# Patient Record
Sex: Female | Born: 1970 | Race: White | Hispanic: No | Marital: Single | Smoking: Former smoker
Health system: Southern US, Community
[De-identification: ages and names within clinical notes are randomized; demographics above are authoritative.]

## PROBLEM LIST (undated history)

## (undated) DIAGNOSIS — I10 Essential (primary) hypertension: Secondary | ICD-10-CM

## (undated) DIAGNOSIS — E119 Type 2 diabetes mellitus without complications: Secondary | ICD-10-CM

## (undated) HISTORY — PX: TONSILLECTOMY: SUR1361

---

## 2010-03-27 ENCOUNTER — Emergency Department: Payer: Self-pay | Admitting: Emergency Medicine

## 2010-04-10 ENCOUNTER — Ambulatory Visit: Payer: Self-pay | Admitting: Internal Medicine

## 2010-04-17 ENCOUNTER — Ambulatory Visit: Payer: Self-pay | Admitting: Gastroenterology

## 2010-04-18 ENCOUNTER — Ambulatory Visit: Payer: Self-pay | Admitting: Gastroenterology

## 2010-04-19 ENCOUNTER — Ambulatory Visit: Payer: Self-pay | Admitting: Gastroenterology

## 2010-04-26 ENCOUNTER — Ambulatory Visit: Payer: Self-pay | Admitting: Gastroenterology

## 2011-06-25 ENCOUNTER — Ambulatory Visit: Payer: Self-pay | Admitting: Unknown Physician Specialty

## 2013-02-16 IMAGING — CR PARANASAL SINUSES - COMPLETE 3 + VIEW
1 series · 6 of 6 positions shown · non-contrast
Comparison: none

REASON FOR EXAM: acute max
COMMENTS:

[Series 1: view not recorded · 0.17mm/px · 6 of 6 slices shown]
[im 1/6]
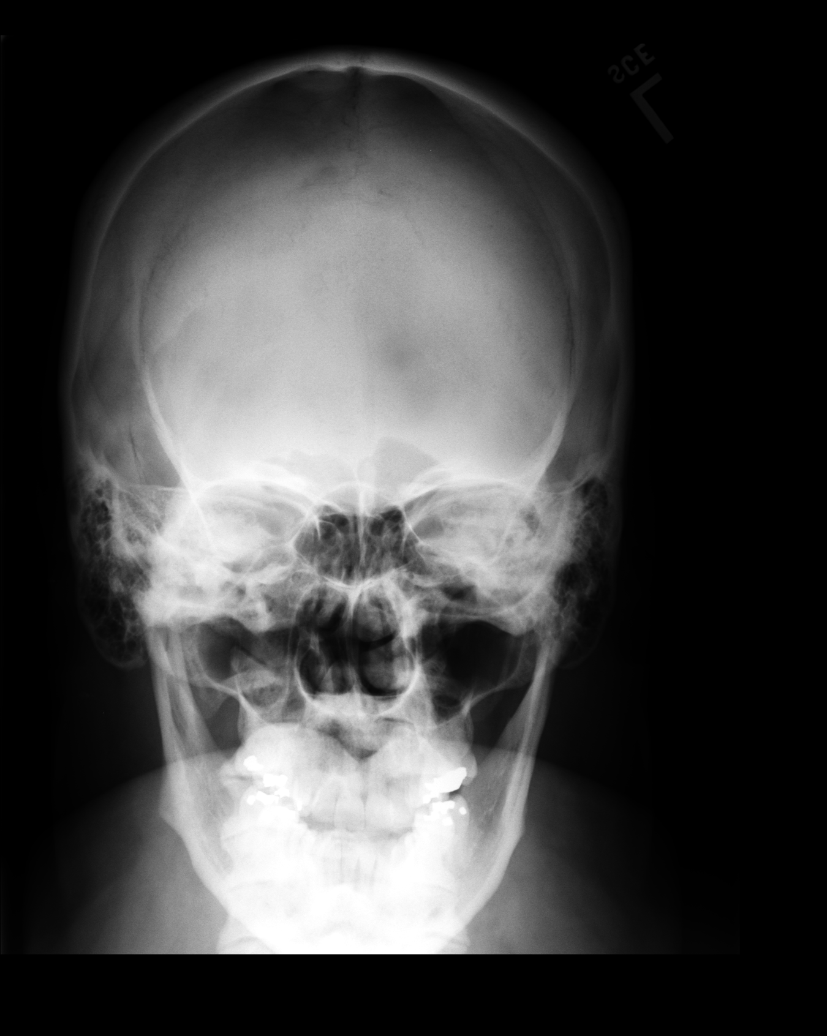
[im 2/6]
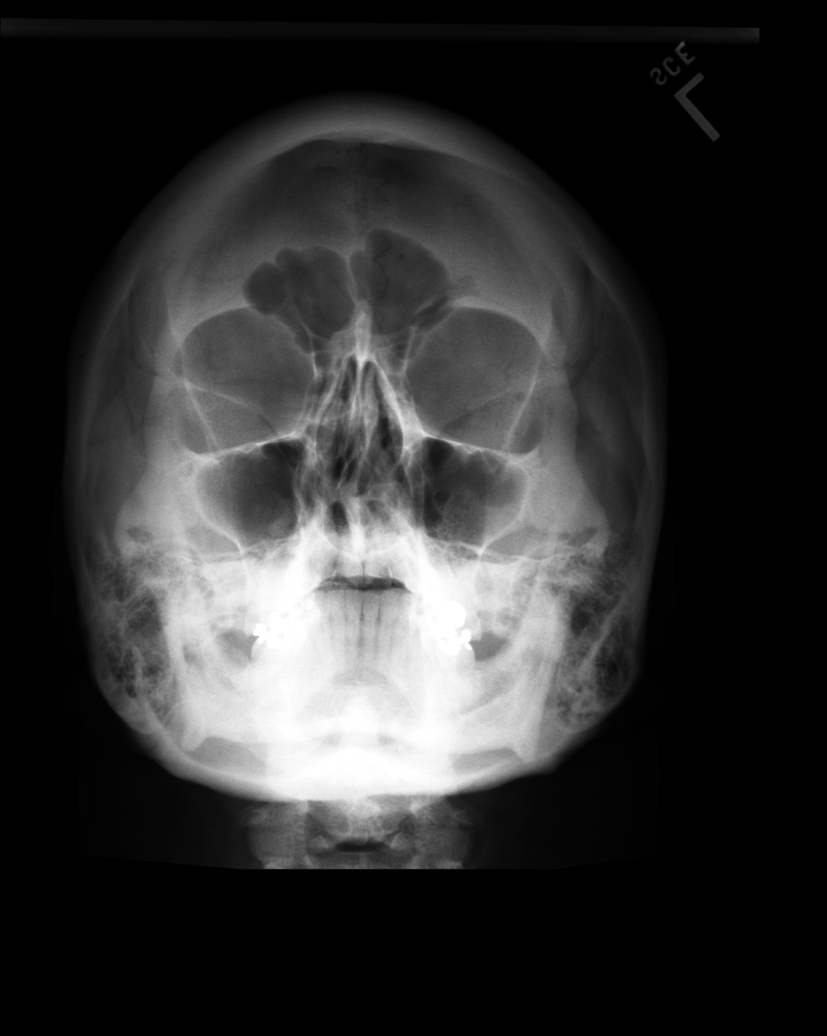
[im 3/6]
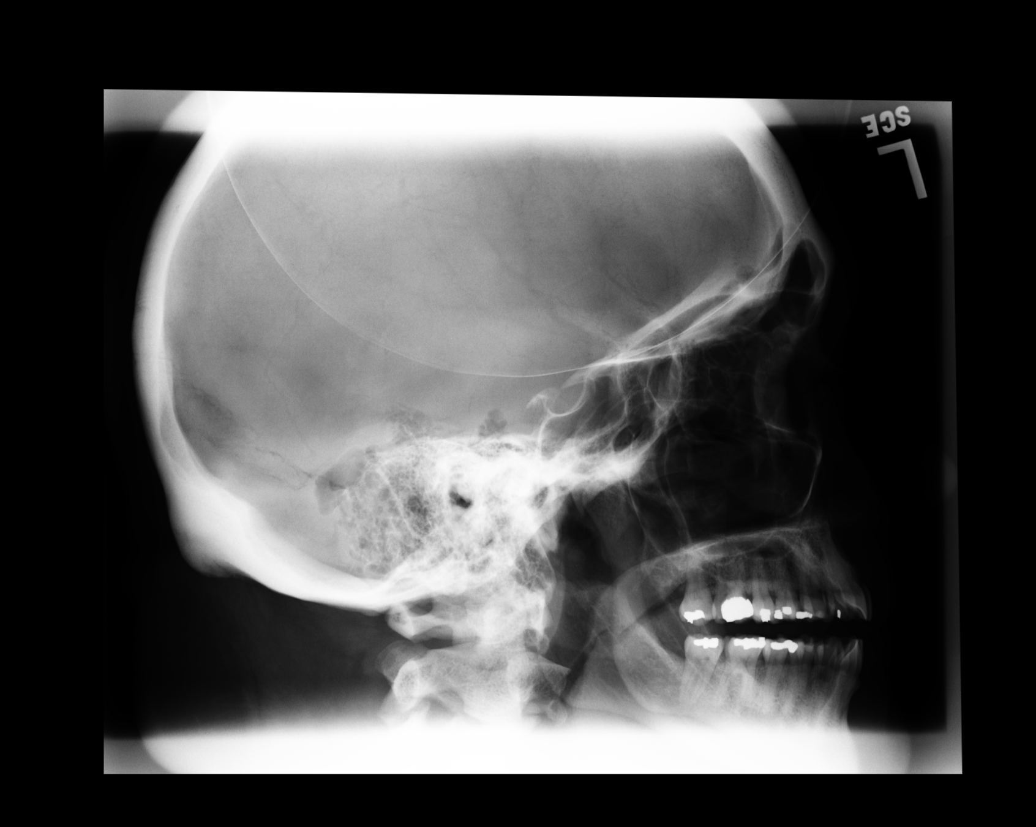
[im 4/6]
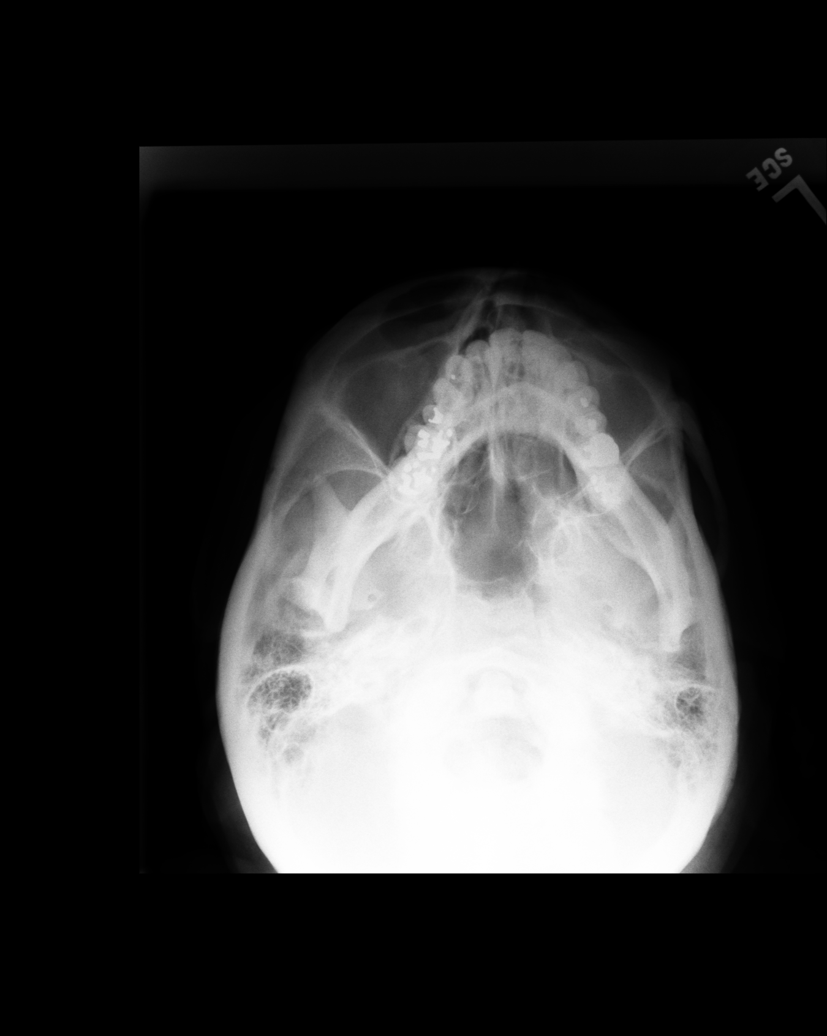
[im 5/6]
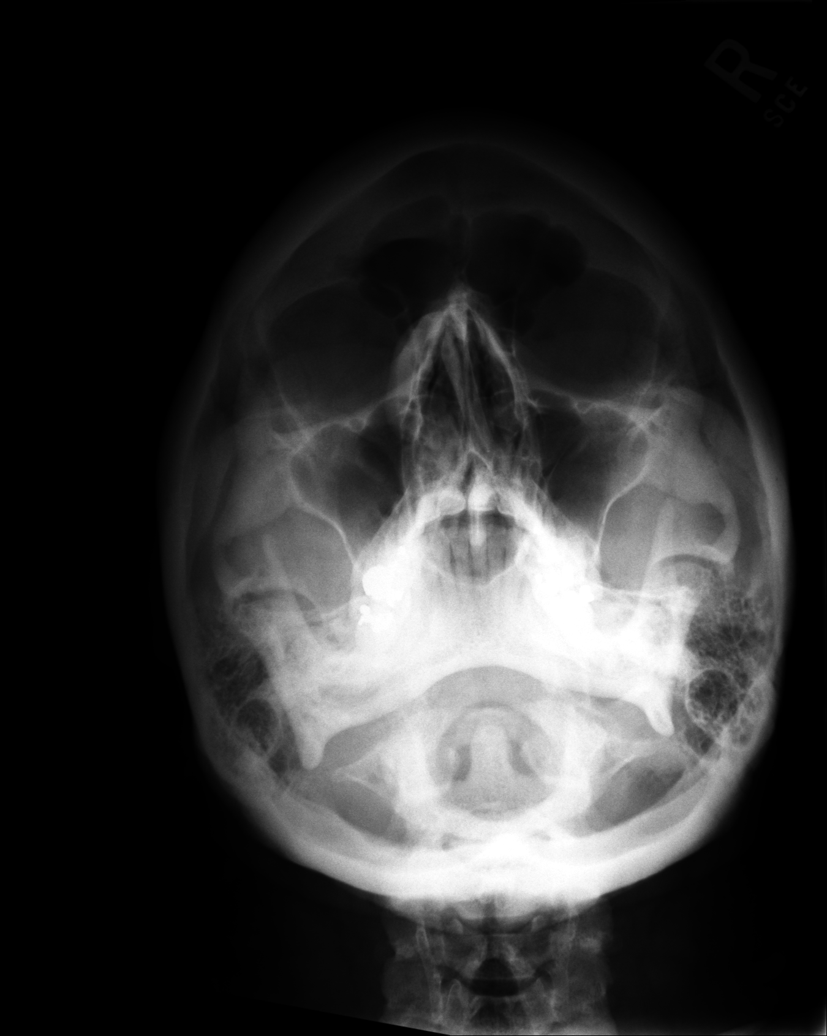
[im 6/6]
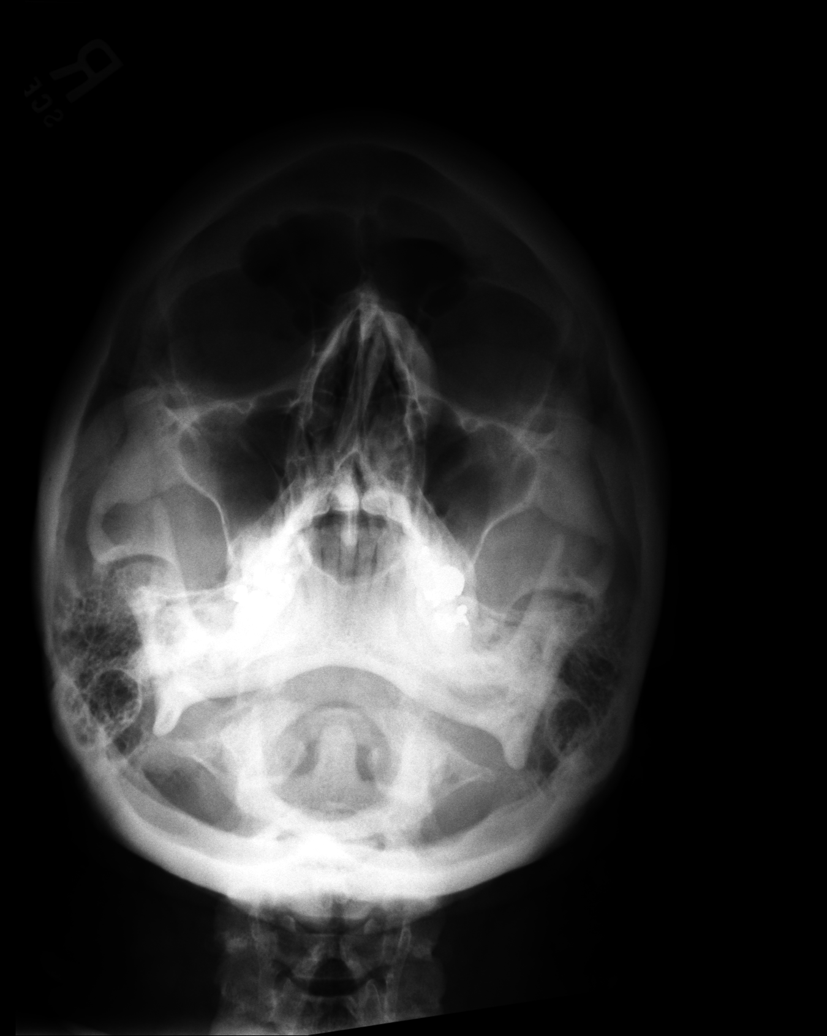

[6 of 6 positions shown; findings below may reference images not displayed]

PROCEDURE:     DXR - DXR SINUSES PARANASAL COMPLETE  - June 25, 2011  [DATE]

RESULT:     There is mild nasal septal deviation toward the right. No
air-fluid level is seen within the paranasal sinuses. There is no
significant mucosal thickening evident. No bony destruction is demonstrated.
The mastoid air cells show grossly normal aeration.
IMPRESSION: 1. No radiographic evidence of acute sinusitis.

## 2022-09-15 ENCOUNTER — Encounter
Admission: EM | Disposition: A | Discharge status: Other Acute Inpatient Hospital | Payer: Self-pay | Source: Home / Self Care | Attending: Pulmonary Disease

## 2022-09-15 ENCOUNTER — Other Ambulatory Visit: Payer: Self-pay

## 2022-09-15 ENCOUNTER — Inpatient Hospital Stay
Admission: EM | Admit: 2022-09-15 | Discharge: 2022-09-16 | DRG: 853 | Disposition: A | Discharge status: Other Acute Inpatient Hospital | Payer: Self-pay | Attending: Pulmonary Disease | Admitting: Pulmonary Disease

## 2022-09-15 ENCOUNTER — Emergency Department: Payer: Self-pay

## 2022-09-15 DIAGNOSIS — L02214 Cutaneous abscess of groin: Secondary | ICD-10-CM | POA: Diagnosis present

## 2022-09-15 DIAGNOSIS — R6520 Severe sepsis without septic shock: Secondary | ICD-10-CM | POA: Diagnosis present

## 2022-09-15 DIAGNOSIS — M726 Necrotizing fasciitis: Secondary | ICD-10-CM

## 2022-09-15 DIAGNOSIS — E1165 Type 2 diabetes mellitus with hyperglycemia: Secondary | ICD-10-CM

## 2022-09-15 DIAGNOSIS — K439 Ventral hernia without obstruction or gangrene: Secondary | ICD-10-CM | POA: Diagnosis present

## 2022-09-15 DIAGNOSIS — A419 Sepsis, unspecified organism: Principal | ICD-10-CM

## 2022-09-15 DIAGNOSIS — K760 Fatty (change of) liver, not elsewhere classified: Secondary | ICD-10-CM | POA: Diagnosis present

## 2022-09-15 DIAGNOSIS — Z881 Allergy status to other antibiotic agents status: Secondary | ICD-10-CM

## 2022-09-15 DIAGNOSIS — N200 Calculus of kidney: Secondary | ICD-10-CM | POA: Diagnosis present

## 2022-09-15 DIAGNOSIS — J95821 Acute postprocedural respiratory failure: Secondary | ICD-10-CM | POA: Diagnosis present

## 2022-09-15 DIAGNOSIS — E1110 Type 2 diabetes mellitus with ketoacidosis without coma: Secondary | ICD-10-CM | POA: Diagnosis present

## 2022-09-15 DIAGNOSIS — Z6841 Body Mass Index (BMI) 40.0 and over, adult: Secondary | ICD-10-CM

## 2022-09-15 DIAGNOSIS — R531 Weakness: Secondary | ICD-10-CM | POA: Diagnosis present

## 2022-09-15 HISTORY — PX: INCISION AND DRAINAGE OF WOUND: SHX1803

## 2022-09-15 LAB — CBC WITH DIFFERENTIAL/PLATELET
Abs Immature Granulocytes: 0.13 10*3/uL — ABNORMAL HIGH (ref 0.00–0.07)
Basophils Absolute: 0.1 10*3/uL (ref 0.0–0.1)
Basophils Relative: 1 %
Eosinophils Absolute: 0.1 10*3/uL (ref 0.0–0.5)
Eosinophils Relative: 1 %
HCT: 47.8 % — ABNORMAL HIGH (ref 36.0–46.0)
Hemoglobin: 15.3 g/dL — ABNORMAL HIGH (ref 12.0–15.0)
Immature Granulocytes: 1 %
Lymphocytes Relative: 8 %
Lymphs Abs: 1 10*3/uL (ref 0.7–4.0)
MCH: 28.7 pg (ref 26.0–34.0)
MCHC: 32 g/dL (ref 30.0–36.0)
MCV: 89.7 fL (ref 80.0–100.0)
Monocytes Absolute: 0.8 10*3/uL (ref 0.1–1.0)
Monocytes Relative: 6 %
Neutro Abs: 10.7 10*3/uL — ABNORMAL HIGH (ref 1.7–7.7)
Neutrophils Relative %: 83 %
Platelets: 420 10*3/uL — ABNORMAL HIGH (ref 150–400)
RBC: 5.33 MIL/uL — ABNORMAL HIGH (ref 3.87–5.11)
RDW: 12.4 % (ref 11.5–15.5)
Smear Review: NORMAL
WBC: 12.9 10*3/uL — ABNORMAL HIGH (ref 4.0–10.5)
nRBC: 0 % (ref 0.0–0.2)

## 2022-09-15 LAB — COMPREHENSIVE METABOLIC PANEL
ALT: 26 U/L (ref 0–44)
AST: 30 U/L (ref 15–41)
Albumin: 2.7 g/dL — ABNORMAL LOW (ref 3.5–5.0)
Alkaline Phosphatase: 144 U/L — ABNORMAL HIGH (ref 38–126)
Anion gap: 20 — ABNORMAL HIGH (ref 5–15)
BUN: 28 mg/dL — ABNORMAL HIGH (ref 6–20)
CO2: 22 mmol/L (ref 22–32)
Calcium: 9.9 mg/dL (ref 8.9–10.3)
Chloride: 99 mmol/L (ref 98–111)
Creatinine, Ser: 0.88 mg/dL (ref 0.44–1.00)
GFR, Estimated: >60 mL/min (ref >60)
Glucose, Bld: 815 mg/dL (ref 70–99)
Potassium: 4.7 mmol/L (ref 3.5–5.1)
Sodium: 141 mmol/L (ref 135–145)
Total Bilirubin: 1.7 mg/dL — ABNORMAL HIGH (ref 0.3–1.2)
Total Protein: 9.2 g/dL — ABNORMAL HIGH (ref 6.5–8.1)

## 2022-09-15 LAB — URINALYSIS, COMPLETE (UACMP) WITH MICROSCOPIC
Bacteria, UA: NONE SEEN
Bilirubin Urine: NEGATIVE
Glucose, UA: >=500 mg/dL — AB
Hgb urine dipstick: NEGATIVE
Ketones, ur: 20 mg/dL — AB
Leukocytes,Ua: NEGATIVE
Nitrite: NEGATIVE
Protein, ur: NEGATIVE mg/dL
Specific Gravity, Urine: 1.031 — ABNORMAL HIGH (ref 1.005–1.030)
Squamous Epithelial / HPF: NONE SEEN (ref 0–5)
pH: 6 (ref 5.0–8.0)

## 2022-09-15 LAB — BLOOD GAS, VENOUS
Acid-Base Excess: 0.5 mmol/L (ref 0.0–2.0)
Bicarbonate: 26 mmol/L (ref 20.0–28.0)
O2 Saturation: 68.2 %
Patient temperature: 37
pCO2, Ven: 44 mmHg (ref 44–60)
pH, Ven: 7.38 (ref 7.25–7.43)
pO2, Ven: 46 mmHg — ABNORMAL HIGH (ref 32–45)

## 2022-09-15 LAB — CBG MONITORING, ED: Glucose-Capillary: >600 mg/dL (ref 70–99)

## 2022-09-15 LAB — LACTIC ACID, PLASMA: Lactic Acid, Venous: 3.3 mmol/L (ref 0.5–1.9)

## 2022-09-15 LAB — TROPONIN I (HIGH SENSITIVITY): Troponin I (High Sensitivity): 15 ng/L (ref <18)

## 2022-09-15 SURGERY — IRRIGATION AND DEBRIDEMENT WOUND
Anesthesia: General | Laterality: Bilateral

## 2022-09-15 MED ORDER — SODIUM CHLORIDE 0.9 % IV SOLN: 2.0000 g | Freq: Once | INTRAVENOUS | Status: DC

## 2022-09-15 MED ORDER — POLYETHYLENE GLYCOL 3350 17 G PO PACK: 17.0000 g | PACK | Freq: Every day | ORAL | Status: DC | PRN

## 2022-09-15 MED ORDER — DEXTROSE 50 % IV SOLN: 0.0000 mL | INTRAVENOUS | Status: DC | PRN

## 2022-09-15 MED ORDER — INSULIN REGULAR(HUMAN) IN NACL 100-0.9 UT/100ML-% IV SOLN
INTRAVENOUS | Status: DC
  Administered 2022-09-15: 18 [IU]/h via INTRAVENOUS
  Administered 2022-09-16: 22 [IU]/h via INTRAVENOUS
  Filled 2022-09-15 (×3): qty 100

## 2022-09-15 MED ORDER — DOCUSATE SODIUM 100 MG PO CAPS: 100.0000 mg | ORAL_CAPSULE | Freq: Two times a day (BID) | ORAL | Status: DC | PRN

## 2022-09-15 MED ORDER — IOHEXOL 300 MG/ML  SOLN
100.0000 mL | Freq: Once | INTRAMUSCULAR | Status: AC | PRN
  Administered 2022-09-15: 100 mL via INTRAVENOUS

## 2022-09-15 MED ORDER — SODIUM CHLORIDE 0.9 % IV SOLN
2.0000 g | Freq: Once | INTRAVENOUS | Status: AC
  Administered 2022-09-16: 2 g via INTRAVENOUS
  Filled 2022-09-15: qty 12.5

## 2022-09-15 MED ORDER — LINEZOLID 600 MG/300ML IV SOLN
600.0000 mg | Freq: Two times a day (BID) | INTRAVENOUS | Status: DC
  Administered 2022-09-16 (×2): 600 mg via INTRAVENOUS
  Filled 2022-09-15 (×3): qty 300

## 2022-09-15 MED ORDER — SODIUM CHLORIDE 0.9 % IV BOLUS
1000.0000 mL | Freq: Once | INTRAVENOUS | Status: AC
  Administered 2022-09-15: 1000 mL via INTRAVENOUS

## 2022-09-15 MED ORDER — VANCOMYCIN HCL IN DEXTROSE 1-5 GM/200ML-% IV SOLN
1000.0000 mg | Freq: Once | INTRAVENOUS | Status: AC
  Administered 2022-09-16: 1000 mg via INTRAVENOUS
  Filled 2022-09-15: qty 200

## 2022-09-15 MED ORDER — DEXTROSE IN LACTATED RINGERS 5 % IV SOLN: INTRAVENOUS | Status: DC

## 2022-09-15 MED ORDER — METRONIDAZOLE 500 MG/100ML IV SOLN
500.0000 mg | Freq: Once | INTRAVENOUS | Status: AC
  Administered 2022-09-16: 500 mg via INTRAVENOUS
  Filled 2022-09-15 (×2): qty 100

## 2022-09-15 SURGICAL SUPPLY — 34 items
BLADE CLIPPER SURG (BLADE) ×1 IMPLANT
BLADE SURG 15 STRL LF DISP TIS (BLADE) ×1 IMPLANT
BLADE SURG 15 STRL SS (BLADE) ×1
BNDG GAUZE DERMACEA FLUFF 4 (GAUZE/BANDAGES/DRESSINGS) IMPLANT
CHLORAPREP W/TINT 26 (MISCELLANEOUS) ×1 IMPLANT
DRAIN PENROSE 12X.25 LTX STRL (MISCELLANEOUS) IMPLANT
DRAIN PENROSE 5/8X18 LTX STRL (DRAIN) IMPLANT
DRAPE LAPAROTOMY 77X122 PED (DRAPES) ×1 IMPLANT
ELECT REM PT RETURN 9FT ADLT (ELECTROSURGICAL) ×1
ELECTRODE REM PT RTRN 9FT ADLT (ELECTROSURGICAL) ×1 IMPLANT
GAUZE 4X4 16PLY ~~LOC~~+RFID DBL (SPONGE) ×1 IMPLANT
GAUZE SPONGE 4X4 12PLY STRL (GAUZE/BANDAGES/DRESSINGS) IMPLANT
GLOVE BIO SURGEON STRL SZ 6.5 (GLOVE) ×1 IMPLANT
GLOVE BIOGEL PI IND STRL 6.5 (GLOVE) ×1 IMPLANT
GOWN STRL REUS W/ TWL LRG LVL3 (GOWN DISPOSABLE) ×2 IMPLANT
GOWN STRL REUS W/TWL LRG LVL3 (GOWN DISPOSABLE) ×2
KIT TURNOVER KIT A (KITS) ×1 IMPLANT
MANIFOLD NEPTUNE II (INSTRUMENTS) ×1 IMPLANT
NEEDLE HYPO 22GX1.5 SAFETY (NEEDLE) ×1 IMPLANT
NS IRRIG 1000ML POUR BTL (IV SOLUTION) ×1 IMPLANT
PACK BASIN MINOR ARMC (MISCELLANEOUS) ×1 IMPLANT
PAD ABD DERMACEA PRESS 5X9 (GAUZE/BANDAGES/DRESSINGS) IMPLANT
PULSAVAC PLUS IRRIG FAN TIP (DISPOSABLE) ×1
SOL PREP PVP 2OZ (MISCELLANEOUS) ×2
SOLUTION PREP PVP 2OZ (MISCELLANEOUS) ×2 IMPLANT
SPONGE T-LAP 18X18 ~~LOC~~+RFID (SPONGE) ×1 IMPLANT
SUT ETHILON 3-0 FS-10 30 BLK (SUTURE)
SUTURE EHLN 3-0 FS-10 30 BLK (SUTURE) IMPLANT
SWAB CULTURE AMIES ANAERIB BLU (MISCELLANEOUS) IMPLANT
SYR 10ML LL (SYRINGE) ×1 IMPLANT
SYR BULB IRRIG 60ML STRL (SYRINGE) ×1 IMPLANT
TIP FAN IRRIG PULSAVAC PLUS (DISPOSABLE) IMPLANT
TRAP FLUID SMOKE EVACUATOR (MISCELLANEOUS) ×1 IMPLANT
WATER STERILE IRR 500ML POUR (IV SOLUTION) ×1 IMPLANT

## 2022-09-15 NOTE — H&P (Incomplete)
NAME:  Tracy Woods, MRN:  [id number], DOB:  17-Oct-1971, LOS: 1 ADMISSION DATE:  09/15/2022, CONSULTATION DATE:  09/15/22 REFERRING MD: Arta Silence  CHIEF COMPLAINT:  Abscess in the groin area and generalized weakness    HPI  51  y.o morbidly obese female who has not been to the doctor for over 5 years with no significant PMH on record , presenting to the ED with chief complaints of abscess in the groin area and generalized weakness x 1 week.   ED Course: Initial vital signs showed HR of 102 beats/minute, BP 133/114 mm Hg, the RR 18 breaths/minute, and the oxygen saturation 95 % on RA and a temperature of 98.77F (36.7C). .   Pertinent Labs/Diagnostics Findings: Chemistry:Na+/ K+:  141/4.7 Glucose: 815  BUN/Cr.:28/0.88 CBC: WBC: 12.9 Other Lab findings:   PCT: Lactic acid: 3.3 Troponin:15 Venous Blood Gas result:  pO2 46; pCO2 44; pH 7.38;  HCO3 26.0, %O2 Sat 68.2.  Imaging: CTA Chest> CT Abd/pelvis>Findings consistent with necrotizing fasciitis involving the perineum and lower abdominal pannus. Extensive soft tissue gas with subcutaneous edema and small right lower abdominal pannus fluid collection..  Patient given 30 cc/kg of fluids and started on broad-spectrum antibiotics Vanco cefepime and Flagyl for sepsis secondary to intrabdominal infection as above. She was also started on Insulin gtt per hyperglycemic protocol. Given findings as above consistent with necrotizing fasciitis involving the perineum and lower abdomen, general surgery was consulted STAT. After further discussion it was determined that patient would be better served at a tertiary center.  General surgery reached out to [facility] accepting physician who recommended patient be debrided fast prior to consideration for transfer.  Patient was taken to the OR urgently for debridement and transferred to the ICU postop.  (Sepsis reassessment completed). PCCM consulted for further management.  Past Medical History  No known  Medical History likely undiagnosed uncontrolled DM  Significant Hospital Events   11/5; Admit with sepsis secondary to Necrotizing fasciitis of the abdominal wall and perineum s/p excisional debridement of abdominal wall and perineum 11/6: Transfered to ICU post op. PCCM consulted    Consults:  General Surgery  Procedures:  11/6:excisional debridement of Necrotizing fasciitis  of abdominal wall and perineum   Significant Diagnostic Tests:  11/5: Chest Xray> IMPRESSION: 1. Low lung volumes with bronchovascular crowding versus vascular congestion. 2. Prominent heart size likely accentuated by technique.  11/5: CTA abdomen and pelvis> IMPRESSION: 1. Findings consistent with necrotizing fasciitis involving the perineum and lower abdominal pannus. Extensive soft tissue gas with subcutaneous edema and small right lower abdominal pannus fluid collection. Urgent surgical consultation is needed. 2. Moderate-sized umbilical/lower ventral abdominal wall hernia containing small bowel, no associated obstruction. 3. Hepatosplenomegaly and hepatic steatosis. 4. Absent renal excretion on delayed phase imaging suggesting underlying renal dysfunction. 5. Nonobstructing left renal stone.   Micro Data:  11/6: Blood culture x2> 11/6: MRSA PCR>>   Antimicrobials:  Zosyn 11/6> Linezolid 11/5>  OBJECTIVE  Blood pressure 100/69, pulse 99, temperature 97.7 F (36.5 C), resp. rate (!) 21, weight (!) 173.2 kg, SpO2 100 %.    FiO2 (%):  [40 %-60 %] 40 %   Intake/Output Summary (Last 24 hours) at 09/16/2022 0531 Last data filed at 09/16/2022 0500 Gross per 24 hour  Intake 3316.35 ml  Output 1440 ml  Net 1876.35 ml   Filed Weights   09/16/22 0344  Weight: (!) 173.2 kg    Physical Examination  GENERAL: 51 year-old critically ill patient lying in the bed  intubated and sedated EYES: Pupils equal, round, reactive to light and accommodation. No scleral icterus. Extraocular muscles intact.   HEENT: Head atraumatic, normocephalic. Oropharynx and nasopharynx clear.  NECK:  Supple, no jugular venous distention. No thyroid enlargement, no tenderness.  LUNGS: Normal breath sounds bilaterally, no wheezing, rales,rhonchi or crepitation. No use of accessory muscles of respiration.  CARDIOVASCULAR: S1, S2 normal. No murmurs, rubs, or gallops.  ABDOMEN: Soft, nontender, nondistended. Bowel sounds present. No organomegaly or mass.  EXTREMITIES: Upper and lower extremities are atraumatic in appearance without tenderness or deformity.  Bilateral lower extremity dependent edema. Capillary refill is less than 3 seconds in all extremities. Pulses palpable.  NEUROLOGIC: The patient is intubated and sedated.  Unable to assess motor function or sensation at this time. Cranial nerves are intact. Gait deferred PSYCHIATRIC: The patient is intubated and sedated SKIN:           Labs/imaging that I havepersonally reviewed  (right click and "Reselect all SmartList Selections" daily)     Labs   CBC: Recent Labs  Lab 09/15/22 1948 09/16/22 0435  WBC 12.9* 17.1*  NEUTROABS 10.7*  --   HGB 15.3* 13.2  HCT 47.8* 40.4  MCV 89.7 89.0  PLT 420* 420*    Basic Metabolic Panel: Recent Labs  Lab 09/15/22 1948 09/16/22 0435  NA 141 148*  K 4.7 4.2  CL 99 110  CO2 22 27  GLUCOSE 815* 492*  BUN 28* 29*  CREATININE 0.88 0.87  CALCIUM 9.9 9.1  MG  --  2.3  PHOS  --  5.2*   GFR: CrCl cannot be calculated (Unknown ideal weight.). Recent Labs  Lab 09/15/22 1948 09/15/22 2014 09/16/22 0003 09/16/22 0010 09/16/22 0435  PROCALCITON  --   --   --  0.50 0.90  WBC 12.9*  --   --   --  17.1*  LATICACIDVEN  --  3.3* 2.5*  --   --     Liver Function Tests: Recent Labs  Lab 09/15/22 1948  AST 30  ALT 26  ALKPHOS 144*  BILITOT 1.7*  PROT 9.2*  ALBUMIN 2.7*   No results for input(s): "LIPASE", "AMYLASE" in the last 168 hours. No results for input(s): "AMMONIA" in the last 168  hours.  ABG    Component Value Date/Time   PHART 7.38 09/16/2022 0518   PCO2ART 50 (H) 09/16/2022 0518   PO2ART 189 (H) 09/16/2022 0518   HCO3 29.6 (H) 09/16/2022 0518   ACIDBASEDEF 0.8 09/16/2022 0152   O2SAT 100 09/16/2022 0518     Coagulation Profile: Recent Labs  Lab 09/16/22 0010  INR 1.3*    Cardiac Enzymes: No results for input(s): "CKTOTAL", "CKMB", "CKMBINDEX", "TROPONINI" in the last 168 hours.  HbA1C: No results found for: "HGBA1C"  CBG: Recent Labs  Lab 09/16/22 0327 09/16/22 0354 09/16/22 0428 09/16/22 0457 09/16/22 0528  GLUCAP 446* 437* 432* 451* 425*    Review of Systems:   UNABLE TO OBTAIN PATIENT IS INTUBATED AND SEDATED  Past Medical History  She,  has no past medical history on file.   Surgical History   None   Social History      Family History   Her family history is not on file.   Allergies Allergies  Allergen Reactions   Amoxicillin Rash     Home Medications  Prior to Admission medications   Not on File  Scheduled Meds:  Chlorhexidine Gluconate Cloth  6 each Topical Daily   docusate  100 mg Per  Tube BID   mouth rinse  15 mL Mouth Rinse Q2H   polyethylene glycol  17 g Per Tube Daily   Continuous Infusions:  sodium chloride 10 mL/hr at 09/16/22 0500   dextrose 5% lactated ringers Stopped (09/16/22 0333)   fentaNYL infusion INTRAVENOUS 150 mcg/hr (09/16/22 0500)   insulin 22 Units/hr (09/16/22 0500)   lactated ringers 125 mL/hr at 09/16/22 0500   linezolid (ZYVOX) IV     piperacillin-tazobactam (ZOSYN)  IV 3.375 g (09/16/22 0511)   propofol (DIPRIVAN) infusion 20 mcg/kg/min (09/16/22 0500)   PRN Meds:.sodium chloride, dextrose, docusate sodium, mouth rinse, polyethylene glycol   Active Hospital Problem list     Assessment & Plan:  Severe Sepsis Secondary to Necrotizing fasciitis of the abdominal wall and perineum  Initial interventions/workup included: 3 L of NS/LR & Cefepime/ Vancomycin/ Metronidazole meets  SIRS criteria:  -Supplemental oxygen as needed, to maintain SpO2 > 90% -F/u cultures, trend lactic/ PCT -Monitor WBC/ fever curve -IV antibiotics:will add Zosyn to Linezolid  -IVF hydration as needed -Pressors for MAP goal >65 -Strict I/O's  Mechanical Ventilatory Support requirement Post Operative -Wean PEEP and FiO2 for sats greater than 90% -Plateau pressures less than 30 cm H20 -Continue lung protective strategies  -SBT once parameters mer -VAP bundle in place -Intermittent chest x-ray & ABG -wean sedation/analgesia for RASS goal 0  Necrotizing fasciitis of the abdominal wall and perineum  S/p excisional debridement of abdominal wall and perineum POD # 0 -Wound care per General surgery -General Surgery following, Per Dr. Peyton Najjar, case was discussed with accepting physician at [facility] pending transfer post debridement and stabilization for continued wound care management and or revision.  Diabetic Ketoacidosis  in the setting of severe sepsis as above Likely undiagnosed uncontrolled DM -Will check Lipase for pancreatitis -Received Insulin (regular) 0.1u/kg (~10 units) IV x1. Continue Insulin drip, DKA protocol -Keep NPO -Glucose: q1h to titrate insulin -Lab monitoring: 4h BMP+Phosphorus+pH -Aggressive  volume repletion  -Goal to normalize anion gap  -Diabetes coordinator consult    Best practice:  Diet:  Tube Feed  Pain/Anxiety/Delirium protocol (if indicated): Yes (RASS goal 0) VAP protocol (if indicated): Yes DVT prophylaxis: LMWH GI prophylaxis: PPI Glucose control:  Insulin gtt Central venous access:  N/A Arterial line:  Yes, and it is still needed Foley:  Yes, and it is still needed Mobility:  bed rest  PT consulted: N/A Last date of multidisciplinary goals of care discussion [11/5] Code Status:  full code Disposition: ICU   = Goals of Care = Code Status Order: FULL  Wishes to pursue full aggressive treatment and intervention options, including CPR and  intubation, but goals of care will be addressed on going with family if that should become necessary.  Critical care time: 45 minutes       Rufina Falco, DNP, CCRN, FNP-C, AGACNP-BC Acute Care Nurse Practitioner [facility]  PCCM on call pager [phone] until 7 am

## 2022-09-15 NOTE — ED Provider Notes (Signed)
[facility] Provider Note    Event Date/Time   First MD Initiated Contact with Patient 09/15/22 1916     (approximate)   History   Abscess   HPI  Tracy Woods is a 51 y.o. female with a history of morbid obesity who presents due to swelling to her right groin area for the last few weeks, along with generalized weakness for the last several weeks and inability to get out of bed.  The patient reports nausea and vomiting as well and she has shortness of breath with minimal exertion.  I attempted to review the past medical records but she has no prior records here.  The patient states that she has not seen any doctor in approximately 6 years.  She states that she lives with her son.  She spends her time mostly in bed and up until recently has been able to transfer to the bathroom without assistance, but is no longer able to do so.  She is not on any medications.  She states she presumes she may be a diabetic but does not know.    Physical Exam   Triage Vital Signs: ED Triage Vitals  Enc Vitals Group     BP 09/15/22 1857 (!) 133/114     Pulse Rate 09/15/22 1857 (!) 102     Resp 09/15/22 1857 18     Temp 09/15/22 1857 98.1 F (36.7 C)     Temp Source 09/15/22 1857 Oral     SpO2 09/15/22 1857 95 %     Weight --      Height --      Head Circumference --      Peak Flow --      Pain Score 09/15/22 1852 (S) 10     Pain Loc --      Pain Edu? --      Excl. in GC? --     Most recent vital signs: Vitals:   09/15/22 2140 09/15/22 2300  BP: 137/81 130/70  Pulse: (!) 104 (!) 103  Resp: (!) 39 (!) 29  Temp:    SpO2: 96% 96%     General: Alert and oriented, chronically ill-appearing, no distress. CV:  Good peripheral perfusion.  Resp:  Normal effort.  Lungs CTAB. Abd:  Obese abdomen.  No focal tenderness.  No distention.  Other:  Very dry mucous membranes.  EOMI.  Motor intact in all extremities.  Suprapubic area with large pannus including an area  that appears indurated and slightly erythematous but with no abnormal warmth.  No open wounds or drainage.   ED Results / Procedures / Treatments   Labs (all labs ordered are listed, but only abnormal results are displayed) Labs Reviewed  COMPREHENSIVE METABOLIC PANEL - Abnormal; Notable for the following components:      Result Value   Glucose, Bld 815 (*)    BUN 28 (*)    Total Protein 9.2 (*)    Albumin 2.7 (*)    Alkaline Phosphatase 144 (*)    Total Bilirubin 1.7 (*)    Anion gap 20 (*)    All other components within normal limits  CBC WITH DIFFERENTIAL/PLATELET - Abnormal; Notable for the following components:   WBC 12.9 (*)    RBC 5.33 (*)    Hemoglobin 15.3 (*)    HCT 47.8 (*)    Platelets 420 (*)    Neutro Abs 10.7 (*)    Abs Immature Granulocytes 0.13 (*)    All  other components within normal limits  LACTIC ACID, PLASMA - Abnormal; Notable for the following components:   Lactic Acid, Venous 3.3 (*)    All other components within normal limits  BLOOD GAS, VENOUS - Abnormal; Notable for the following components:   pO2, Ven 46 (*)    All other components within normal limits  URINALYSIS, COMPLETE (UACMP) WITH MICROSCOPIC - Abnormal; Notable for the following components:   Color, Urine STRAW (*)    APPearance CLEAR (*)    Specific Gravity, Urine 1.031 (*)    Glucose, UA >=500 (*)    Ketones, ur 20 (*)    All other components within normal limits  CBG MONITORING, ED - Abnormal; Notable for the following components:   Glucose-Capillary >600 (*)    All other components within normal limits  LACTIC ACID, PLASMA  URINALYSIS, ROUTINE W REFLEX MICROSCOPIC  OSMOLALITY  HIV ANTIBODY (ROUTINE TESTING W REFLEX)  PROTIME-INR  APTT  CBC  BASIC METABOLIC PANEL  MAGNESIUM  PHOSPHORUS  PROCALCITONIN  PROCALCITONIN  BETA-HYDROXYBUTYRIC ACID  TYPE AND SCREEN  TROPONIN I (HIGH SENSITIVITY)  TROPONIN I (HIGH SENSITIVITY)     EKG  ED ECG REPORT I, Arta Silence,  the attending physician, personally viewed and interpreted this ECG.  Date: 09/15/2022 EKG Time: 2055 Rate: 108 Rhythm: Sinus tachycardia QRS Axis: normal Intervals: normal ST/T Wave abnormalities: normal Narrative Interpretation: no evidence of acute ischemia   RADIOLOGY  Chest x-ray: I independently viewed and interpreted the images; there is no focal consolidation or obvious edema  CT abdomen/pelvis: I independently viewed and interpreted the images; there is free air in the soft tissues of the lower abdominal pannus.  Radiology report indicates necrotizing fasciitis.   PROCEDURES:  Critical Care performed: Yes, see critical care procedure note(s)  .Critical Care  Performed by: Arta Silence, MD Authorized by: Arta Silence, MD   Critical care provider statement:    Critical care time (minutes):  60   Critical care time was exclusive of:  Separately billable procedures and treating other patients   Critical care was necessary to treat or prevent imminent or life-threatening deterioration of the following conditions:  Sepsis and metabolic crisis   Critical care was time spent personally by me on the following activities:  Development of treatment plan with patient or surrogate, discussions with consultants, evaluation of patient's response to treatment, examination of patient, ordering and review of laboratory studies, ordering and review of radiographic studies, ordering and performing treatments and interventions, pulse oximetry, re-evaluation of patient's condition, review of old charts and obtaining history from patient or surrogate   Care discussed with: admitting provider      MEDICATIONS ORDERED IN ED: Medications  insulin regular, human (MYXREDLIN) 100 units/ 100 mL infusion (18 Units/hr Intravenous New Bag/Given 09/15/22 2329)  dextrose 5 % in lactated ringers infusion (has no administration in time range)  dextrose 50 % solution 0-50 mL (has no  administration in time range)  vancomycin (VANCOCIN) IVPB 1000 mg/200 mL premix (1,000 mg Intravenous New Bag/Given 09/16/22 0014)  docusate sodium (COLACE) capsule 100 mg (has no administration in time range)  polyethylene glycol (MIRALAX / GLYCOLAX) packet 17 g (has no administration in time range)  ceFEPIme (MAXIPIME) 2 g in sodium chloride 0.9 % 100 mL IVPB (has no administration in time range)  metroNIDAZOLE (FLAGYL) IVPB 500 mg (has no administration in time range)  linezolid (ZYVOX) IVPB 600 mg (has no administration in time range)  sodium chloride 0.9 % bolus 1,000 mL (1,000  mLs Intravenous New Bag/Given 09/15/22 2141)  iohexol (OMNIPAQUE) 300 MG/ML solution 100 mL (100 mLs Intravenous Contrast Given 09/15/22 2149)     IMPRESSION / MDM / ASSESSMENT AND PLAN / ED COURSE  I reviewed the triage vital signs and the nursing notes.  51 year old female currently bedbound with morbid obesity who has not seen a doctor in many years presents due to generalized weakness, shortness of breath, and a lower abdominal mass that she is concerned is an abscess.  EMS found her glucose to be 578.  On exam the patient is alert and oriented.  She is borderline tachycardic with otherwise normal vital signs although she is chronically ill and weak appearing.  Physical exam is otherwise as described above.  In terms of the lower abdominal/groin area mass, differential diagnosis includes, but is not limited to, abscess, cellulitis, lymphedema.  The patient is significantly hyperglycemic so differential also includes acute diabetic crisis including HHS, DKA, or other metabolic disturbance.  We will work-up the patient broadly with labs, chest x-ray, CT abdomen/pelvis to evaluate for the mass, and reassess.  I anticipate the patient will need admission.  Patient's presentation is most consistent with acute presentation with potential threat to life or bodily function.  The patient is on the cardiac monitor to evaluate  for evidence of arrhythmia and/or significant heart rate changes.  ----------------------------------------- 11:20 PM on 09/15/2022 -----------------------------------------  Lab work-up revealed a glucose of 815 with a slightly elevated anion gap but normal pH.  This is most consistent with HHS.  I started the patient on IV fluids and an insulin infusion.  Lactate and WBC count are also elevated concerning for sepsis so I ordered broad-spectrum empiric antibiotics.  The patient has remained hemodynamically stable.  CT was obtained and I got a verbal report from radiology that it was concerning for necrotizing fasciitis in the lower abdominal pannus and perineum.  I consulted Dr. Maia Plan from general surgery who recommended admission, and while awaiting his evaluation I consulted NP Ouma from the ICU and discussed the case with her as well.    Dr. Maia Plan evaluated the patient and subsequently recommended transfer to a tertiary care center given the patient's morbid obesity, the extent of the area involved in the necrotizing fasciitis, and the need for likely plastic and reconstructive surgery, complex wound care, and multidisciplinary coordination of her multiple acute issues.  He contacted the transfer center at [facility] and discussed with surgery there.  Dr. Maia Plan advised that [facility] recommendation was that the initial debridement surgery be done here and that the patient could subsequently be transferred.  Therefore we proceeded with admission to the ICU.  I counseled the patient on the results of the work-up and the plan of care.    FINAL CLINICAL IMPRESSION(S) / ED DIAGNOSES   Final diagnoses:  Hyperglycemic crisis due to diabetes mellitus (HCC)  Sepsis, due to unspecified organism, unspecified whether acute organ dysfunction present (HCC)  Necrotizing fasciitis (HCC)     Rx / DC Orders   ED Discharge Orders     None        Note:  This document was prepared using Dragon voice  recognition software and may include unintentional dictation errors.    Dionne Bucy, MD 09/16/22 [phone]

## 2022-09-15 NOTE — ED Triage Notes (Signed)
[facility] reports pt coming from home c/o abscess in her groin area. CBG 548, weakness for the past few days.

## 2022-09-15 NOTE — Consult Note (Addendum)
SURGICAL CONSULTATION NOTE   HISTORY OF PRESENT ILLNESS (HPI):  51 y.o. female presented to [facility] ED for evaluation of abscess in her groin. Patient reports she has been feeling an abscess in the right groin since a week ago.  She understood that the area has been getting more swollen.  He has been causing her weakness, pain and unable to get out of bed.  Pain localized mainly on the right groin area.  No specific pain radiation.  Pain aggravated by applying pressure or any movement of the abdominal wall.  There has been no alleviating factors.  At the ED she was found with leukocytosis of 13,000.  Hemoglobin of 15.3.  CMP shows glucose of 115.  Blood gases shows pH of 7.38 with bicarbonate of 26.  There is lactic acid of 3.3.  Her vital signs shows mild tachycardia with adequate blood pressures.  Physical exam shows extensive erythema from the lower abdominal wall and perineal area.  CT scan of the abdomen and pelvis shows extensive soft tissue gas of the lower abdominal wall, perineum and proximal bilateral thigh.  I present evaluated the images.  Patient has not gone to a doctor in more than 5 years.  She denies any past medical history.  She denies being diagnosed with diabetes in the past.  She is morbidly obese.  She does not know her weight.  Surgery is consulted by Dr. Marisa Severin in this context for evaluation and management of necrotizing fasciitis.  PAST MEDICAL HISTORY (PMH):  Past medical history reviewed.  Patient without any diagnosed past medical history. Clinically here she is definitely diabetic  PAST SURGICAL HISTORY (PSH):  Patient denies any surgical history.  MEDICATIONS:  Prior to Admission medications   Not on File  Patient does not take any medication at home  ALLERGIES:  Allergies  Allergen Reactions   Amoxicillin Rash     SOCIAL HISTORY:  Social History   Socioeconomic History   Marital status: Single    Spouse name: Not on file   Number of children: Not on  file   Years of education: Not on file   Highest education level: Not on file  Occupational History   Not on file  Tobacco Use   Smoking status: Not on file   Smokeless tobacco: Not on file  Substance and Sexual Activity   Alcohol use: Not on file   Drug use: Not on file   Sexual activity: Not on file  Other Topics Concern   Not on file  Social History Narrative   Not on file   Social Determinants of Health   Financial Resource Strain: Not on file  Food Insecurity: Not on file  Transportation Needs: Not on file  Physical Activity: Not on file  Stress: Not on file  Social Connections: Not on file  Intimate Partner Violence: Not on file      FAMILY HISTORY:  No family history on file.   REVIEW OF SYSTEMS:  Constitutional: Positive fever, chills, sweats and weakness Eyes: denies any other vision changes, history of eye injury  ENT: denies sore throat, hearing problems  Respiratory: Positive shortness of breath, wheezing  Cardiovascular: denies chest pain, palpitations  Gastrointestinal: Positive abdominal pain, nausea and vomiting Genitourinary: denies burning with urination or urinary frequency Musculoskeletal: denies any other joint pains or cramps  Skin: denies any other rashes or skin discolorations  Neurological: denies any other headache, dizziness Psychiatric: denies any other depression, anxiety   All other review of systems were  negative   VITAL SIGNS:  Temp:  [98.1 F (36.7 C)] 98.1 F (36.7 C) (11/05 1857) Pulse Rate:  [102] 102 (11/05 1857) Resp:  [18] 18 (11/05 1857) BP: (133)/(114) 133/114 (11/05 1857) SpO2:  [95 %] 95 % (11/05 1857)             INTAKE/OUTPUT:  This shift: No intake/output data recorded.  Last 2 shifts: @IOLAST2SHIFTS @   PHYSICAL EXAM:  Constitutional:  -- Morbidly obese -- Awake, alert, and oriented x3  Eyes:  -- Pupils equally round and reactive to light  -- No scleral icterus  Ear, nose, and throat:  -- No jugular  venous distension  Pulmonary:  -- No crackles  -- Equal breath sounds bilaterally -- Breathing non-labored at rest Cardiovascular:  -- S1, S2 present  -- No pericardial rubs Gastrointestinal:  -- Abdominal cavity soft and depressible.  There is extensive erythema in the lower pannus area.  There is crepitance on palpation.  Very tender to palpation.  This extends out to the perineum. Musculoskeletal and Integumentary:  -- Extensive erythema of the skin of the pannus and perineal area. -- Extremities: Edema of the lower extremities   Neurologic:  -- Motor function: intact and symmetric -- Sensation: intact and symmetric   Labs:     Latest Ref Rng & Units 09/15/2022    7:48 PM  CBC  WBC 4.0 - 10.5 K/uL 12.9   Hemoglobin 12.0 - 15.0 g/dL 15.3   Hematocrit 36.0 - 46.0 % 47.8   Platelets 150 - 400 K/uL 420       Latest Ref Rng & Units 09/15/2022    7:48 PM  CMP  Glucose 70 - 99 mg/dL 815   BUN 6 - 20 mg/dL 28   Creatinine 0.44 - 1.00 mg/dL 0.88   Sodium 135 - 145 mmol/L 141   Potassium 3.5 - 5.1 mmol/L 4.7   Chloride 98 - 111 mmol/L 99   CO2 22 - 32 mmol/L 22   Calcium 8.9 - 10.3 mg/dL 9.9   Total Protein 6.5 - 8.1 g/dL 9.2   Total Bilirubin 0.3 - 1.2 mg/dL 1.7   Alkaline Phos 38 - 126 U/L 144   AST 15 - 41 U/L 30   ALT 0 - 44 U/L 26     Imaging studies:  EXAM: CT ABDOMEN AND PELVIS WITH CONTRAST   TECHNIQUE: Multidetector CT imaging of the abdomen and pelvis was performed using the standard protocol following bolus administration of intravenous contrast.   RADIATION DOSE REDUCTION: This exam was performed according to the departmental dose-optimization program which includes automated exposure control, adjustment of the mA and/or kV according to patient size and/or use of iterative reconstruction technique.   CONTRAST:  126mL OMNIPAQUE IOHEXOL 300 MG/ML  SOLN   COMPARISON:  None Available.   FINDINGS: Lower chest: No basilar airspace disease or pleural  effusion. Heart size upper normal.   Hepatobiliary: The liver is enlarged spanning 19.2 cm with diffuse steatosis. No focal liver lesions. Gallbladder is decompressed. No calcified gallstone. No biliary dilatation.   Pancreas: No ductal dilatation or inflammation.   Spleen: Enlarged spanning 15.1 x 4.7 x 11.4 cm (volume = 420 cm^3). No focal abnormalities.   Adrenals/Urinary Tract: Adrenal thickening without nodule. No hydronephrosis or perinephric edema. Homogeneous renal enhancement. Absent renal excretion on delayed phase imaging. Punctate nonobstructing stone in the mid left kidney. Foley catheter partially decompresses the urinary bladder. No bladder wall thickening.   Stomach/Bowel: Complex umbilical/lower abdominal ventral abdominal  wall hernia contains moderate length segment of small bowel. There is no associated obstruction or inflammation. Normal appendix is visualized. Moderate volume of colonic stool. The stomach is decompressed.   Vascular/Lymphatic: Normal caliber abdominal aorta with atherosclerosis. Patent portal and splenic veins. No mesenteric or portal venous gas.   Reproductive: Uterus and bilateral adnexa are unremarkable.   Other: There is extensive soft tissue gas and subcutaneous emphysema involving the perineum, more so on the right. Soft tissue gas tracks into the gluteal tissues posteriorly, abdominal pannus anteriorly and the left abdominal wall. Associated skin thickening and subcutaneous edema. Small associated fluid collection in the right lower aspect of the pannus measures 5.5 cm. Abdominal/lower ventral abdominal wall hernia contains small bowel.   Musculoskeletal: Mild L5-S1 degenerative disc disease. There are no acute or suspicious osseous abnormalities.   IMPRESSION: 1. Findings consistent with necrotizing fasciitis involving the perineum and lower abdominal pannus. Extensive soft tissue gas with subcutaneous edema and small right  lower abdominal pannus fluid collection. Urgent surgical consultation is needed. 2. Moderate-sized umbilical/lower ventral abdominal wall hernia containing small bowel, no associated obstruction. 3. Hepatosplenomegaly and hepatic steatosis. 4. Absent renal excretion on delayed phase imaging suggesting underlying renal dysfunction. 5. Nonobstructing left renal stone.   Aortic Atherosclerosis (ICD10-I70.0).   Critical Value/emergent results were called by telephone at the time of interpretation on 09/15/2022 at approximately 10:10 pm to provider [facility] , who verbally acknowledged these results.     Electronically Signed   By: Narda Rutherford M.D.   On: 09/15/2022 22:26  Assessment/Plan:  51 y.o. female with creatinine fasciitis of the lower abdominal wall and perennial area, complicated by pertinent comorbidities including morbid obesity, diabetic.  Patient with extensive necrotizing fasciitis of the lower abdominal wall, perineum and proximal lower extremities.  I discussed with patient findings of CT scan.  I discussed with patient the severity of the infection and extension.  I discussed with patient recommendation of debridement with extensive excision of pannus and skin of the perineum and lower extremity.  I discussed with patient that she will ended up with a significant amount of soft tissue excision and open wound.  I also discussed with patient that she will need multiple surgical intervention and very complex wound care.  I discussed with patient that she will need to be transfer to a tertiary center for future treatments due to the complexity of her wound care.  She will benefit of multidisciplinary approach including general surgery, plastic surgery, complex wound care service, hyperbaric therapy, nutrition service, among others.  I discussed case with [facility] general surgery Dr. Ilsa Iha who recommended to proceed with emergent surgical intervention at this institution and  he will assess the patient once the initial surgical intervention is completed.  Case also discussed with the ICU team who accepted the patient for admission to the ICU.  Patient currently started on aggressive IV antibiotic therapy, insulin drip, IV hydration.  Due to the severity of the necrotizing fasciitis I think that patient will need to proceed with emergent debridement.  Gae Gallop, MD

## 2022-09-16 ENCOUNTER — Other Ambulatory Visit: Payer: Self-pay

## 2022-09-16 ENCOUNTER — Encounter: Payer: Self-pay | Admitting: Pulmonary Disease

## 2022-09-16 ENCOUNTER — Inpatient Hospital Stay: Payer: Self-pay

## 2022-09-16 ENCOUNTER — Inpatient Hospital Stay: Payer: Self-pay | Admitting: Certified Registered"

## 2022-09-16 DIAGNOSIS — A419 Sepsis, unspecified organism: Principal | ICD-10-CM

## 2022-09-16 DIAGNOSIS — E872 Acidosis, unspecified: Secondary | ICD-10-CM

## 2022-09-16 DIAGNOSIS — R6520 Severe sepsis without septic shock: Secondary | ICD-10-CM

## 2022-09-16 LAB — CBC
HCT: 40.4 % (ref 36.0–46.0)
Hemoglobin: 13.2 g/dL (ref 12.0–15.0)
MCH: 29.1 pg (ref 26.0–34.0)
MCHC: 32.7 g/dL (ref 30.0–36.0)
MCV: 89 fL (ref 80.0–100.0)
Platelets: 420 K/uL — ABNORMAL HIGH (ref 150–400)
RBC: 4.54 MIL/uL (ref 3.87–5.11)
RDW: 12.5 % (ref 11.5–15.5)
WBC: 17.1 K/uL — ABNORMAL HIGH (ref 4.0–10.5)
nRBC: 0 % (ref 0.0–0.2)

## 2022-09-16 LAB — BASIC METABOLIC PANEL WITH GFR
Anion gap: 11 (ref 5–15)
BUN: 29 mg/dL — ABNORMAL HIGH (ref 6–20)
CO2: 27 mmol/L (ref 22–32)
Calcium: 9.1 mg/dL (ref 8.9–10.3)
Chloride: 110 mmol/L (ref 98–111)
Creatinine, Ser: 0.87 mg/dL (ref 0.44–1.00)
GFR, Estimated: >60 mL/min
Glucose, Bld: 492 mg/dL — ABNORMAL HIGH (ref 70–99)
Potassium: 4.2 mmol/L (ref 3.5–5.1)
Sodium: 148 mmol/L — ABNORMAL HIGH (ref 135–145)

## 2022-09-16 LAB — PROTIME-INR
INR: 1.3 — ABNORMAL HIGH (ref 0.8–1.2)
Prothrombin Time: 16.4 s — ABNORMAL HIGH (ref 11.4–15.2)

## 2022-09-16 LAB — BLOOD GAS, ARTERIAL
Acid-Base Excess: 3.4 mmol/L — ABNORMAL HIGH (ref 0.0–2.0)
Acid-base deficit: 0.8 mmol/L (ref 0.0–2.0)
Bicarbonate: 25.8 mmol/L (ref 20.0–28.0)
Bicarbonate: 29.6 mmol/L — ABNORMAL HIGH (ref 20.0–28.0)
FIO2: 100 %
FIO2: 60 %
MECHVT: 550 mL
MECHVT: 550 mL
Mechanical Rate: 16
Mechanical Rate: 17
O2 Saturation: 100 %
O2 Saturation: 100 %
PEEP: 6 cmH2O
PEEP: 5 cmH2O
Patient temperature: 37
Patient temperature: 37
pCO2 arterial: 49 mmHg — ABNORMAL HIGH (ref 32–48)
pCO2 arterial: 50 mmHg — ABNORMAL HIGH (ref 32–48)
pH, Arterial: 7.33 — ABNORMAL LOW (ref 7.35–7.45)
pH, Arterial: 7.38 (ref 7.35–7.45)
pO2, Arterial: 264 mmHg — ABNORMAL HIGH (ref 83–108)
pO2, Arterial: 189 mmHg — ABNORMAL HIGH (ref 83–108)

## 2022-09-16 LAB — TYPE AND SCREEN
ABO/RH(D): A POS
Antibody Screen: NEGATIVE

## 2022-09-16 LAB — CBG MONITORING, ED
Glucose-Capillary: 225 mg/dL — ABNORMAL HIGH (ref 70–99)
Glucose-Capillary: 392 mg/dL — ABNORMAL HIGH (ref 70–99)
Glucose-Capillary: 549 mg/dL (ref 70–99)

## 2022-09-16 LAB — GLUCOSE, CAPILLARY
Glucose-Capillary: 446 mg/dL — ABNORMAL HIGH (ref 70–99)
Glucose-Capillary: 432 mg/dL — ABNORMAL HIGH (ref 70–99)
Glucose-Capillary: 437 mg/dL — ABNORMAL HIGH (ref 70–99)
Glucose-Capillary: 425 mg/dL — ABNORMAL HIGH (ref 70–99)
Glucose-Capillary: 451 mg/dL — ABNORMAL HIGH (ref 70–99)
Glucose-Capillary: 396 mg/dL — ABNORMAL HIGH (ref 70–99)
Glucose-Capillary: 337 mg/dL — ABNORMAL HIGH (ref 70–99)
Glucose-Capillary: 274 mg/dL — ABNORMAL HIGH (ref 70–99)
Glucose-Capillary: 219 mg/dL — ABNORMAL HIGH (ref 70–99)
Glucose-Capillary: 185 mg/dL — ABNORMAL HIGH (ref 70–99)
Glucose-Capillary: 179 mg/dL — ABNORMAL HIGH (ref 70–99)

## 2022-09-16 LAB — LACTIC ACID, PLASMA: Lactic Acid, Venous: 2.5 mmol/L (ref 0.5–1.9)

## 2022-09-16 LAB — APTT: aPTT: 24 seconds (ref 24–36)

## 2022-09-16 LAB — PROCALCITONIN
Procalcitonin: 0.5 ng/mL
Procalcitonin: 0.9 ng/mL

## 2022-09-16 LAB — OSMOLALITY: Osmolality: 354 mOsm/kg (ref 275–295)

## 2022-09-16 LAB — MRSA NEXT GEN BY PCR, NASAL: MRSA by PCR Next Gen: NOT DETECTED

## 2022-09-16 LAB — BETA-HYDROXYBUTYRIC ACID: Beta-Hydroxybutyric Acid: 6.17 mmol/L — ABNORMAL HIGH (ref 0.05–0.27)

## 2022-09-16 LAB — TROPONIN I (HIGH SENSITIVITY): Troponin I (High Sensitivity): 10 ng/L (ref <18)

## 2022-09-16 LAB — MAGNESIUM: Magnesium: 2.3 mg/dL (ref 1.7–2.4)

## 2022-09-16 LAB — HIV ANTIBODY (ROUTINE TESTING W REFLEX): HIV Screen 4th Generation wRfx: NONREACTIVE

## 2022-09-16 LAB — PHOSPHORUS: Phosphorus: 5.2 mg/dL — ABNORMAL HIGH (ref 2.5–4.6)

## 2022-09-16 MED ORDER — LACTATED RINGERS IV BOLUS
1000.0000 mL | Freq: Once | INTRAVENOUS | Status: AC
  Administered 2022-09-16: 1000 mL via INTRAVENOUS

## 2022-09-16 MED ORDER — NOREPINEPHRINE 4 MG/250ML-% IV SOLN
2.0000 ug/min | INTRAVENOUS | Status: DC
  Administered 2022-09-16: 2 ug/min via INTRAVENOUS
  Filled 2022-09-16: qty 250

## 2022-09-16 MED ORDER — DAKINS (1/4 STRENGTH) 0.125 % EX SOLN
CUTANEOUS | Status: DC | PRN
  Administered 2022-09-16: 1

## 2022-09-16 MED ORDER — LACTATED RINGERS IV SOLN: 125.0000 mL | INTRAVENOUS | 0 refills | Status: DC

## 2022-09-16 MED ORDER — DOCUSATE SODIUM 50 MG/5ML PO LIQD: 100.0000 mg | Freq: Two times a day (BID) | ORAL | 0 refills | Status: DC

## 2022-09-16 MED ORDER — SODIUM CHLORIDE 0.9 % IV SOLN: 250.0000 mL | INTRAVENOUS | Status: DC

## 2022-09-16 MED ORDER — POLYETHYLENE GLYCOL 3350 17 G PO PACK: 17.0000 g | PACK | Freq: Every day | ORAL | Status: DC

## 2022-09-16 MED ORDER — SUCCINYLCHOLINE CHLORIDE 200 MG/10ML IV SOSY
PREFILLED_SYRINGE | INTRAVENOUS | Status: DC | PRN
  Administered 2022-09-16: 140 mg via INTRAVENOUS

## 2022-09-16 MED ORDER — FENTANYL 2500MCG IN NS 250ML (10MCG/ML) PREMIX INFUSION: 50.0000 ug/h | INTRAVENOUS | 0 refills | Status: DC

## 2022-09-16 MED ORDER — PHENYLEPHRINE 80 MCG/ML (10ML) SYRINGE FOR IV PUSH (FOR BLOOD PRESSURE SUPPORT)
PREFILLED_SYRINGE | INTRAVENOUS | Status: DC | PRN
  Administered 2022-09-16 (×5): 160 ug via INTRAVENOUS

## 2022-09-16 MED ORDER — POLYETHYLENE GLYCOL 3350 17 G PO PACK: 17.0000 g | PACK | Freq: Every day | ORAL | 0 refills | Status: DC

## 2022-09-16 MED ORDER — ORAL CARE MOUTH RINSE: 15.0000 mL | OROMUCOSAL | Status: DC | PRN

## 2022-09-16 MED ORDER — LACTATED RINGERS IV BOLUS: 1000.0000 mL | Freq: Once | INTRAVENOUS | 0 refills | Status: AC

## 2022-09-16 MED ORDER — ORAL CARE MOUTH RINSE: 15.0000 mL | OROMUCOSAL | 0 refills | Status: DC | PRN

## 2022-09-16 MED ORDER — ORAL CARE MOUTH RINSE
15.0000 mL | OROMUCOSAL | Status: DC
  Administered 2022-09-16 (×3): 15 mL via OROMUCOSAL

## 2022-09-16 MED ORDER — ROCURONIUM BROMIDE 100 MG/10ML IV SOLN
INTRAVENOUS | Status: DC | PRN
  Administered 2022-09-16 (×3): 50 mg via INTRAVENOUS
  Administered 2022-09-16: 30 mg via INTRAVENOUS

## 2022-09-16 MED ORDER — PROPOFOL 1000 MG/100ML IV EMUL
5.0000 ug/kg/min | INTRAVENOUS | Status: DC
  Administered 2022-09-16: 5 ug/kg/min via INTRAVENOUS
  Administered 2022-09-16: 18 ug/kg/min via INTRAVENOUS
  Filled 2022-09-16 (×3): qty 100

## 2022-09-16 MED ORDER — PROPOFOL 1000 MG/100ML IV EMUL: 5.0000 ug/kg/min | INTRAVENOUS | Status: DC

## 2022-09-16 MED ORDER — KETAMINE HCL 10 MG/ML IJ SOLN
INTRAMUSCULAR | Status: DC | PRN
  Administered 2022-09-16: 30 mg via INTRAVENOUS
  Administered 2022-09-16: 20 mg via INTRAVENOUS

## 2022-09-16 MED ORDER — FENTANYL 2500MCG IN NS 250ML (10MCG/ML) PREMIX INFUSION
50.0000 ug/h | INTRAVENOUS | Status: DC
  Administered 2022-09-16: 50 ug/h via INTRAVENOUS
  Filled 2022-09-16: qty 250

## 2022-09-16 MED ORDER — MIDAZOLAM HCL 2 MG/2ML IJ SOLN
INTRAMUSCULAR | Status: AC
  Administered 2022-09-16: 2 mg via INTRAVENOUS
  Filled 2022-09-16: qty 2

## 2022-09-16 MED ORDER — PIPERACILLIN-TAZOBACTAM 3.375 G IVPB
3.3750 g | Freq: Three times a day (TID) | INTRAVENOUS | Status: DC
  Administered 2022-09-16: 3.375 g via INTRAVENOUS
  Filled 2022-09-16: qty 50

## 2022-09-16 MED ORDER — SODIUM CHLORIDE 0.9 % IV SOLN: INTRAVENOUS | Status: DC | PRN

## 2022-09-16 MED ORDER — NOREPINEPHRINE 4 MG/250ML-% IV SOLN
INTRAVENOUS | Status: AC
  Filled 2022-09-16: qty 250

## 2022-09-16 MED ORDER — DOCUSATE SODIUM 50 MG/5ML PO LIQD: 100.0000 mg | Freq: Two times a day (BID) | ORAL | Status: DC

## 2022-09-16 MED ORDER — SODIUM CHLORIDE 0.9 % IV SOLN: 250.0000 mL | INTRAVENOUS | 0 refills | Status: DC

## 2022-09-16 MED ORDER — CHLORHEXIDINE GLUCONATE CLOTH 2 % EX PADS: 6.0000 | MEDICATED_PAD | Freq: Every day | CUTANEOUS | Status: DC

## 2022-09-16 MED ORDER — PROPOFOL 10 MG/ML IV BOLUS
INTRAVENOUS | Status: AC
  Filled 2022-09-16: qty 20

## 2022-09-16 MED ORDER — SODIUM CHLORIDE 0.9 % IV SOLN: 10.0000 mL | INTRAVENOUS | 0 refills | Status: DC | PRN

## 2022-09-16 MED ORDER — MIDAZOLAM HCL 2 MG/2ML IJ SOLN
INTRAMUSCULAR | Status: AC
  Filled 2022-09-16: qty 2

## 2022-09-16 MED ORDER — DOCUSATE SODIUM 100 MG PO CAPS: 100.0000 mg | ORAL_CAPSULE | Freq: Two times a day (BID) | ORAL | 0 refills | Status: DC | PRN

## 2022-09-16 MED ORDER — KETAMINE HCL 50 MG/5ML IJ SOSY
PREFILLED_SYRINGE | INTRAMUSCULAR | Status: AC
  Filled 2022-09-16: qty 5

## 2022-09-16 MED ORDER — NOREPINEPHRINE 4 MG/250ML-% IV SOLN: 0.0000 ug/min | INTRAVENOUS | Status: DC

## 2022-09-16 MED ORDER — POLYETHYLENE GLYCOL 3350 17 G PO PACK: 17.0000 g | PACK | Freq: Every day | ORAL | 0 refills | Status: DC | PRN

## 2022-09-16 MED ORDER — DEXTROSE 50 % IV SOLN: 0.0000 mL | INTRAVENOUS | Status: DC | PRN

## 2022-09-16 MED ORDER — FENTANYL CITRATE (PF) 100 MCG/2ML IJ SOLN
INTRAMUSCULAR | Status: AC
  Filled 2022-09-16: qty 2

## 2022-09-16 MED ORDER — LACTATED RINGERS IV SOLN: INTRAVENOUS | Status: DC

## 2022-09-16 MED ORDER — MIDAZOLAM HCL 2 MG/2ML IJ SOLN
INTRAMUSCULAR | Status: DC | PRN
  Administered 2022-09-16 (×2): 2 mg via INTRAVENOUS

## 2022-09-16 MED ORDER — SUCCINYLCHOLINE CHLORIDE 200 MG/10ML IV SOSY
PREFILLED_SYRINGE | INTRAVENOUS | Status: AC
  Filled 2022-09-16: qty 10

## 2022-09-16 MED ORDER — LIDOCAINE HCL (PF) 2 % IJ SOLN
INTRAMUSCULAR | Status: AC
  Filled 2022-09-16: qty 5

## 2022-09-16 MED ORDER — ORAL CARE MOUTH RINSE: 15.0000 mL | OROMUCOSAL | 0 refills | Status: DC

## 2022-09-16 MED ORDER — INSULIN REGULAR(HUMAN) IN NACL 100-0.9 UT/100ML-% IV SOLN: 1.0000 [IU]/h | INTRAVENOUS | Status: DC

## 2022-09-16 MED ORDER — LINEZOLID 600 MG/300ML IV SOLN: 600.0000 mg | Freq: Two times a day (BID) | INTRAVENOUS | Status: DC

## 2022-09-16 MED ORDER — PROPOFOL 10 MG/ML IV BOLUS
INTRAVENOUS | Status: DC | PRN
  Administered 2022-09-16: 150 mg via INTRAVENOUS

## 2022-09-16 MED ORDER — PIPERACILLIN-TAZOBACTAM 3.375 G IVPB: 3.3750 g | Freq: Three times a day (TID) | INTRAVENOUS | Status: DC

## 2022-09-16 MED ORDER — FENTANYL CITRATE (PF) 100 MCG/2ML IJ SOLN
INTRAMUSCULAR | Status: DC | PRN
  Administered 2022-09-16 (×4): 50 ug via INTRAVENOUS

## 2022-09-16 MED ORDER — MIDAZOLAM HCL 2 MG/2ML IJ SOLN: 2.0000 mg | Freq: Once | INTRAMUSCULAR | Status: AC

## 2022-09-16 MED ORDER — DEXTROSE IN LACTATED RINGERS 5 % IV SOLN: 125.0000 mL/h | INTRAVENOUS | Status: DC

## 2022-09-16 NOTE — Progress Notes (Signed)
An USGPIV (ultrasound guided PIV) has been placed 22g L forearm for short-term vasopressor infusion. A correctly placed ivWatch must be used when administering Vasopressors. Should this treatment be needed beyond 72 hours, central line access should be obtained.  It will be the responsibility of the bedside nurse to follow best practice to prevent extravasations.

## 2022-09-16 NOTE — Progress Notes (Signed)
Call received from [facility] with bed assignment: [facility] SICU, room 2712; Dr. Darrin Luis, receiving MD. Report given to [facility], RN in SICU. Awaiting call back from [facility] for handoff to transport team.

## 2022-09-16 NOTE — Progress Notes (Signed)
[facility] Progress Note Patient Name: Tracy Woods DOB: [birth year] MRN: [id number]   Date of Service  09/16/2022  HPI/Events of Note  51/F who presents with R groin abscess. She was noted to have leukocytosis of 13, lactate of 3.3. Imaging revealed necrotizing fascitiis involving the perineum and lower abdominal pannus. She was taken to the OR emergently for debridement. She remains intubated post op and was admitted to the ICU.  eICU Interventions  Necrotizing fascitis, perineum - Brought to the OR for emergent debridement. Will follow further surgery plans.  - Started on empiric antibiotic coverage. Will follow cultures and adjust antibiotics as warranted.  - Continue IVF.  - Pt not requiring vasopressor support at this time.  - Trend WBC, lactate, temperature curve.   2. Hyperglycemia - Pt hyperglycemic, with elevated BHB. Bicarb 22, but anion gap is 20 (corrects to 23) - Started on an insulin drip. Will transition to Soham insulin when gap has closed, glucoses controlled - Continue IVF. Will transition to D5 containing fluids once glucose drops below 250mg /dL - Serial BMP. Correct K as needed.   3. Mechanical ventilation - Pt remains intubated post op.  - Will continue on sedation, plan on daily wake up - Maintain on TV 4-31ml/kg PBW - Titrate FIO2, PEEP to maintain SPO2 >90% - Serial ABG, will make further vent adjsutments as needed        Maxey Ransom M DELA CRUZ 09/16/2022, 3:37 AM

## 2022-09-16 NOTE — Anesthesia Procedure Notes (Signed)
Arterial Line Insertion Start/End11/04/2022 1:10 AM, 09/16/2022 1:40 AM Performed by: Molli Barrows, MD, Esaw Grandchild, CRNA, anesthesiologist  Patient location: OR. Preanesthetic checklist: patient identified, IV checked, site marked, risks and benefits discussed, surgical consent, monitors and equipment checked, pre-op evaluation, timeout performed and anesthesia consent Patient sedated Right, radial was placed Catheter size: 20 G Hand hygiene performed  and Seldinger technique used Allen's test indicative of satisfactory collateral circulation Attempts: 5 or more Procedure performed using ultrasound guided technique. Ultrasound Notes:anatomy identified and no ultrasound evidence of intravascular and/or intraneural injection Post procedure assessment: normal  Post procedure complications: unsuccessful attempts and second provider assisted. Patient tolerated the procedure well with no immediate complications. Additional procedure comments: 2 attempts by CRNA Emmogene Simson on left radial w/ultrasound unable to thread catheter after getting flash, 2 attempts by Dr. Andree Elk on left radial unable to thread catheter, 1st attempt on right radial by Dr. Andree Elk successful K/08U, no complications.

## 2022-09-16 NOTE — Anesthesia Procedure Notes (Signed)
Procedure Name: Intubation Date/Time: 09/16/2022 12:58 AM  Performed by: Esaw Grandchild, CRNAPre-anesthesia Checklist: Patient identified, Emergency Drugs available, Suction available and Patient being monitored Patient Re-evaluated:Patient Re-evaluated prior to induction Oxygen Delivery Method: Circle system utilized Preoxygenation: Pre-oxygenation with 100% oxygen Induction Type: IV induction, Rapid sequence and Cricoid Pressure applied Ventilation: Mask ventilation without difficulty Laryngoscope Size: McGraph and 3 Grade View: Grade I Tube type: Oral Tube size: 7.0 mm Number of attempts: 1 Airway Equipment and Method: Stylet, Oral airway, Bite block and Patient positioned with wedge pillow Placement Confirmation: ETT inserted through vocal cords under direct vision, positive ETCO2 and breath sounds checked- equal and bilateral Secured at: 21 cm Tube secured with: Tape Dental Injury: Teeth and Oropharynx as per pre-operative assessment  Difficulty Due To: Difficulty was anticipated, Difficult Airway- due to reduced neck mobility, Difficult Airway- due to dentition and Difficult Airway- due to limited oral opening

## 2022-09-16 NOTE — ED Notes (Signed)
Unable to obtain a second IV line.  Hospital bed ordered for comfort and to allow placement of foley catheter.

## 2022-09-16 NOTE — Progress Notes (Signed)
Attempted multiple times to contact number provided on the chart to reach a family member for updates regarding transfer to [facility] but there is no answer or contact listed.    Rufina Falco, DNP, CCRN, FNP-C, AGACNP-BC Acute Care & Family Nurse Practitioner  [facility]  See Amion for personal pager PCCM on call pager [phone] until 7 am

## 2022-09-16 NOTE — ED Notes (Signed)
Delay in started antibiotics due to poor venous access.  ERMD aware of same.  IV team to bedside for placement.

## 2022-09-16 NOTE — Progress Notes (Signed)
Pharmacy Antibiotic Note  Tracy Woods is a 51 y.o. female admitted on 09/15/2022 with intra-abdominal infected.  Pharmacy has been consulted for Zosyn dosing.  Plan: Zosyn 3.375g IV q8h (4 hour infusion).  Pharmacy will continue to follow and will adjust abx dosing whenever warranted.  Temp (24hrs), Avg:97.8 F (36.6 C), Min:97.3 F (36.3 C), Max:98.4 F (36.9 C)   Recent Labs  Lab 09/15/22 1948 09/15/22 2014 09/16/22 0003 09/16/22 0435  WBC 12.9*  --   --  17.1*  CREATININE 0.88  --   --  0.87  LATICACIDVEN  --  3.3* 2.5*  --     CrCl cannot be calculated (Unknown ideal weight.).    Allergies  Allergen Reactions   Amoxicillin Rash    Antimicrobials this admission: 11/05 Cefepime >> x 1 dose 11/05 Flagyl  >> x 1 dose 11/05 Vancomycin >> x 1 dose 11/06 Linezolid >> 11/06 Zosyn >>  Microbiology results: 11/06 WoundCx: Pending  Thank you for allowing pharmacy to be a part of this patient's care.  Renda Rolls, PharmD, [facility] 09/16/2022 5:07 AM

## 2022-09-16 NOTE — Progress Notes (Addendum)
Patient ID: Tracy Woods, female   DOB: 07/13/1971, 51 y.o.   MRN: [id number]  SURGERY FOLLOW UP NOTE  Patient was reevaluated this morning.  She was critically ill, sedated on mechanical ventilation.  Under critical condition she seems to be hemodynamically stable.  Vitals:   09/16/22 0500 09/16/22 0521  BP: 100/69   Pulse: 99   Resp: (!) 21   Temp: 97.7 F (36.5 C)   SpO2: 99% 100%   She is on insulin drip, fentanyl drip, propofol drip.  She has not needed any pressors.  No issues with complex wound overnight.  Her postop labs shows hemoglobin of 13.2.  White blood cell count of 17.1.  Last glucose level is 492 (admission glucose was 115).  Patient has adequate urine output with normal BUN and creatinine level.  Due to the complexity on the wound and the need of multidisciplinary approach not available in this institution I think that patient would benefit of transfer to tertiary center.  I discussed case with Dr. Evelina Bucy, [facility] surgery and he gladly accepted the case to be transferred to the surgical intensive care unit.  I think the patient is stable to tolerate transfer at this moment.  There is no family contact number for updates.  Herbert Pun, MD, FACS  This follow-up encounter was for 40 minutes most the time evaluating the patient physically, evaluation of labs and coordinating transfer and plan of care.

## 2022-09-16 NOTE — ED Notes (Signed)
Indwelling foley catheter 14 f placed with assistance of 3 RN's.  Patient tolerated well.  She had clear urine return immediately.  Patient transferred to hospital stretcher.  She was able to stand and pivot with assistance.  Pericare completed prior to placement.

## 2022-09-16 NOTE — ED Notes (Signed)
Patient is alert but has some slurred speech.  She is oriented x 4.  Patient reports she lives at home and has not left he house nor had a shower for 3 years.  She does not have a pcp.  She lives with her son.  Patient called for assistance today due to falling at home and due to having an abscess in her perineal area.  Patient has noted scalling of the skin to bil lower legs.  She has large firm panus and her pubis area has a large firm area that is painful to touch.  Patient is embarrassed about the fact that she has not been able to bathe.  Patient denies known hx of diabetes.  Her CBG was reported to be high with EMS.  Mucous membranes are noted to be dry.

## 2022-09-16 NOTE — Op Note (Signed)
ATTENDING Surgeon(s): Herbert Pun, MD   ANESTHESIA: General   PRE-OPERATIVE DIAGNOSIS: Necrotizing fasciitis of the abdominal wall and perineum   POST-OPERATIVE DIAGNOSIS: Necrotizing fasciitis of the abdominal wall and perineum   PROCEDURE(S):  1.) Sharp excisional debridement of abdominal wall and perineum    INTRAOPERATIVE FINDINGS:  -Preoperative measurements.  There was extensive erythema through the pannus of the lower abdominal wall and perineum extending 20 cm x 20 cm -Postdebridement wound measures 40 cm x 20 cm -Wound bed amount of necrotic tissue down to distal rectus sheath cephalad, down to abductors magnus totally -The amount of tissue resected weight 2,173 grams  Predebridement    Post debridement    ESTIMATED BLOOD LOSS: 300 mL    SPECIMENS: Abdominal wall and perineum necrotizing fasciitis   COMPLICATIONS: None apparent   CONDITION AT END OF PROCEDURE: Critical ill, sedated on mechanical ventilation   INDICATIONS FOR PROCEDURE:  Patient is a 51 year old female with undiagnosed uncontrolled diabetes and BMI of 61 that comes with necrotizing fasciitis of the anterior abdominal wall and perineum. Patient with leukocytosis and extensive soft tissue gas identified on CT scan consistent with necrotizing fasciitis.      DETAILS OF PROCEDURE: After informed consent,patient was taken to the OR. Time out performed. General anesthesia induced. Patient placed on lithotomy position. The anterior abdominal wall, genitalia, perineum, proximal lower extremities where cleaned and draped in sterile fashion. With electrocautery a widely excision of skin, subcutaneous fat, fascia down into muscle, necrotic tissue from the inferior abdominal wall, right groin and perineum area was resected.  No significant tract to the right gluteal area.  The only area that I was unable to completely debride was on the anterior vaginal wall to avoid injury.  The whole wound was irrigated  with Pulsavac (3 Liters).  Hemostasis achieved.  A wet-to-dry dressing with Dakin solution was left packed in the wound.

## 2022-09-16 NOTE — ED Notes (Signed)
Patient continues to be alert.  She has been given ice chips for comfort.

## 2022-09-16 NOTE — Transfer of Care (Signed)
Immediate Anesthesia Transfer of Care Note  Patient: Tracy Woods  Procedure(s) Performed: IRRIGATION AND DEBRIDEMENT WOUND (Bilateral)  Patient Location: ICU  Anesthesia Type:General  Level of Consciousness: sedated and Patient remains intubated per anesthesia plan  Airway & Oxygen Therapy: Patient remains intubated per anesthesia plan and Patient placed on Ventilator (see vital sign flow sheet for setting)  Post-op Assessment: Report given to RN and Post -op Vital signs reviewed and stable  Post vital signs: Reviewed and stable  Last Vitals:  Vitals Value Taken Time  BP 132/66 09/16/22 0331  Temp 36.8 C 09/16/22 0331  Pulse 107 09/16/22 0332  Resp 18 09/16/22 0332  SpO2 100 % 09/16/22 0332  Vitals shown include unvalidated device data.  Last Pain:  Vitals:   09/16/22 0331  TempSrc: Temporal  PainSc: 0-No pain      Patients Stated Pain Goal: 0 (00/34/91 7915)  Complications:  Encounter Notable Events  Notable Event Outcome Phase Comment  Difficult to intubate - expected  Intraprocedure Filed from anesthesia note documentation.

## 2022-09-16 NOTE — Discharge Summary (Signed)
Physician Discharge Summary  Patient ID: Tracy Woods MRN: [id number] DOB/AGE: [birth year] 50 y.o.  Admit date: 09/15/2022 Discharge date: 09/16/2022   Brief Pt Description / Synopsis:  51 y.o. female admitted with severe sepsis in setting of Necrotizing fasciitis of the abdominal wall and perineum s/p excisional debridement.   Due to the complexity on the wound and the need of multidisciplinary approach not available in this institution, requires transfer to tertiary center.   Discharge Diagnoses:   Necrotizing fasciitis of the abdominal wall and perineum   Severe Sepsis Post operative Respiratory Failure Diabetic Ketoacidosis                                                            Discharge Summary:  51  y.o morbidly obese female who has not been to the doctor for over 5 years with no significant PMH on record , presenting to the ED with chief complaints of abscess in the groin area and generalized weakness x 1 week.   ED Course: Initial vital signs showed HR of 102 beats/minute, BP 133/114 mm Hg, the RR 18 breaths/minute, and the oxygen saturation 95 % on RA and a temperature of 98.25F (36.7C). .    Pertinent Labs/Diagnostics Findings: Chemistry:Na+/ K+:  141/4.7 Glucose: 815  BUN/Cr.:28/0.88 CBC: WBC: 12.9 Other Lab findings:   PCT: Lactic acid: 3.3 Troponin:15 Venous Blood Gas result:  pO2 46; pCO2 44; pH 7.38;  HCO3 26.0, %O2 Sat 68.2.  Imaging: CTA Chest> CT Abd/pelvis>Findings consistent with necrotizing fasciitis involving the perineum and lower abdominal pannus. Extensive soft tissue gas with subcutaneous edema and small right lower abdominal pannus fluid collection..   Patient given 30 cc/kg of fluids and started on broad-spectrum antibiotics Vanco cefepime and Flagyl for sepsis secondary to intrabdominal infection as above. She was also started on Insulin gtt per hyperglycemic protocol. Given findings as above consistent with necrotizing fasciitis involving the  perineum and lower abdomen, general surgery was consulted STAT. After further discussion it was determined that patient would be better served at a tertiary center.  General surgery reached out to [facility] accepting physician who recommended patient be debrided fast prior to consideration for transfer.  Patient was taken to the OR urgently for debridement and transferred to the ICU postop.  (Sepsis reassessment completed). PCCM consulted for further management.   Discharge Plan by Diagnosis:   Severe Sepsis Secondary to Necrotizing fasciitis of the abdominal wall and perineum  Initial interventions/workup included: 3 L of NS/LR & Cefepime/ Vancomycin/ Metronidazole meets SIRS criteria:  -Supplemental oxygen as needed, to maintain SpO2 > 90% -F/u cultures, trend lactic/ PCT -Monitor WBC/ fever curve -IV antibiotics:will add Zosyn to Linezolid  -IVF hydration as needed -Pressors for MAP goal >65 -Strict I/O's   Mechanical Ventilatory Support requirement Post Operative -Wean PEEP and FiO2 for sats greater than 90% -Plateau pressures less than 30 cm H20 -Continue lung protective strategies  -SBT once parameters mer -VAP bundle in place -Intermittent chest x-ray & ABG -wean sedation/analgesia for RASS goal 0   Necrotizing fasciitis of the abdominal wall and perineum  S/p excisional debridement of abdominal wall and perineum POD # 0 -Wound care per General surgery -General Surgery following, Per Dr. Peyton Najjar, case was discussed with [facility] at [facility] pending transfer post debridement and stabilization for continued  wound care management and or revision.   Diabetic Ketoacidosis  in the setting of severe sepsis as above Likely undiagnosed uncontrolled DM -Will check Lipase for pancreatitis -Received Insulin (regular) 0.1u/kg (~10 units) IV x1. Continue Insulin drip, DKA protocol -Keep NPO -Glucose: q1h to titrate insulin -Lab monitoring: 4h BMP+Phosphorus+pH -Aggressive  volume  repletion  -Goal to normalize anion gap  -Diabetes coordinator consult    Significant Events:  11/5; Admit with sepsis secondary to Necrotizing fasciitis of the abdominal wall and perineum s/p excisional debridement of abdominal wall and perineum 11/6: Transfered to ICU post op. PCCM consulted                  Significant Diagnostic Studies:  11/5: Chest Xray> IMPRESSION: 1. Low lung volumes with bronchovascular crowding versus vascular congestion. 2. Prominent heart size likely accentuated by technique.   11/5: CTA abdomen and pelvis> IMPRESSION: 1. Findings consistent with necrotizing fasciitis involving the perineum and lower abdominal pannus. Extensive soft tissue gas with subcutaneous edema and small right lower abdominal pannus fluid collection. Urgent surgical consultation is needed. 2. Moderate-sized umbilical/lower ventral abdominal wall hernia containing small bowel, no associated obstruction. 3. Hepatosplenomegaly and hepatic steatosis. 4. Absent renal excretion on delayed phase imaging suggesting underlying renal dysfunction. 5. Nonobstructing left renal stone.            Micro Data:  11/6: Blood culture x2> 11/6: MRSA PCR>>    Antimicrobials:  Zosyn 11/6> Linezolid 11/5>   Consults:  PCCM  General Surgery   Discharge Exam:  GENERAL: 51 year-old critically ill patient lying in the bed intubated and sedated EYES: Pupils equal, round, reactive to light and accommodation. No scleral icterus. Extraocular muscles intact.  HEENT: Head atraumatic, normocephalic. Oropharynx and nasopharynx clear.  NECK:  Supple, no jugular venous distention. No thyroid enlargement, no tenderness.  LUNGS: Normal breath sounds bilaterally, no wheezing, rales,rhonchi or crepitation. No use of accessory muscles of respiration.  CARDIOVASCULAR: S1, S2 normal. No murmurs, rubs, or gallops.  ABDOMEN: Soft, nontender, nondistended. Bowel sounds present. No organomegaly or mass.   EXTREMITIES: Upper and lower extremities are atraumatic in appearance without tenderness or deformity.  Bilateral lower extremity dependent edema. Capillary refill is less than 3 seconds in all extremities. Pulses palpable.  NEUROLOGIC: The patient is intubated and sedated.  Unable to assess motor function or sensation at this time. Cranial nerves are intact. Gait deferred PSYCHIATRIC: The patient is intubated and sedated  Vitals:   09/16/22 0615 09/16/22 0630 09/16/22 0645 09/16/22 0700  BP: 102/74 94/72 100/72 101/70  Pulse: 87 86 87 87  Resp: _0 Temp: 97.9 F (36.6 C) 97.9 F (36.6 C) 98.1 F (36.7 C) 98.1 F (36.7 C)  TempSrc:      SpO2: 99% 99% 99% 99%  Weight:         Discharge Labs:   BMET Recent Labs  Lab 09/15/22 1948 09/16/22 0435  NA 141 148*  K 4.7 4.2  CL 99 110  CO2 22 27  GLUCOSE 815* 492*  BUN 28* 29*  CREATININE 0.88 0.87  CALCIUM 9.9 9.1  MG  --  2.3  PHOS  --  5.2*    CBC Recent Labs  Lab 09/15/22 1948 09/16/22 0435  HGB 15.3* 13.2  HCT 47.8* 40.4  WBC 12.9* 17.1*  PLT 420* 420*    Anti-Coagulation Recent Labs  Lab 09/16/22 0010  INR 1.3*          Allergies as of  09/16/2022       Reactions   Amoxicillin Rash        Medication List     TAKE these medications    Chlorhexidine Gluconate Cloth 2 % Pads Apply 6 each topically daily.   dextrose 5% lactated ringers 5 % infusion Inject 125 mL/hr into the vein continuous.   dextrose 50 % solution Inject 0-50 mLs into the vein as needed for low blood sugar.   docusate sodium 100 MG capsule Commonly known as: COLACE Take 1 capsule (100 mg total) by mouth 2 (two) times daily as needed for mild constipation.   docusate 50 MG/5ML liquid Commonly known as: COLACE Place 10 mLs (100 mg total) into feeding tube 2 (two) times daily.   fentaNYL 10 mcg/ml Soln infusion Inject 50-200 mcg/hr into the vein continuous.   Insulin Regular(Human) in NaCl 100-0.9  UT/100ML-% Soln infusion Commonly known as: MYXREDLIN Inject 1 Units/hr into the vein continuous.   lactated ringers infusion Inject 125 mLs into the vein continuous.   linezolid 600 MG/300ML IVPB Commonly known as: ZYVOX Inject 300 mLs (600 mg total) into the vein every 12 (twelve) hours.   mouth rinse Liqd solution 15 mLs by Mouth Rinse route every 2 (two) hours.   mouth rinse Liqd solution 15 mLs by Mouth Rinse route as needed (oral care).   piperacillin-tazobactam 3.375 GM/50ML IVPB Commonly known as: ZOSYN Inject 50 mLs (3.375 g total) into the vein every 8 (eight) hours.   polyethylene glycol 17 g packet Commonly known as: MIRALAX / GLYCOLAX Take 17 g by mouth daily as needed for moderate constipation.   polyethylene glycol 17 g packet Commonly known as: MIRALAX / GLYCOLAX Place 17 g into feeding tube daily.   propofol 1000 MG/100ML Emul injection Commonly known as: DIPRIVAN Inject [phone] mcg/min into the vein continuous.   sodium chloride 0.9 % infusion Inject 10 mLs into the vein as needed (for administration of IV medications (carrier fluid)).            Disposition: Surgical ICU  Discharged Condition: Tracy Woods has met maximum benefit of inpatient care at [facility] and requires transfer to higher level of care.      Time spent on disposition: 40 Minutes.     Signed: Darel Hong, AGACNP-BC [facility] Prefer epic messenger for cross cover needs If after hours, please call E-link

## 2022-09-16 NOTE — Plan of Care (Signed)
Patient admitted to ICU from OR following abdominal surgery to debride wound. Patient presents to ICU intubated and sedated. Infusions of insulin, fentanyl, and MIVF at this time. Foley, PIVs and Art line in place.   Problem: Education: Goal: Knowledge of General Education information will improve Description: Including pain rating scale, medication(s)/side effects and non-pharmacologic comfort measures Outcome: Progressing   Problem: Health Behavior/Discharge Planning: Goal: Ability to manage health-related needs will improve Outcome: Progressing   Problem: Clinical Measurements: Goal: Ability to maintain clinical measurements within normal limits will improve Outcome: Progressing Goal: Will remain free from infection Outcome: Progressing Goal: Diagnostic test results will improve Outcome: Progressing Goal: Respiratory complications will improve Outcome: Progressing Goal: Cardiovascular complication will be avoided Outcome: Progressing   Problem: Activity: Goal: Risk for activity intolerance will decrease Outcome: Progressing   Problem: Nutrition: Goal: Adequate nutrition will be maintained Outcome: Progressing   Problem: Coping: Goal: Level of anxiety will decrease Outcome: Progressing   Problem: Elimination: Goal: Will not experience complications related to bowel motility Outcome: Progressing Goal: Will not experience complications related to urinary retention Outcome: Progressing   Problem: Pain Managment: Goal: General experience of comfort will improve Outcome: Progressing   Problem: Safety: Goal: Ability to remain free from injury will improve Outcome: Progressing   Problem: Skin Integrity: Goal: Risk for impaired skin integrity will decrease Outcome: Progressing

## 2022-09-16 NOTE — Anesthesia Preprocedure Evaluation (Signed)
   Anesthesia Plan  ASA: 3 and emergent  Anesthesia Plan: General ETT   Post-op Pain Management:    Induction:   PONV Risk Score and Plan: 4 or greater  Airway Management Planned:   Additional Equipment:   Intra-op Plan:   Post-operative Plan:   Informed Consent: I have reviewed the patients History and Physical, chart, labs and discussed the procedure including the risks, benefits and alternatives for the proposed anesthesia with the patient or authorized representative who has indicated his/her understanding and acceptance.     Dental Advisory Given  Plan Discussed with: CRNA  Anesthesia Plan Comments:         Anesthesia Quick Evaluation

## 2022-09-16 NOTE — Procedures (Signed)
Central Venous Catheter Insertion Procedure Note  Tracy Woods  [id number]  Mar 27, 1971  Date:09/16/22  Time:9:05 AM   Provider Performing:Callia Swim D Dewaine Conger   Procedure: Insertion of Non-tunneled Central Venous Catheter(36556) with US guidance (09735)   Indication(s) Medication administration and Difficult access  Consent Unable to obtain consent due to emergent nature of procedure.  Anesthesia Topical only with 1% lidocaine   Timeout Verified patient identification, verified procedure, site/side was marked, verified correct patient position, special equipment/implants available, medications/allergies/relevant history reviewed, required imaging and test results available.  Sterile Technique Maximal sterile technique including full sterile barrier drape, hand hygiene, sterile gown, sterile gloves, mask, hair covering, sterile ultrasound probe cover (if used).  Procedure Description Area of catheter insertion was cleaned with chlorhexidine and draped in sterile fashion.  With real-time ultrasound guidance a central venous catheter was placed into the right internal jugular vein. Nonpulsatile blood flow and easy flushing noted in all ports.  The catheter was sutured in place and sterile dressing applied.  Complications/Tolerance None; patient tolerated the procedure well. Chest X-ray is ordered to verify placement for internal jugular or subclavian cannulation.   Chest x-ray is not ordered for femoral cannulation.  EBL Minimal  Specimen(s) None    Line was secured at the 16 cm mark. BIOPATCH applied to the insertion site.   Darel Hong, AGACNP-BC [facility] Prefer epic messenger for cross cover needs If after hours, please call E-link

## 2022-09-16 NOTE — Plan of Care (Signed)
Patient transferring to [facility] SICU for elevated level of care.   [facility] at the bedside 1100, report given to team.

## 2022-09-16 NOTE — ED Notes (Signed)
Periop team to the bedside to transport patient to the or. She had her gown, shoes, and purse.  She was on her phone leaving messages for her family during transit.  Patient was started on the Endo tool for elevated glucose.  The receiving team made aware of the same and her last reading prior to transport to the OR

## 2022-09-17 LAB — SURGICAL PATHOLOGY

## 2022-09-17 NOTE — Anesthesia Postprocedure Evaluation (Signed)
Anesthesia Post Note  Patient: Tracy Woods  Procedure(s) Performed: IRRIGATION AND DEBRIDEMENT WOUND (Bilateral)  Anesthesia Type: General Anesthetic complications: yes   Encounter Notable Events  Notable Event Outcome Phase Comment  Difficult to intubate - expected  Intraprocedure Filed from anesthesia note documentation.     Last Vitals:  Vitals:   09/16/22 1053 09/16/22 1115  BP: (!) 122/48 (!) 122/48  Pulse: 89 89  Resp: 19 19  Temp: 37 C 37 C  SpO2: 99% 99%    Last Pain:  Vitals:   09/16/22 1115  TempSrc: Esophageal  PainSc:                  Molli Barrows

## 2022-09-19 LAB — AEROBIC/ANAEROBIC CULTURE W GRAM STAIN (SURGICAL/DEEP WOUND): Gram Stain: NONE SEEN

## 2022-09-23 ENCOUNTER — Encounter: Payer: Self-pay | Admitting: General Surgery

## 2022-10-24 DIAGNOSIS — M726 Necrotizing fasciitis: Secondary | ICD-10-CM

## 2022-10-24 HISTORY — DX: Necrotizing fasciitis: M72.6

## 2022-11-18 DIAGNOSIS — Z1211 Encounter for screening for malignant neoplasm of colon: Secondary | ICD-10-CM | POA: Insufficient documentation

## 2022-11-18 DIAGNOSIS — Z4689 Encounter for fitting and adjustment of other specified devices: Secondary | ICD-10-CM | POA: Insufficient documentation

## 2022-11-18 DIAGNOSIS — F419 Anxiety disorder, unspecified: Secondary | ICD-10-CM | POA: Insufficient documentation

## 2022-11-18 DIAGNOSIS — B372 Candidiasis of skin and nail: Secondary | ICD-10-CM | POA: Insufficient documentation

## 2022-11-18 DIAGNOSIS — Z48817 Encounter for surgical aftercare following surgery on the skin and subcutaneous tissue: Secondary | ICD-10-CM | POA: Insufficient documentation

## 2022-11-18 DIAGNOSIS — Z87440 Personal history of urinary (tract) infections: Secondary | ICD-10-CM | POA: Insufficient documentation

## 2022-11-18 DIAGNOSIS — B965 Pseudomonas (aeruginosa) (mallei) (pseudomallei) as the cause of diseases classified elsewhere: Secondary | ICD-10-CM | POA: Insufficient documentation

## 2022-11-18 HISTORY — DX: Personal history of urinary (tract) infections: Z87.440

## 2022-11-25 DIAGNOSIS — U071 COVID-19: Secondary | ICD-10-CM | POA: Insufficient documentation

## 2023-01-14 ENCOUNTER — Other Ambulatory Visit: Payer: Self-pay

## 2023-01-14 ENCOUNTER — Emergency Department: Payer: Medicaid Other

## 2023-01-14 ENCOUNTER — Emergency Department
Admission: EM | Admit: 2023-01-14 | Discharge: 2023-01-15 | Disposition: A | Discharge status: Home / Self Care | Payer: Medicaid Other | Attending: Emergency Medicine | Admitting: Emergency Medicine

## 2023-01-14 DIAGNOSIS — R109 Unspecified abdominal pain: Secondary | ICD-10-CM | POA: Diagnosis present

## 2023-01-14 DIAGNOSIS — R1084 Generalized abdominal pain: Secondary | ICD-10-CM

## 2023-01-14 DIAGNOSIS — B372 Candidiasis of skin and nail: Secondary | ICD-10-CM | POA: Diagnosis not present

## 2023-01-14 DIAGNOSIS — K429 Umbilical hernia without obstruction or gangrene: Secondary | ICD-10-CM | POA: Insufficient documentation

## 2023-01-14 DIAGNOSIS — L03311 Cellulitis of abdominal wall: Secondary | ICD-10-CM

## 2023-01-14 DIAGNOSIS — R8289 Other abnormal findings on cytological and histological examination of urine: Secondary | ICD-10-CM | POA: Diagnosis not present

## 2023-01-14 DIAGNOSIS — E119 Type 2 diabetes mellitus without complications: Secondary | ICD-10-CM | POA: Diagnosis not present

## 2023-01-14 LAB — URINALYSIS, W/ REFLEX TO CULTURE (INFECTION SUSPECTED)
Bilirubin Urine: NEGATIVE
Glucose, UA: NEGATIVE mg/dL
Hgb urine dipstick: NEGATIVE
Ketones, ur: NEGATIVE mg/dL
Nitrite: NEGATIVE
Protein, ur: 100 mg/dL — AB
Specific Gravity, Urine: 1.018 (ref 1.005–1.030)
pH: 6 (ref 5.0–8.0)

## 2023-01-14 LAB — CBC WITH DIFFERENTIAL/PLATELET
Abs Immature Granulocytes: 0.03 10*3/uL (ref 0.00–0.07)
Basophils Absolute: 0 10*3/uL (ref 0.0–0.1)
Basophils Relative: 1 %
Eosinophils Absolute: 0.3 10*3/uL (ref 0.0–0.5)
Eosinophils Relative: 5 %
HCT: 40.3 % (ref 36.0–46.0)
Hemoglobin: 12.6 g/dL (ref 12.0–15.0)
Immature Granulocytes: 1 %
Lymphocytes Relative: 33 %
Lymphs Abs: 2.1 10*3/uL (ref 0.7–4.0)
MCH: 26 pg (ref 26.0–34.0)
MCHC: 31.3 g/dL (ref 30.0–36.0)
MCV: 83.3 fL (ref 80.0–100.0)
Monocytes Absolute: 0.3 10*3/uL (ref 0.1–1.0)
Monocytes Relative: 5 %
Neutro Abs: 3.6 10*3/uL (ref 1.7–7.7)
Neutrophils Relative %: 55 %
Platelets: 319 10*3/uL (ref 150–400)
RBC: 4.84 MIL/uL (ref 3.87–5.11)
RDW: 13.2 % (ref 11.5–15.5)
WBC: 6.4 10*3/uL (ref 4.0–10.5)
nRBC: 0 % (ref 0.0–0.2)

## 2023-01-14 LAB — COMPREHENSIVE METABOLIC PANEL
ALT: 23 U/L (ref 0–44)
AST: 33 U/L (ref 15–41)
Albumin: 3.3 g/dL — ABNORMAL LOW (ref 3.5–5.0)
Alkaline Phosphatase: 86 U/L (ref 38–126)
Anion gap: 11 (ref 5–15)
BUN: 17 mg/dL (ref 6–20)
CO2: 22 mmol/L (ref 22–32)
Calcium: 9.2 mg/dL (ref 8.9–10.3)
Chloride: 103 mmol/L (ref 98–111)
Creatinine, Ser: 0.62 mg/dL (ref 0.44–1.00)
GFR, Estimated: >60 mL/min (ref >60)
Glucose, Bld: 216 mg/dL — ABNORMAL HIGH (ref 70–99)
Potassium: 4 mmol/L (ref 3.5–5.1)
Sodium: 136 mmol/L (ref 135–145)
Total Bilirubin: 0.6 mg/dL (ref 0.3–1.2)
Total Protein: 8.3 g/dL — ABNORMAL HIGH (ref 6.5–8.1)

## 2023-01-14 LAB — LACTIC ACID, PLASMA: Lactic Acid, Venous: 2.2 mmol/L (ref 0.5–1.9)

## 2023-01-14 MED ORDER — KETOROLAC TROMETHAMINE 15 MG/ML IJ SOLN
15.0000 mg | Freq: Once | INTRAMUSCULAR | Status: AC
  Administered 2023-01-14: 15 mg via INTRAVENOUS
  Filled 2023-01-14: qty 1

## 2023-01-14 MED ORDER — FLUCONAZOLE 50 MG PO TABS
150.0000 mg | ORAL_TABLET | Freq: Once | ORAL | Status: AC
  Administered 2023-01-14: 150 mg via ORAL
  Filled 2023-01-14: qty 1

## 2023-01-14 MED ORDER — SULFAMETHOXAZOLE-TRIMETHOPRIM 800-160 MG PO TABS: 1.0000 | ORAL_TABLET | Freq: Two times a day (BID) | ORAL | 0 refills | Status: DC

## 2023-01-14 MED ORDER — HYDROXYZINE HCL 25 MG PO TABS: 25.0000 mg | ORAL_TABLET | Freq: Two times a day (BID) | ORAL | 0 refills | Status: DC | PRN

## 2023-01-14 MED ORDER — HYDROXYZINE HCL 25 MG PO TABS: 25.0000 mg | ORAL_TABLET | Freq: Two times a day (BID) | ORAL | 0 refills | Status: AC | PRN

## 2023-01-14 MED ORDER — SODIUM CHLORIDE 0.9 % IV BOLUS
1000.0000 mL | Freq: Once | INTRAVENOUS | Status: AC
  Administered 2023-01-14: 1000 mL via INTRAVENOUS

## 2023-01-14 MED ORDER — SULFAMETHOXAZOLE-TRIMETHOPRIM 800-160 MG PO TABS
1.0000 | ORAL_TABLET | Freq: Once | ORAL | Status: AC
  Administered 2023-01-14: 1 via ORAL
  Filled 2023-01-14: qty 1

## 2023-01-14 MED ORDER — MORPHINE SULFATE (PF) 4 MG/ML IV SOLN
4.0000 mg | Freq: Once | INTRAVENOUS | Status: AC
  Administered 2023-01-14: 4 mg via INTRAVENOUS
  Filled 2023-01-14: qty 1

## 2023-01-14 MED ORDER — IOHEXOL 350 MG/ML SOLN
125.0000 mL | Freq: Once | INTRAVENOUS | Status: AC | PRN
  Administered 2023-01-14: 125 mL via INTRAVENOUS

## 2023-01-14 MED ORDER — SULFAMETHOXAZOLE-TRIMETHOPRIM 800-160 MG PO TABS: 1.0000 | ORAL_TABLET | Freq: Two times a day (BID) | ORAL | 0 refills | Status: AC

## 2023-01-14 NOTE — Discharge Instructions (Addendum)
Take Bactrim antibiotic for the full course as prescribed.  I also prescribed hydroxyzine for anxiety.  [facility] and ask for these medications to be delivered since they do have a delivery service, as I am told by our hospital pharmacist.  See your doctor for follow-up.  Continue to take nystatin for yeast infection.   Take acetaminophen 650 mg and ibuprofen 400 mg every 6 hours for pain.  Take with food.  Thank you for choosing [place] for your health care today!  Please see your primary doctor this week for a follow up appointment.   Sometimes, in the early stages of certain disease courses it is difficult to detect in the emergency department evaluation -- so, it is important that you continue to monitor your symptoms and call your doctor right away or return to the emergency department if you develop any new or worsening symptoms.  Please go to the following website to schedule new (and existing) patient appointments:   [email or web address]  If you do not have a primary doctor try calling the following clinics to establish care:  If you have insurance:  [facility] [phone] [place] [address]   [facility]  [phone] [place] [place] [address]   If you do not have insurance:  [facility]  [phone] [address] [place] [address]   The following is another list of primary care offices in the area who are accepting new patients at this time.  Please reach out to one of them directly and let them know you would like to schedule an appointment to follow up on an Emergency Department visit, and/or to establish a new primary care provider (PCP).  There are likely other primary care clinics in the are who are accepting new patients, but this is an excellent place to start:  [place] physician: Dr Lavon Paganini [address] [place], [place] [address] [phone]  [facility] Lead Physician: Dr Steele Sizer [address], [place], [place] [address] [phone]  [place] Physician: Dr Park Liter [address] [place], [place] [address] [phone]  [facility] Lead Physician: Dr Dewaine Oats [place], [place], [place] [address] [phone]  [place] [facility] [place] Physician: Dr Halina Maidens [address] [place], [place] [address] [phone]   It was my pleasure to care for you today.   Ho[place] Brunette Jacelyn Grip, MD    Thank you for choosing [place] for your health care today!  Please see your primary doctor this week for a follow up appointment.   Sometimes, in the early stages of certain disease courses it is difficult to detect in the emergency department evaluation -- so, it is important that you continue to monitor your symptoms and call your doctor right away or return to the emergency department if you develop any new or worsening symptoms.  Please go to the following website to schedule new (and existing) patient appointments:   [email or web address]  If you do not have a primary doctor try calling the following clinics to establish care:  If you have insurance:  [facility] [phone] [place] [address]   [facility]  [phone] [place] [place] [address]   If you do not have insurance:  [facility]  [place] [place] [address]   The following is another list of primary care offices in the area who are accepting new patients at this time.  Please reach out to one of them directly and let them know you would like to schedule an appointment to follow up on an Emergency Department visit, and/or to establish a new primary care provider  (PCP).  There are likely other primary care clinics in the are who are accepting new patients, but this is an excellent place to start:  [place] physician: Dr Lavon Paganini [address] [place], [place] [address] [phone]  [facility] Lead Physician: Dr Steele Sizer [address], [place], [place] [address] [phone]  [place] Physician: Dr Park Liter [address] [place], [place] [address] [phone]  [facility] Lead Physician: Dr Dewaine Oats [place], [place], [place] [address] [phone]  [place] [facility] [place] Physician: Dr Halina Maidens [address] [place], [place] [address] [phone]   It was my pleasure to care for you today.   [place] Brunette Jacelyn Grip, MD

## 2023-01-14 NOTE — ED Triage Notes (Signed)
Patient brought in via [facility] for right lower back pain and intermittent diarrhea x 1 week that has gotten worst, rash to lower abdomen the spread up to umbilicus.

## 2023-01-14 NOTE — ED Provider Notes (Signed)
[facility] Provider Note    Event Date/Time   First MD Initiated Contact with Patient 01/14/23 2043     (approximate)   History   Back Pain   HPI  Tracy Woods is a 52 y.o. female   Past medical history of type 2 diabetes, elevated BMI, candidal intertrigo, history of Fournier's gangrene/necrotizing fasciitis OR debridement in November 2023 who presents to the emergency department with right flank pain as well as rash to the lower abdomen.  Ongoing for 5 to 7 days.  No infectious systemic symptoms like fever or chills.  No nausea vomiting diarrhea.  She has no urinary symptoms.  She has no history of kidney stone.  She has a chronic yeast infection in the folds underneath her abdomen and the perineum that has been treated with nystatin and fluconazole with no marked improvement. Denies trauma or falls.  External Medical Documents Reviewed: Discharge summary from September 16, 2022 for necrotizing fasciitis perineum and lower abdomen      Physical Exam   Triage Vital Signs: ED Triage Vitals  Enc Vitals Group     BP 01/14/23 2057 (!) 157/74     Pulse Rate 01/14/23 2057 78     Resp 01/14/23 2057 18     Temp 01/14/23 2057 98.7 F (37.1 C)     Temp Source 01/14/23 2057 Oral     SpO2 01/14/23 2051 97 %     Weight 01/14/23 2059 (!) 352 lb (159.7 kg)     Height 01/14/23 2059 '5\' 6"'$  (1.676 m)     Head Circumference --      Peak Flow --      Pain Score 01/14/23 2059 5     Pain Loc --      Pain Edu? --      Excl. in [place]? --     Most recent vital signs: Vitals:   01/14/23 2051 01/14/23 2057  BP:  (!) 157/74  Pulse:  78  Resp:  18  Temp:  98.7 F (37.1 C)  SpO2: 97% 96%    General: Awake, no distress.  CV:  Good peripheral perfusion.  Resp:  Normal effort.  Abd:  No distention.  Other:  Wake alert comfortable appearing nontoxic normal vital signs slightly hypertensive no fever.  She has some candidal red rashes isolated to the folds of  her lower abdomen and perineum but no pain out of proportion or crepitus in the area.  She does have diffuse abdominal tenderness to palpation though is soft and no rigidity or guarding.  She has right sided see tenderness.  Lungs clear.  Chronic reducible nontender umbilical hernia   ED Results / Procedures / Treatments   Labs (all labs ordered are listed, but only abnormal results are displayed) Labs Reviewed  COMPREHENSIVE METABOLIC PANEL - Abnormal; Notable for the following components:      Result Value   Glucose, Bld 216 (*)    Total Protein 8.3 (*)    Albumin 3.3 (*)    All other components within normal limits  LACTIC ACID, PLASMA - Abnormal; Notable for the following components:   Lactic Acid, Venous 2.2 (*)    All other components within normal limits  URINALYSIS, W/ REFLEX TO CULTURE (INFECTION SUSPECTED) - Abnormal; Notable for the following components:   Color, Urine YELLOW (*)    APPearance HAZY (*)    Protein, ur 100 (*)    Leukocytes,Ua TRACE (*)    Bacteria, UA RARE (*)  All other components within normal limits  CBC WITH DIFFERENTIAL/PLATELET  LACTIC ACID, PLASMA     I ordered and reviewed the above labs they are notable for normal white blood cell count and electrolytes unremarkable   RADIOLOGY I independently reviewed and interpreted CT scan of the abdomen pelvis and see no obvious obstructive or infectious signs   PROCEDURES:  Critical Care performed: No  Procedures   MEDICATIONS ORDERED IN ED: Medications  sulfamethoxazole-trimethoprim (BACTRIM DS) 800-160 MG per tablet 1 tablet (has no administration in time range)  sodium chloride 0.9 % bolus 1,000 mL (has no administration in time range)  morphine (PF) 4 MG/ML injection 4 mg (4 mg Intravenous Given 01/14/23 2209)  ketorolac (TORADOL) 15 MG/ML injection 15 mg (15 mg Intravenous Given 01/14/23 2209)  fluconazole (DIFLUCAN) tablet 150 mg (150 mg Oral Given 01/14/23 2209)  iohexol (OMNIPAQUE) 350  MG/ML injection 125 mL (125 mLs Intravenous Contrast Given 01/14/23 2154)     IMPRESSION / MDM / [place] / ED COURSE  I reviewed the triage vital signs and the nursing notes.                                Patient's presentation is most consistent with acute presentation with potential threat to life or bodily function.  Differential diagnosis includes, but is not limited to, pyelonephritis, UTI, intra-abdominal infection like appendicitis or cholecystitis, nephrolithiasis/renal colic, yeast infection, MSK pain, lumbar strain; considered but less likely necrotizing fasciitis abscess cellulitis   The patient is on the cardiac monitor to evaluate for evidence of arrhythmia and/or significant heart rate changes.  MDM: Chief complaint is right flank pain.  Check urinalysis and CT scan for UTI pyelonephritis or kidney stone.  Of note she also has candidal rash that is refractory to fluconazole trial months ago and ongoing nystatin.  Will give fluconazole once today and have her continue using nystatin.  She has diffuse abdominal tenderness in the there is no crepitus or pain out of proportion she is tender to the area around the perineum and lower abdomen where her prior necrotizing fasciitis was.  CT scan will help evaluate for any signs of emergent infection like abscess/nec fasc - low suspicion.    CT scan with evidence of cellulitis in the lower abdomen, and her urinalysis shows rare bacteria but no white blood cells significant.  Will treat her cellulitis with Bactrim.  Her lactic acidosis slight 2.2 will give IV fluids and recheck.  She otherwise appears well normal vital signs afebrile no elevation of white blood cell count so I think if lactic is downtrending she can be discharged with an oral  antibiotic prescription.         FINAL CLINICAL IMPRESSION(S) / ED DIAGNOSES   Final diagnoses:  Candidal skin infection  Right flank pain  Generalized abdominal pain  Cellulitis  of abdominal wall     Rx / DC Orders   ED Discharge Orders          Ordered    sulfamethoxazole-trimethoprim (BACTRIM DS) 800-160 MG tablet  2 times daily        01/14/23 2254    hydrOXYzine (ATARAX) 25 MG tablet  2 times daily PRN        01/14/23 2325             Note:  This document was prepared using Dragon voice recognition software and may include unintentional dictation errors.  Lucillie Garfinkel, MD 01/14/23 [phone]

## 2023-01-15 ENCOUNTER — Emergency Department: Admit: 2023-01-15 | Payer: MEDICAID | Source: Home / Self Care

## 2023-01-15 LAB — LACTIC ACID, PLASMA: Lactic Acid, Venous: 1.2 mmol/L (ref 0.5–1.9)

## 2023-01-15 MED ORDER — OXYCODONE-ACETAMINOPHEN 5-325 MG PO TABS
1.0000 | ORAL_TABLET | Freq: Once | ORAL | Status: AC
  Administered 2023-01-15: 1 via ORAL
  Filled 2023-01-15: qty 1

## 2023-01-15 MED ORDER — OXYCODONE-ACETAMINOPHEN 5-325 MG PO TABS: 1.0000 | ORAL_TABLET | ORAL | 0 refills | Status: DC | PRN

## 2023-01-15 NOTE — ED Provider Notes (Signed)
-----------------------------------------   1:56 AM on 01/15/2023 -----------------------------------------   Repeat lactic acid down to 1.2.  Will discharge home per previous providers plan on oral antibiotics.  Strict return precautions given.  Patient verbalizes understanding and agrees with plan of care.   Paulette Blanch, MD 01/15/23 [phone]

## 2023-01-15 NOTE — ED Notes (Signed)
Pt without assistance nor family to provide safe transportation. Pt arrived to ED by EMS and in need to return home but not able to walk without walker or able to walk distance without assistance.   Transportation arranged for convalescent services back to pts home.

## 2023-04-03 ENCOUNTER — Emergency Department
Admission: EM | Admit: 2023-04-03 | Discharge: 2023-04-03 | Disposition: A | Discharge status: Home / Self Care | Payer: Medicaid Other | Attending: Emergency Medicine | Admitting: Emergency Medicine

## 2023-04-03 ENCOUNTER — Other Ambulatory Visit: Payer: Self-pay

## 2023-04-03 ENCOUNTER — Encounter: Payer: Self-pay | Admitting: Emergency Medicine

## 2023-04-03 DIAGNOSIS — E119 Type 2 diabetes mellitus without complications: Secondary | ICD-10-CM | POA: Diagnosis not present

## 2023-04-03 DIAGNOSIS — B372 Candidiasis of skin and nail: Secondary | ICD-10-CM | POA: Insufficient documentation

## 2023-04-03 DIAGNOSIS — G5783 Other specified mononeuropathies of bilateral lower limbs: Secondary | ICD-10-CM | POA: Diagnosis not present

## 2023-04-03 DIAGNOSIS — G5793 Unspecified mononeuropathy of bilateral lower limbs: Secondary | ICD-10-CM

## 2023-04-03 DIAGNOSIS — K429 Umbilical hernia without obstruction or gangrene: Secondary | ICD-10-CM | POA: Diagnosis not present

## 2023-04-03 LAB — CBC
HCT: 41.9 % (ref 36.0–46.0)
Hemoglobin: 13.5 g/dL (ref 12.0–15.0)
MCH: 26.7 pg (ref 26.0–34.0)
MCHC: 32.2 g/dL (ref 30.0–36.0)
MCV: 83 fL (ref 80.0–100.0)
Platelets: 316 10*3/uL (ref 150–400)
RBC: 5.05 MIL/uL (ref 3.87–5.11)
RDW: 14.6 % (ref 11.5–15.5)
WBC: 7.9 10*3/uL (ref 4.0–10.5)
nRBC: 0 % (ref 0.0–0.2)

## 2023-04-03 LAB — COMPREHENSIVE METABOLIC PANEL
ALT: 21 U/L (ref 0–44)
AST: 25 U/L (ref 15–41)
Albumin: 3.2 g/dL — ABNORMAL LOW (ref 3.5–5.0)
Alkaline Phosphatase: 103 U/L (ref 38–126)
Anion gap: 9 (ref 5–15)
BUN: 21 mg/dL — ABNORMAL HIGH (ref 6–20)
CO2: 23 mmol/L (ref 22–32)
Calcium: 9 mg/dL (ref 8.9–10.3)
Chloride: 105 mmol/L (ref 98–111)
Creatinine, Ser: 0.62 mg/dL (ref 0.44–1.00)
GFR, Estimated: >60 mL/min (ref >60)
Glucose, Bld: 152 mg/dL — ABNORMAL HIGH (ref 70–99)
Potassium: 4.2 mmol/L (ref 3.5–5.1)
Sodium: 137 mmol/L (ref 135–145)
Total Bilirubin: 0.7 mg/dL (ref 0.3–1.2)
Total Protein: 7.9 g/dL (ref 6.5–8.1)

## 2023-04-03 LAB — CBG MONITORING, ED: Glucose-Capillary: 125 mg/dL — ABNORMAL HIGH (ref 70–99)

## 2023-04-03 MED ORDER — SULFAMETHOXAZOLE-TRIMETHOPRIM 800-160 MG PO TABS: 1.0000 | ORAL_TABLET | Freq: Two times a day (BID) | ORAL | 0 refills | Status: AC

## 2023-04-03 MED ORDER — FLUCONAZOLE 100 MG PO TABS: 400.0000 mg | ORAL_TABLET | ORAL | 0 refills | Status: AC

## 2023-04-03 MED ORDER — ONDANSETRON HCL 4 MG/2ML IJ SOLN
4.0000 mg | Freq: Once | INTRAMUSCULAR | Status: AC
  Administered 2023-04-03: 4 mg via INTRAVENOUS
  Filled 2023-04-03: qty 2

## 2023-04-03 MED ORDER — OXYCODONE-ACETAMINOPHEN 7.5-325 MG PO TABS: 1.0000 | ORAL_TABLET | ORAL | 0 refills | Status: DC | PRN

## 2023-04-03 MED ORDER — HYDROMORPHONE HCL 1 MG/ML IJ SOLN
1.0000 mg | Freq: Once | INTRAMUSCULAR | Status: AC
  Administered 2023-04-03: 1 mg via INTRAVENOUS
  Filled 2023-04-03: qty 1

## 2023-04-03 MED ORDER — HYDROXYZINE HCL 25 MG PO TABS: 25.0000 mg | ORAL_TABLET | Freq: Three times a day (TID) | ORAL | 0 refills | Status: DC | PRN

## 2023-04-03 MED ORDER — SODIUM CHLORIDE 0.9 % IV BOLUS
1000.0000 mL | Freq: Once | INTRAVENOUS | Status: AC
  Administered 2023-04-03: 1000 mL via INTRAVENOUS

## 2023-04-03 NOTE — ED Notes (Signed)
Lactic acid sent to lab if needed.

## 2023-04-03 NOTE — ED Provider Notes (Signed)
[facility] Provider Note    Event Date/Time   First MD Initiated Contact with Patient 04/03/23 [id number]     (approximate)   History   Wound Check   HPI  Tracy Woods is a 52 y.o. female with tree of type 2 diabetes, Fournier's gangrene, severe sepsis chronic Candida infection, umbilical hernia, and as listed in EMR presents to the emergency department for treatment and evaluation of pain in the skin folds under her abdomen, labia, groin, and buttocks.  She has been using nystatin powder and antifungals as previously advised without any relief.  She is also having pain in the upper thighs and great toes..      Physical Exam   Triage Vital Signs: ED Triage Vitals [04/03/23 0832]  Enc Vitals Group     BP (!) 154/97     Pulse Rate 85     Resp 18     Temp 97.6 F (36.4 C)     Temp Source Oral     SpO2 99 %     Weight (!) 352 lb 1.2 oz (159.7 kg)     Height 5\' 6"  (1.676 m)     Head Circumference      Peak Flow      Pain Score      Pain Loc      Pain Edu?      Excl. in [place]?     Most recent vital signs: Vitals:   04/03/23 0832  BP: (!) 154/97  Pulse: 85  Resp: 18  Temp: 97.6 F (36.4 C)  SpO2: 99%    General: Awake, no distress.  CV:  Good peripheral perfusion.  Resp:  Normal effort. Breath sounds clear Abd:  No focal tenderness. Large panus with easily reducible umbilical hernia. Widespread erythema under panus, labia, inner thighs, and groin bilaterally. No crepitus. No obvious abscess. No areas of drainage. Other:  Decreased sensation to bilateral great toes. No wounds noted. She is able to demonstrate FROM.  ED Results / Procedures / Treatments   Labs (all labs ordered are listed, but only abnormal results are displayed) Labs Reviewed  COMPREHENSIVE METABOLIC PANEL - Abnormal; Notable for the following components:      Result Value   Glucose, Bld 152 (*)    BUN 21 (*)    Albumin 3.2 (*)    All other components within normal  limits  CBG MONITORING, ED - Abnormal; Notable for the following components:   Glucose-Capillary 125 (*)    All other components within normal limits  CBC     EKG  Not indicated.   RADIOLOGY  Image and radiology report reviewed and interpreted by me. Radiology report consistent with the same.  Not indicated.  PROCEDURES:  Critical Care performed: No  Procedures   MEDICATIONS ORDERED IN ED:  Medications  sodium chloride 0.9 % bolus 1,000 mL (0 mLs Intravenous Stopped 04/03/23 1400)  HYDROmorphone (DILAUDID) injection 1 mg (1 mg Intravenous Given 04/03/23 0946)  ondansetron (ZOFRAN) injection 4 mg (4 mg Intravenous Given 04/03/23 0947)     IMPRESSION / MDM / ASSESSMENT AND PLAN / ED COURSE   I have reviewed the triage note.  Differential diagnosis includes, but is not limited to, Candidal skin infection, abscess, neuropathy, cellulitis  Patient's presentation is most consistent with severe exacerbation of chronic illness.  52 year old female presents to the emergency department for treatment and evaluation of pain in multiple areas. Exam shows erythema in multiple skin folds consistent  with candida. No indication of new abscess. Pannus is soft. Labs are pending. Vital signs without indication of sepsis.   Chart reviewed from hospitalization in November during which she had necrotizing fasciitis of the abdominal wall and perineum. Today, no crepitus, generalized erythema, pain other than in areas of skin folds secondary to yeast, or new skin lesions.   Labs overall reassuring. No leukocytosis, non-fasting glucose is 125.   Case discussed with ED attending who also evaluated the patient. Plan will be to treat her with high dose fluconazole to be taken weekly x 2. Antibiotic to be prescribed in case there is a cellulitis that is not visible due to body habitus. Patient is also requesting hydroxyzine to help with the itching and something to help with pain in general. She  reports that finding a PCP has been difficult due to her lack of mobility. She reports trying someone on "an app," but the person wasn't licensed in [place] to prescribe what she needed. PCP referral entered today. She was advised to return to the ER if she develops a fever, pain worsens, or she develops new symptoms of concern if unable to see PCP.        FINAL CLINICAL IMPRESSION(S) / ED DIAGNOSES   Final diagnoses:  Candidal skin infection  Neuropathy of both feet     Rx / DC Orders   ED Discharge Orders          Ordered    Ambulatory Referral to Primary Care (Establish Care)        04/03/23 0947    sulfamethoxazole-trimethoprim (BACTRIM DS) 800-160 MG tablet  2 times daily        04/03/23 1122    fluconazole (DIFLUCAN) 100 MG tablet  Weekly        04/03/23 1122    Ambulatory Referral to Primary Care (Establish Care)        04/03/23 1122    oxyCODONE-acetaminophen (PERCOCET) 7.5-325 MG tablet  Every 4 hours PRN        04/03/23 1200    hydrOXYzine (ATARAX) 25 MG tablet  3 times daily PRN        04/03/23 1200             Note:  This document was prepared using Dragon voice recognition software and may include unintentional dictation errors.   Chinita Pester, FNP 04/03/23 1420    Pilar Jarvis, MD 04/03/23 [phone]

## 2023-04-03 NOTE — ED Triage Notes (Addendum)
Pt here via [facility] with a wound check from home. Pt states she has a right side groin wound that is painful, pain starting in her back radiating down her legs. Pt is not on abx. Pt states wound is hot to touch but not draining. Pt also states she is newly diabetic but has not taking her medication.   141/101 89 98% RA

## 2023-04-29 ENCOUNTER — Encounter: Payer: Self-pay | Admitting: Nurse Practitioner

## 2023-04-29 ENCOUNTER — Ambulatory Visit: Payer: Medicaid Other | Admitting: Nurse Practitioner

## 2023-04-29 DIAGNOSIS — F419 Anxiety disorder, unspecified: Secondary | ICD-10-CM | POA: Diagnosis not present

## 2023-04-29 DIAGNOSIS — F32A Depression, unspecified: Secondary | ICD-10-CM

## 2023-04-29 DIAGNOSIS — R0600 Dyspnea, unspecified: Secondary | ICD-10-CM | POA: Diagnosis not present

## 2023-04-29 DIAGNOSIS — Z6841 Body Mass Index (BMI) 40.0 and over, adult: Secondary | ICD-10-CM

## 2023-04-29 DIAGNOSIS — E1169 Type 2 diabetes mellitus with other specified complication: Secondary | ICD-10-CM | POA: Diagnosis not present

## 2023-04-29 DIAGNOSIS — Z7984 Long term (current) use of oral hypoglycemic drugs: Secondary | ICD-10-CM

## 2023-04-29 DIAGNOSIS — G629 Polyneuropathy, unspecified: Secondary | ICD-10-CM

## 2023-04-29 MED ORDER — HYDROXYZINE PAMOATE 50 MG PO CAPS: 50.0000 mg | ORAL_CAPSULE | Freq: Three times a day (TID) | ORAL | 0 refills | Status: DC | PRN

## 2023-04-29 MED ORDER — GABAPENTIN 300 MG PO CAPS: 300.0000 mg | ORAL_CAPSULE | Freq: Three times a day (TID) | ORAL | 3 refills | Status: DC

## 2023-04-29 MED ORDER — MELOXICAM 7.5 MG PO TABS: 7.5000 mg | ORAL_TABLET | Freq: Every day | ORAL | 0 refills | Status: DC

## 2023-04-29 MED ORDER — NYSTATIN 100000 UNIT/GM EX POWD: 1.0000 | Freq: Three times a day (TID) | CUTANEOUS | 1 refills | Status: DC

## 2023-04-29 NOTE — Progress Notes (Signed)
New Patient Office Visit  Subjective    Patient ID: Tracy Woods, female    DOB: 1970-11-30  Age: 52 y.o. MRN: [id number]  CC:  Chief Complaint  Patient presents with   Establish Care    HPI Tracy Woods presents to establish care. Tracy Woods has not seen PCP in a while. Tracy Woods has history of diabetes, hypertension and neuropathy. Tracy Woods is not taking anything for diabetes.   Tracy Woods complaint of pain in the back and thigh. Tracy Woods has erythema in the skin folds, arm pit, under the breast and skin fold under the abdomen.   Tracy Woods has shortness of breath with movements.  No PFTs on file.  Denies shortness of breath while sitting.  Tracy Woods also reports of pain due to neuropathy. Health Maintenance  Topic Date Due   COVID-19 Vaccine (1) Never done   FOOT EXAM  Never done   OPHTHALMOLOGY EXAM  Never done   Diabetic kidney evaluation - Urine ACR  Never done   Hepatitis C Screening  Never done   DTaP/Tdap/Td (1 - Tdap) Never done   PAP SMEAR-Modifier  Never done   Colonoscopy  Never done   MAMMOGRAM  Never done   Zoster Vaccines- Shingrix (1 of 2) Never done   INFLUENZA VACCINE  06/12/2023   HEMOGLOBIN A1C  10/29/2023   Diabetic kidney evaluation - eGFR measurement  05/01/2024   HIV Screening  Completed   HPV VACCINES  Aged Out    There are no preventive care reminders to display for this patient.  Outpatient Encounter Medications as of 04/29/2023  Medication Sig   Chlorhexidine Gluconate Cloth 2 % PADS Apply 6 each topically daily.   dextrose 50 % solution Inject 0-50 mLs into the vein as needed for low blood sugar.   Dextrose in Lactated Ringers (DEXTROSE 5% LACTATED RINGERS) 5 % infusion Inject 125 mL/hr into the vein continuous.   docusate (COLACE) 50 MG/5ML liquid Place 10 mLs (100 mg total) into feeding tube 2 (two) times daily.   docusate sodium (COLACE) 100 MG capsule Take 1 capsule (100 mg total) by mouth 2 (two) times daily as needed for mild constipation.   fentaNYL 10 mcg/ml SOLN  infusion Inject 50-200 mcg/hr into the vein continuous.   gabapentin (NEURONTIN) 300 MG capsule Take 1 capsule (300 mg total) by mouth 3 (three) times daily.   hydrOXYzine (VISTARIL) 50 MG capsule Take 1 capsule (50 mg total) by mouth 3 (three) times daily as needed.   insulin regular, human (MYXREDLIN) 100 units/ 100 mL infusion Inject 1 Units/hr into the vein continuous.   lactated ringers infusion Inject 125 mLs into the vein continuous.   meloxicam (MOBIC) 7.5 MG tablet Take 1 tablet (7.5 mg total) by mouth daily.   metFORMIN (GLUCOPHAGE) 500 MG tablet Take 1 tablet (500 mg total) by mouth 2 (two) times daily with a meal.   Mouthwashes (MOUTH RINSE) LIQD solution 15 mLs by Mouth Rinse route every 2 (two) hours.   Mouthwashes (MOUTH RINSE) LIQD solution 15 mLs by Mouth Rinse route as needed (oral care).   norepinephrine (LEVOPHED) 4-5 MG/250ML-% SOLN Inject 0-40 mcg/min into the vein continuous.   nystatin (MYCOSTATIN/NYSTOP) powder Apply 1 Application topically 3 (three) times daily.   polyethylene glycol (MIRALAX / GLYCOLAX) 17 g packet Take 17 g by mouth daily as needed for moderate constipation.   polyethylene glycol (MIRALAX / GLYCOLAX) 17 g packet Place 17 g into feeding tube daily.   propofol (DIPRIVAN) 1000 MG/100ML EMUL injection Inject  [phone] mcg/min into the vein continuous.   sodium chloride 0.9 % infusion Inject 10 mLs into the vein as needed (for administration of IV medications (carrier fluid)).   sodium chloride 0.9 % infusion Inject 250 mLs into the vein continuous.   [DISCONTINUED] hydrOXYzine (ATARAX) 25 MG tablet Take 1 tablet (25 mg total) by mouth 3 (three) times daily as needed.   [DISCONTINUED] oxyCODONE-acetaminophen (PERCOCET) 7.5-325 MG tablet Take 1 tablet by mouth every 4 (four) hours as needed for severe pain.   No facility-administered encounter medications on file as of 04/29/2023.    Past Medical History:  Diagnosis Date   Diabetes mellitus without  complication (HCC)    Hypertension     Past Surgical History:  Procedure Laterality Date   INCISION AND DRAINAGE OF WOUND Bilateral 09/15/2022   Procedure: IRRIGATION AND DEBRIDEMENT WOUND;  Surgeon: Carolan Shiver, MD;  Location: [facility] ORS;  Service: General;  Laterality: Bilateral;    Family History  Problem Relation Age of Onset   Stroke Mother    Miscarriages / [place] Mother    Hyperlipidemia Mother    Hypertension Mother    Heart disease Mother    Hearing loss Mother    Diabetes Mother     Social History   Socioeconomic History   Marital status: Single    Spouse name: Not on file   Number of children: Not on file   Years of education: Not on file   Highest education level: Not on file  Occupational History   Not on file  Tobacco Use   Smoking status: Former    Types: Cigarettes   Smokeless tobacco: Never  Substance and Sexual Activity   Alcohol use: Not Currently   Drug use: Not Currently   Sexual activity: Not on file  Other Topics Concern   Not on file  Social History Narrative   Not on file   Social Determinants of Health   Financial Resource Strain: Not on file  Food Insecurity: Not on file  Transportation Needs: Not on file  Physical Activity: Not on file  Stress: Not on file  Social Connections: Not on file  Intimate Partner Violence: Not on file    Review of Systems  Constitutional:  Negative for fever and weight loss.  HENT:  Negative for hearing loss and nosebleeds.   Eyes:  Negative for blurred vision and discharge.  Respiratory:  Positive for shortness of breath. Negative for cough.   Cardiovascular:  Negative for chest pain and leg swelling.  Gastrointestinal:  Negative for constipation, diarrhea and vomiting.  Genitourinary: Negative.   Musculoskeletal: Negative.   Neurological:  Negative for dizziness, weakness and headaches.  Psychiatric/Behavioral:  Negative for depression and suicidal ideas. The patient is not  nervous/anxious and does not have insomnia.    Negative unless indicated in HPI.      Objective    BP 138/82   Pulse 98   Temp 98.5 F (36.9 C) (Oral)   Ht 5\' 6"  (1.676 m)   Wt (!) 354 lb 9.6 oz (160.8 kg)   SpO2 97%   BMI 57.23 kg/m   Physical Exam Constitutional:      Appearance: Normal appearance.  HENT:     Right Ear: Tympanic membrane normal.     Left Ear: Tympanic membrane normal.     Nose: Nose normal.  Cardiovascular:     Rate and Rhythm: Normal rate and regular rhythm.     Pulses: Normal pulses.     Heart sounds:  Normal heart sounds. No murmur heard. Pulmonary:     Effort: Pulmonary effort is normal.     Breath sounds: Normal breath sounds. No stridor. No wheezing.  Musculoskeletal:        General: Swelling (Lower extremity) present.  Skin:    Findings: Erythema and rash present. Rash is purpuric.     Comments: Skin folds to the abdomen, groin, under the breast  Neurological:     General: No focal deficit present.     Mental Status: Tracy Woods is alert and oriented to person, place, and time. Mental status is at baseline.  Psychiatric:        Mood and Affect: Mood normal.        Behavior: Behavior normal.        Thought Content: Thought content normal.        Judgment: Judgment normal.         Assessment & Plan:  Morbid obesity (HCC) Assessment & Plan: Body mass index is 57.23 kg/m. Advised pt to lose weight. Advised patient to avoid trans fat, fatty and fried food. Follow a regular physical activity schedule. Went over the risk of chronic diseases with increased weight.      Orders: -     Lipid panel -     Hemoglobin A1c -     Thyroid Panel With TSH  Type 2 diabetes mellitus with other specified complication, unspecified whether long term insulin use (HCC) Assessment & Plan: Advised pt to monitor diet. Advised pt to eat variety of food including fruits, vegetables, whole grains, complex carbohydrates and proteins.  Started her on metformin  500 mg daily. Will check hemoglobin A1c today.    Orders: -     Lipid panel -     Hemoglobin A1c -     Thyroid Panel With TSH  Anxiety and depression Assessment & Plan: PHQ-9 score 17 and GAD score 16. Will treat with hydroxyzine for anxiety.   Dyspnea, unspecified type Assessment & Plan: Will refer to pulmonology.   Other orders -     Nystatin; Apply 1 Application topically 3 (three) times daily.  Dispense: 60 g; Refill: 1 -     Meloxicam; Take 1 tablet (7.5 mg total) by mouth daily.  Dispense: 30 tablet; Refill: 0 -     hydrOXYzine Pamoate; Take 1 capsule (50 mg total) by mouth 3 (three) times daily as needed.  Dispense: 90 capsule; Refill: 0 -     Gabapentin; Take 1 capsule (300 mg total) by mouth 3 (three) times daily.  Dispense: 90 capsule; Refill: 3 -     metFORMIN HCl; Take 1 tablet (500 mg total) by mouth 2 (two) times daily with a meal.  Dispense: 180 tablet; Refill: 3    Return in about 2 weeks (around 05/13/2023).   Kara Dies, NP

## 2023-04-29 NOTE — Patient Instructions (Signed)
Rx sent to pharmacy. Labs ordered. Follow up in 2 weeks

## 2023-04-30 LAB — LIPID PANEL
Cholesterol: 242 mg/dL — ABNORMAL HIGH (ref 0–200)
HDL: 55.5 mg/dL (ref >39.00)
LDL Cholesterol: 159 mg/dL — ABNORMAL HIGH (ref 0–99)
NonHDL: 186.75
Total CHOL/HDL Ratio: 4
Triglycerides: 141 mg/dL (ref 0.0–149.0)
VLDL: 28.2 mg/dL (ref 0.0–40.0)

## 2023-04-30 LAB — THYROID PANEL WITH TSH
Free Thyroxine Index: 2.4 (ref 1.4–3.8)
T3 Uptake: 26 % (ref 22–35)
T4, Total: 9.1 ug/dL (ref 5.1–11.9)
TSH: 3.44 mIU/L

## 2023-04-30 LAB — HEMOGLOBIN A1C: Hgb A1c MFr Bld: 7.6 % — ABNORMAL HIGH (ref 4.6–6.5)

## 2023-05-01 ENCOUNTER — Encounter: Payer: Self-pay | Admitting: Nurse Practitioner

## 2023-05-01 MED ORDER — METFORMIN HCL 500 MG PO TABS: 500.0000 mg | ORAL_TABLET | Freq: Two times a day (BID) | ORAL | 3 refills | Status: DC

## 2023-05-01 NOTE — Progress Notes (Signed)
Please inform the patient: Hemoglobin A1c is 7.6 labs sent metformin 500 mg [facility]. Total cholesterol (242) and LDL (159) on higher side advise patient to consume  low fat and  heart healthy diet.  Have sent Mediterranean diet plan via MyChart message.  Will repeat hemoglobin A1c in 3 months please schedule follow-up appointment in 3 months.

## 2023-05-02 ENCOUNTER — Other Ambulatory Visit: Payer: Self-pay

## 2023-05-02 ENCOUNTER — Emergency Department
Admission: EM | Admit: 2023-05-02 | Discharge: 2023-05-02 | Disposition: A | Discharge status: Home / Self Care | Payer: Medicaid Other | Attending: Emergency Medicine | Admitting: Emergency Medicine

## 2023-05-02 ENCOUNTER — Telehealth: Payer: Self-pay

## 2023-05-02 DIAGNOSIS — I10 Essential (primary) hypertension: Secondary | ICD-10-CM | POA: Diagnosis not present

## 2023-05-02 DIAGNOSIS — B372 Candidiasis of skin and nail: Secondary | ICD-10-CM | POA: Diagnosis not present

## 2023-05-02 DIAGNOSIS — R300 Dysuria: Secondary | ICD-10-CM | POA: Diagnosis present

## 2023-05-02 DIAGNOSIS — N3001 Acute cystitis with hematuria: Secondary | ICD-10-CM | POA: Insufficient documentation

## 2023-05-02 DIAGNOSIS — E119 Type 2 diabetes mellitus without complications: Secondary | ICD-10-CM | POA: Insufficient documentation

## 2023-05-02 HISTORY — DX: Type 2 diabetes mellitus without complications: E11.9

## 2023-05-02 HISTORY — DX: Essential (primary) hypertension: I10

## 2023-05-02 LAB — URINALYSIS, W/ REFLEX TO CULTURE (INFECTION SUSPECTED)
Bilirubin Urine: NEGATIVE
Glucose, UA: NEGATIVE mg/dL
Ketones, ur: NEGATIVE mg/dL
Nitrite: NEGATIVE
Protein, ur: 100 mg/dL — AB
RBC / HPF: >50 RBC/hpf (ref 0–5)
Specific Gravity, Urine: 1.021 (ref 1.005–1.030)
WBC, UA: >50 WBC/hpf (ref 0–5)
pH: 5 (ref 5.0–8.0)

## 2023-05-02 LAB — BASIC METABOLIC PANEL
Anion gap: 11 (ref 5–15)
BUN: 17 mg/dL (ref 6–20)
CO2: 20 mmol/L — ABNORMAL LOW (ref 22–32)
Calcium: 8.9 mg/dL (ref 8.9–10.3)
Chloride: 104 mmol/L (ref 98–111)
Creatinine, Ser: 0.62 mg/dL (ref 0.44–1.00)
GFR, Estimated: >60 mL/min (ref >60)
Glucose, Bld: 155 mg/dL — ABNORMAL HIGH (ref 70–99)
Potassium: 4.1 mmol/L (ref 3.5–5.1)
Sodium: 135 mmol/L (ref 135–145)

## 2023-05-02 LAB — CBC
HCT: 41.8 % (ref 36.0–46.0)
Hemoglobin: 13.5 g/dL (ref 12.0–15.0)
MCH: 27.3 pg (ref 26.0–34.0)
MCHC: 32.3 g/dL (ref 30.0–36.0)
MCV: 84.6 fL (ref 80.0–100.0)
Platelets: 306 10*3/uL (ref 150–400)
RBC: 4.94 MIL/uL (ref 3.87–5.11)
RDW: 13.4 % (ref 11.5–15.5)
WBC: 7.5 10*3/uL (ref 4.0–10.5)
nRBC: 0 % (ref 0.0–0.2)

## 2023-05-02 MED ORDER — FLUCONAZOLE 150 MG PO TABS: 150.0000 mg | ORAL_TABLET | ORAL | 0 refills | Status: DC

## 2023-05-02 MED ORDER — DOXYCYCLINE HYCLATE 100 MG PO TABS
100.0000 mg | ORAL_TABLET | Freq: Once | ORAL | Status: AC
  Administered 2023-05-02: 100 mg via ORAL
  Filled 2023-05-02: qty 1

## 2023-05-02 MED ORDER — DOXYCYCLINE HYCLATE 100 MG PO CAPS: 100.0000 mg | ORAL_CAPSULE | Freq: Two times a day (BID) | ORAL | 0 refills | Status: DC

## 2023-05-02 MED ORDER — OXYCODONE HCL 5 MG PO TABS
10.0000 mg | ORAL_TABLET | Freq: Once | ORAL | Status: AC
  Administered 2023-05-02: 10 mg via ORAL
  Filled 2023-05-02: qty 2

## 2023-05-02 MED ORDER — OXYCODONE-ACETAMINOPHEN 7.5-325 MG PO TABS: 1.0000 | ORAL_TABLET | ORAL | 0 refills | Status: AC | PRN

## 2023-05-02 MED ORDER — FLUCONAZOLE 50 MG PO TABS
150.0000 mg | ORAL_TABLET | Freq: Once | ORAL | Status: AC
  Administered 2023-05-02: 150 mg via ORAL
  Filled 2023-05-02: qty 1

## 2023-05-02 NOTE — ED Notes (Signed)
Pt given UA cup and instructed on sample- pt verbalizes understanding and states she will give sample when able to.

## 2023-05-02 NOTE — ED Notes (Signed)
Pt. Discharged, asked to have privacy in room to get dressed prior to leaving ED room. This RN and Emilee, PCT complied.

## 2023-05-02 NOTE — ED Notes (Signed)
Per EMS report, Patient c/o UTI, burning with urination. Patient came from home and walks with a walker.

## 2023-05-02 NOTE — ED Triage Notes (Addendum)
Pt states she thinks she has UTI, has a history of UTI's. Pt c/o left sided flank pain, dysuria and constant burning pain. Pt denies blood in urine and discharge. Pt is AOX4, NAD noted.

## 2023-05-02 NOTE — Progress Notes (Signed)
Noted  

## 2023-05-02 NOTE — ED Notes (Signed)
ED Provider at bedside. 

## 2023-05-02 NOTE — Telephone Encounter (Signed)
LMOM for pt to CB in regards also sent a mychart msg on what Charanpreet said about labs. Charanpreet would like pt scheduled for a 3 month follow up.  If pt calls back just have pt scheduled for 3 month follow up

## 2023-05-02 NOTE — ED Provider Notes (Signed)
[facility] Provider Note    Event Date/Time   First MD Initiated Contact with Patient 05/02/23 1207     (approximate)   History   Dysuria   HPI  Tracy Woods is a 52 y.o. female with history of diabetes, hypertension, chronic yeast infections chronic pain and as listed in EMR presents to the emergency department for treatment and evaluation of left side back pain with dysuria.  No fever, nausea, vomiting.      Physical Exam   Triage Vital Signs: ED Triage Vitals  Enc Vitals Group     BP 05/02/23 1129 (!) 160/97     Pulse Rate 05/02/23 1129 92     Resp 05/02/23 1129 18     Temp 05/02/23 1129 98.3 F (36.8 C)     Temp Source 05/02/23 1129 Oral     SpO2 05/02/23 1129 96 %     Weight --      Height --      Head Circumference --      Peak Flow --      Pain Score 05/02/23 1130 9     Pain Loc --      Pain Edu? --      Excl. in GC? --     Most recent vital signs: Vitals:   05/02/23 1400 05/02/23 1430  BP: (!) 117/58 121/70  Pulse: 90   Resp:    Temp:    SpO2: 94%     General: Awake, no distress.  CV:  Good peripheral perfusion.  Resp:  Normal effort.  Abd:  Difficult to assess secondary to body habitus.  Other:     ED Results / Procedures / Treatments   Labs (all labs ordered are listed, but only abnormal results are displayed) Labs Reviewed  BASIC METABOLIC PANEL - Abnormal; Notable for the following components:      Result Value   CO2 20 (*)    Glucose, Bld 155 (*)    All other components within normal limits  URINALYSIS, W/ REFLEX TO CULTURE (INFECTION SUSPECTED) - Abnormal; Notable for the following components:   Color, Urine YELLOW (*)    APPearance TURBID (*)    Hgb urine dipstick LARGE (*)    Protein, ur 100 (*)    Leukocytes,Ua MODERATE (*)    Bacteria, UA MANY (*)    All other components within normal limits  CBC     EKG  Not indicated.   RADIOLOGY  Image and radiology report reviewed and  interpreted by me. Radiology report consistent with the same.  Not indicated.  PROCEDURES:  Critical Care performed: No  Procedures   MEDICATIONS ORDERED IN ED:  Medications  oxyCODONE (Oxy IR/ROXICODONE) immediate release tablet 10 mg (10 mg Oral Given 05/02/23 1322)  doxycycline (VIBRA-TABS) tablet 100 mg (100 mg Oral Given 05/02/23 1432)  fluconazole (DIFLUCAN) tablet 150 mg (150 mg Oral Given 05/02/23 1432)     IMPRESSION / MDM / ASSESSMENT AND PLAN / ED COURSE   I have reviewed the triage note.  Differential diagnosis includes, but is not limited to, UTI, pyelonephritis, kidney stone  Patient's presentation is most consistent with acute complicated illness / injury requiring diagnostic workup.  52 year old female presents to the emergency department for treatment and evaluation of symptoms as described in the HPI. She is also having an increase in her chronic pain. She did see her PCP and was started on Meloxicam, but doesn't feel that it is helping. She is waiting  to hear back from PCP regarding pain management.  Urinalysis is consistent with acute cystitis. She will be discharged home on doxycycline and diflucan. Short course of pain medication submitted to her pharmacy, but was advised to take it very sparingly.   Prior to discharge, she requested to speak with social worker regarding difficulty paying for medications and basic needs. Consult requested.      FINAL CLINICAL IMPRESSION(S) / ED DIAGNOSES   Final diagnoses:  Acute cystitis with hematuria  Cutaneous candidiasis     Rx / DC Orders   ED Discharge Orders          Ordered    doxycycline (VIBRAMYCIN) 100 MG capsule  2 times daily        05/02/23 1421    fluconazole (DIFLUCAN) 150 MG tablet  Weekly        05/02/23 1421    oxyCODONE-acetaminophen (PERCOCET) 7.5-325 MG tablet  Every 4 hours PRN        05/02/23 1421             Note:  This document was prepared using Dragon voice recognition  software and may include unintentional dictation errors.   Chinita Pester, FNP 05/02/23 1442    Merwyn Katos, MD 05/02/23 [phone]

## 2023-05-10 ENCOUNTER — Encounter: Payer: Self-pay | Admitting: Nurse Practitioner

## 2023-05-10 DIAGNOSIS — G629 Polyneuropathy, unspecified: Secondary | ICD-10-CM | POA: Insufficient documentation

## 2023-05-10 DIAGNOSIS — E1169 Type 2 diabetes mellitus with other specified complication: Secondary | ICD-10-CM | POA: Insufficient documentation

## 2023-05-10 DIAGNOSIS — F419 Anxiety disorder, unspecified: Secondary | ICD-10-CM

## 2023-05-10 DIAGNOSIS — R0600 Dyspnea, unspecified: Secondary | ICD-10-CM | POA: Insufficient documentation

## 2023-05-10 HISTORY — DX: Morbid (severe) obesity due to excess calories: E66.01

## 2023-05-10 HISTORY — DX: Anxiety disorder, unspecified: F41.9

## 2023-05-10 HISTORY — DX: Polyneuropathy, unspecified: G62.9

## 2023-05-10 NOTE — Assessment & Plan Note (Signed)
Advised pt to monitor diet. Advised pt to eat variety of food including fruits, vegetables, whole grains, complex carbohydrates and proteins.  Started her on metformin 500 mg daily. Will check hemoglobin A1c today.

## 2023-05-10 NOTE — Assessment & Plan Note (Signed)
PHQ-9 score 17 and GAD score 16. Will treat with hydroxyzine for anxiety.

## 2023-05-10 NOTE — Assessment & Plan Note (Signed)
Body mass index is 57.23 kg/m. Advised pt to lose weight. Advised patient to avoid trans fat, fatty and fried food. Follow a regular physical activity schedule. Went over the risk of chronic diseases with increased weight.

## 2023-05-10 NOTE — Assessment & Plan Note (Signed)
Will treat with gabapentin.

## 2023-05-10 NOTE — Assessment & Plan Note (Signed)
Will refer to pulmonology.

## 2023-05-25 NOTE — Progress Notes (Deleted)
New patient visit  Patient: Tracy Woods   DOB: [birth year]   52 y.o. Female  MRN: [id number] Visit Date: 05/27/2023  Today's healthcare provider: Debera Lat, PA-C   No chief complaint on file.  Subjective    Tracy Woods is a 52 y.o. female who presents today as a new patient to establish care.  HPI  *** Discussed the use of AI scribe software for clinical note transcription with the patient, who gave verbal consent to proceed.  History of Present Illness            Past Medical History:  Diagnosis Date   Diabetes mellitus without complication (HCC)    Hypertension    Past Surgical History:  Procedure Laterality Date   INCISION AND DRAINAGE OF WOUND Bilateral 09/15/2022   Procedure: IRRIGATION AND DEBRIDEMENT WOUND;  Surgeon: Carolan Shiver, MD;  Location: [facility] ORS;  Service: General;  Laterality: Bilateral;   Family Status  Relation Name Status   Mother  (Not Specified)  No partnership data on file   Family History  Problem Relation Age of Onset   Stroke Mother    Miscarriages / [place] Mother    Hyperlipidemia Mother    Hypertension Mother    Heart disease Mother    Hearing loss Mother    Diabetes Mother    Social History   Socioeconomic History   Marital status: Single    Spouse name: Not on file   Number of children: Not on file   Years of education: Not on file   Highest education level: Not on file  Occupational History   Not on file  Tobacco Use   Smoking status: Former    Types: Cigarettes   Smokeless tobacco: Never  Substance and Sexual Activity   Alcohol use: Not Currently   Drug use: Not Currently   Sexual activity: Not on file  Other Topics Concern   Not on file  Social History Narrative   Not on file   Social Determinants of Health   Financial Resource Strain: Medium Risk (10/02/2022)   Received [facility], [facility]   Overall Financial Resource Strain (CARDIA)    Difficulty of Paying Living  Expenses: Somewhat hard  Food Insecurity: No Food Insecurity (10/02/2022)   [facility], [facility]   Hunger Vital Sign    Worried About Running Out of Food in the Last Year: Never true    Ran Out of Food in the Last Year: Never true  Transportation Needs: Unmet Transportation Needs (11/25/2022)   Received from [facility], [facility]   [facility] - Transportation    Lack of Transportation (Medical): Yes    Lack of Transportation (Non-Medical): Yes  Physical Activity: Not on file  Stress: Not on file  Social Connections: Not on file   Outpatient Medications Prior to Visit  Medication Sig   Chlorhexidine Gluconate Cloth 2 % PADS Apply 6 each topically daily.   dextrose 50 % solution Inject 0-50 mLs into the vein as needed for low blood sugar.   Dextrose in Lactated Ringers (DEXTROSE 5% LACTATED RINGERS) 5 % infusion Inject 125 mL/hr into the vein continuous.   docusate (COLACE) 50 MG/5ML liquid Place 10 mLs (100 mg total) into feeding tube 2 (two) times daily.   docusate sodium (COLACE) 100 MG capsule Take 1 capsule (100 mg total) by mouth 2 (two) times daily as needed for mild constipation.   doxycycline (VIBRAMYCIN) 100 MG capsule Take 1  capsule (100 mg total) by mouth 2 (two) times daily.   fentaNYL 10 mcg/ml SOLN infusion Inject 50-200 mcg/hr into the vein continuous.   fluconazole (DIFLUCAN) 150 MG tablet Take 1 tablet (150 mg total) by mouth once a week.   gabapentin (NEURONTIN) 300 MG capsule Take 1 capsule (300 mg total) by mouth 3 (three) times daily.   hydrOXYzine (VISTARIL) 50 MG capsule Take 1 capsule (50 mg total) by mouth 3 (three) times daily as needed.   insulin regular, human (MYXREDLIN) 100 units/ 100 mL infusion Inject 1 Units/hr into the vein continuous.   lactated ringers infusion Inject 125 mLs into the vein continuous.   meloxicam (MOBIC) 7.5 MG tablet Take 1 tablet (7.5 mg total) by mouth daily.   metFORMIN (GLUCOPHAGE) 500 MG tablet  Take 1 tablet (500 mg total) by mouth 2 (two) times daily with a meal.   Mouthwashes (MOUTH RINSE) LIQD solution 15 mLs by Mouth Rinse route every 2 (two) hours.   Mouthwashes (MOUTH RINSE) LIQD solution 15 mLs by Mouth Rinse route as needed (oral care).   norepinephrine (LEVOPHED) 4-5 MG/250ML-% SOLN Inject 0-40 mcg/min into the vein continuous.   nystatin (MYCOSTATIN/NYSTOP) powder Apply 1 Application topically 3 (three) times daily.   polyethylene glycol (MIRALAX / GLYCOLAX) 17 g packet Take 17 g by mouth daily as needed for moderate constipation.   polyethylene glycol (MIRALAX / GLYCOLAX) 17 g packet Place 17 g into feeding tube daily.   propofol (DIPRIVAN) 1000 MG/100ML EMUL injection Inject 5150533071 mcg/min into the vein continuous.   sodium chloride 0.9 % infusion Inject 10 mLs into the vein as needed (for administration of IV medications (carrier fluid)).   sodium chloride 0.9 % infusion Inject 250 mLs into the vein continuous.   No facility-administered medications prior to visit.   Allergies  Allergen Reactions   Amoxicillin Rash    Tolerated Zosyn 09/16/22 with no apparent issue     There is no immunization history on file for this patient.  Health Maintenance  Topic Date Due   FOOT EXAM  Never done   OPHTHALMOLOGY EXAM  Never done   Diabetic kidney evaluation - Urine ACR  Never done   Hepatitis C Screening  Never done   DTaP/Tdap/Td (1 - Tdap) Never done   PAP SMEAR-Modifier  Never done   Colonoscopy  Never done   MAMMOGRAM  Never done   Zoster Vaccines- Shingrix (1 of 2) Never done   COVID-19 Vaccine (1 - 2023-24 season) Never done   INFLUENZA VACCINE  06/12/2023   HEMOGLOBIN A1C  10/29/2023   Diabetic kidney evaluation - eGFR measurement  05/01/2024   HIV Screening  Completed   HPV VACCINES  Aged Out    Patient Care Team: Kara Dies, NP as PCP - General (Nurse Practitioner)  Review of Systems  All other systems reviewed and are negative.  Except  see HPI   {Insert previous labs (optional):23779}  {See past labs  Heme  Chem  Endocrine  Serology  Results Review (optional):1}   Objective    There were no vitals taken for this visit. {Insert last BP/Wt (optional):23777}  {See vitals history (optional):1}  Physical Exam Vitals reviewed.  Constitutional:      General: She is not in acute distress.    Appearance: Normal appearance. She is well-developed. She is not diaphoretic.  HENT:     Head: Normocephalic and atraumatic.  Eyes:     General: No scleral icterus.    Conjunctiva/sclera: Conjunctivae normal.  Neck:     Thyroid: No thyromegaly.  Cardiovascular:     Rate and Rhythm: Normal rate and regular rhythm.     Pulses: Normal pulses.     Heart sounds: Normal heart sounds. No murmur heard. Pulmonary:     Effort: Pulmonary effort is normal. No respiratory distress.     Breath sounds: Normal breath sounds. No wheezing, rhonchi or rales.  Musculoskeletal:     Cervical back: Neck supple.     Right lower leg: No edema.     Left lower leg: No edema.  Lymphadenopathy:     Cervical: No cervical adenopathy.  Skin:    General: Skin is warm and dry.     Findings: No rash.  Neurological:     Mental Status: She is alert and oriented to person, place, and time. Mental status is at baseline.  Psychiatric:        Mood and Affect: Mood normal.        Behavior: Behavior normal.     Depression Screen    04/29/2023    2:00 PM  PHQ 2/9 Scores  PHQ - 2 Score 6  PHQ- 9 Score 17   No results found for any visits on 05/27/23.  Assessment & Plan     *** Assessment and Plan              Encounter to establish care Welcomed to our clinic Reviewed past medical hx, social hx, family hx and surgical hx Pt advised to send all vaccination records or screening   No follow-ups on file.     [facility]

## 2023-05-27 ENCOUNTER — Ambulatory Visit: Payer: Medicaid Other | Admitting: Physician Assistant

## 2023-05-27 DIAGNOSIS — E1169 Type 2 diabetes mellitus with other specified complication: Secondary | ICD-10-CM

## 2023-05-27 DIAGNOSIS — Z7689 Persons encountering health services in other specified circumstances: Secondary | ICD-10-CM

## 2023-05-27 DIAGNOSIS — G629 Polyneuropathy, unspecified: Secondary | ICD-10-CM

## 2023-05-27 DIAGNOSIS — F32A Depression, unspecified: Secondary | ICD-10-CM

## 2023-05-29 ENCOUNTER — Ambulatory Visit: Payer: Medicaid Other | Admitting: Nurse Practitioner

## 2023-05-29 ENCOUNTER — Ambulatory Visit: Payer: Medicaid Other

## 2023-05-29 ENCOUNTER — Other Ambulatory Visit: Payer: Self-pay | Admitting: Nurse Practitioner

## 2023-05-29 ENCOUNTER — Ambulatory Visit
Admission: RE | Admit: 2023-05-29 | Discharge: 2023-05-29 | Disposition: A | Discharge status: Home / Self Care | Payer: Medicaid Other | Source: Ambulatory Visit | Attending: Nurse Practitioner | Admitting: Nurse Practitioner

## 2023-05-29 ENCOUNTER — Telehealth: Payer: Self-pay | Admitting: Nurse Practitioner

## 2023-05-29 VITALS — BP 134/76 | HR 100 | Temp 97.9°F | Resp 16 | Ht 66.0 in | Wt 361.4 lb

## 2023-05-29 DIAGNOSIS — R109 Unspecified abdominal pain: Secondary | ICD-10-CM | POA: Diagnosis present

## 2023-05-29 DIAGNOSIS — R1030 Lower abdominal pain, unspecified: Secondary | ICD-10-CM | POA: Diagnosis not present

## 2023-05-29 DIAGNOSIS — G8929 Other chronic pain: Secondary | ICD-10-CM

## 2023-05-29 MED ORDER — NYSTATIN 100000 UNIT/GM EX POWD: 1.0000 | Freq: Three times a day (TID) | CUTANEOUS | 1 refills | Status: DC

## 2023-05-29 MED ORDER — MELOXICAM 7.5 MG PO TABS: 7.5000 mg | ORAL_TABLET | Freq: Every day | ORAL | 0 refills | Status: DC

## 2023-05-29 MED ORDER — IOHEXOL 350 MG/ML SOLN
100.0000 mL | Freq: Once | INTRAVENOUS | Status: AC | PRN
  Administered 2023-05-29: 100 mL via INTRAVENOUS

## 2023-05-29 NOTE — Assessment & Plan Note (Addendum)
Tenderness to the left quadrant. Need further imaging differential diverticulitis, kidney stone. CT of the abdomen ordered.  Referral sent to GI.

## 2023-05-29 NOTE — Progress Notes (Signed)
Patient's transportation unable to take her to the hospital . [employer] transportation will only take her back home.

## 2023-05-29 NOTE — Telephone Encounter (Signed)
Tracy Woods from [facility] called stating the pt depression screen was 16 and they are looking to try to link her to a mental health facility

## 2023-05-29 NOTE — Progress Notes (Addendum)
Established Patient Office Visit  Subjective:  Patient ID: Tracy Woods, female    DOB: 1970-11-23  Age: 52 y.o. MRN: [id number]  CC:  Chief Complaint  Patient presents with   Abdominal Pain    HPI  Tracy Woods presents for pain in the back and abdomen.She states that the left sided abdominal pain started 2 weeks ago but more severe in last few days. She also complaint of diarrhea. She complaint the pain is pulsating. She complaint of being bloated when she eat. The pain is 6/10.  She also complaint of generalized pain sometimes in the legs and in the back. Would like to see a pain clinic.   HPI   Past Medical History:  Diagnosis Date   Diabetes mellitus without complication (HCC)    Hypertension     Past Surgical History:  Procedure Laterality Date   INCISION AND DRAINAGE OF WOUND Bilateral 09/15/2022   Procedure: IRRIGATION AND DEBRIDEMENT WOUND;  Surgeon: Carolan Shiver, MD;  Location: [facility] ORS;  Service: General;  Laterality: Bilateral;    Family History  Problem Relation Age of Onset   Stroke Mother    Miscarriages / [place] Mother    Hyperlipidemia Mother    Hypertension Mother    Heart disease Mother    Hearing loss Mother    Diabetes Mother     Social History   Socioeconomic History   Marital status: Single    Spouse name: Not on file   Number of children: Not on file   Years of education: Not on file   Highest education level: GED or equivalent  Occupational History   Not on file  Tobacco Use   Smoking status: Former    Types: Cigarettes   Smokeless tobacco: Never  Substance and Sexual Activity   Alcohol use: Not Currently   Drug use: Not Currently   Sexual activity: Not on file  Other Topics Concern   Not on file  Social History Narrative   Not on file   Social Determinants of Health   Financial Resource Strain: High Risk (05/28/2023)   Overall Financial Resource Strain (CARDIA)    Difficulty of Paying Living  Expenses: Very hard  Food Insecurity: Food Insecurity Present (05/28/2023)   Hunger Vital Sign    Worried About Running Out of Food in the Last Year: Often true    Ran Out of Food in the Last Year: Often true  Transportation Needs: Unmet Transportation Needs (05/28/2023)   PRAPARE - Administrator, Civil Service (Medical): Yes    Lack of Transportation (Non-Medical): Yes  Physical Activity: Unknown (05/28/2023)   Exercise Vital Sign    Days of Exercise per Week: 0 days    Minutes of Exercise per Session: Not on file  Stress: Stress Concern Present (05/28/2023)   [employer] of Occupational Health - Occupational Stress Questionnaire    Feeling of Stress : Very much  Social Connections: Socially Isolated (05/28/2023)   Social Connection and Isolation Panel [NHANES]    Frequency of Communication with Friends and Family: Never    Frequency of Social Gatherings with Friends and Family: Never    Attends Religious Services: Never    Database administrator or Organizations: No    Attends Engineer, structural: Not on file    Marital Status: Never married  Intimate Partner Violence: Not on file     Outpatient Medications Prior to Visit  Medication Sig Dispense Refill   Chlorhexidine Gluconate Cloth 2 %  PADS Apply 6 each topically daily.     dextrose 50 % solution Inject 0-50 mLs into the vein as needed for low blood sugar. 50 mL    Dextrose in Lactated Ringers (DEXTROSE 5% LACTATED RINGERS) 5 % infusion Inject 125 mL/hr into the vein continuous. 1000 mL    docusate (COLACE) 50 MG/5ML liquid Place 10 mLs (100 mg total) into feeding tube 2 (two) times daily. 100 mL 0   docusate sodium (COLACE) 100 MG capsule Take 1 capsule (100 mg total) by mouth 2 (two) times daily as needed for mild constipation. 10 capsule 0   doxycycline (VIBRAMYCIN) 100 MG capsule Take 1 capsule (100 mg total) by mouth 2 (two) times daily. 20 capsule 0   fentaNYL 10 mcg/ml SOLN infusion Inject 50-200  mcg/hr into the vein continuous.  0   gabapentin (NEURONTIN) 300 MG capsule Take 1 capsule (300 mg total) by mouth 3 (three) times daily. 90 capsule 3   hydrOXYzine (VISTARIL) 50 MG capsule Take 1 capsule (50 mg total) by mouth 3 (three) times daily as needed. 90 capsule 0   lactated ringers infusion Inject 125 mLs into the vein continuous. 250 mL 0   meloxicam (MOBIC) 7.5 MG tablet Take 1 tablet (7.5 mg total) by mouth daily. 30 tablet 0   metFORMIN (GLUCOPHAGE) 500 MG tablet Take 1 tablet (500 mg total) by mouth 2 (two) times daily with a meal. 180 tablet 3   Mouthwashes (MOUTH RINSE) LIQD solution 15 mLs by Mouth Rinse route every 2 (two) hours.  0   Mouthwashes (MOUTH RINSE) LIQD solution 15 mLs by Mouth Rinse route as needed (oral care).  0   norepinephrine (LEVOPHED) 4-5 MG/250ML-% SOLN Inject 0-40 mcg/min into the vein continuous.     nystatin (MYCOSTATIN/NYSTOP) powder Apply 1 Application topically 3 (three) times daily. 60 g 1   propofol (DIPRIVAN) 1000 MG/100ML EMUL injection Inject [phone] mcg/min into the vein continuous.     sodium chloride 0.9 % infusion Inject 10 mLs into the vein as needed (for administration of IV medications (carrier fluid)).  0   sodium chloride 0.9 % infusion Inject 250 mLs into the vein continuous.  0   fluconazole (DIFLUCAN) 150 MG tablet Take 1 tablet (150 mg total) by mouth once a week. 3 tablet 0   insulin regular, human (MYXREDLIN) 100 units/ 100 mL infusion Inject 1 Units/hr into the vein continuous.     polyethylene glycol (MIRALAX / GLYCOLAX) 17 g packet Take 17 g by mouth daily as needed for moderate constipation. 14 each 0   polyethylene glycol (MIRALAX / GLYCOLAX) 17 g packet Place 17 g into feeding tube daily. 14 each 0   No facility-administered medications prior to visit.    Allergies  Allergen Reactions   Amoxicillin Rash    Tolerated Zosyn 09/16/22 with no apparent issue    ROS Review of Systems Negative unless indicated in HPI.     Objective:    Physical Exam Constitutional:      Appearance: She is well-developed.  HENT:     Head: Normocephalic.  Cardiovascular:     Rate and Rhythm: Regular rhythm.  Pulmonary:     Effort: Pulmonary effort is normal.     Breath sounds: Normal breath sounds. No stridor. No wheezing.  Abdominal:     General: Abdomen is protuberant. Bowel sounds are normal.     Tenderness: There is abdominal tenderness in the left upper quadrant and left lower quadrant.  Skin:  General: Skin is warm.  Neurological:     General: No focal deficit present.     Mental Status: She is alert. She is disoriented.  Psychiatric:        Mood and Affect: Mood normal.        Behavior: Behavior normal.     BP 134/76   Pulse 100   Temp 97.9 F (36.6 C)   Resp 16   Ht 5\' 6"  (1.676 m)   Wt (!) 361 lb 6.4 oz (163.9 kg)   SpO2 97%   BMI 58.33 kg/m  Wt Readings from Last 3 Encounters:  05/29/23 (!) 361 lb 6.4 oz (163.9 kg)  04/29/23 (!) 354 lb 9.6 oz (160.8 kg)  04/03/23 (!) 352 lb 1.2 oz (159.7 kg)     Health Maintenance  Topic Date Due   FOOT EXAM  Never done   OPHTHALMOLOGY EXAM  Never done   Diabetic kidney evaluation - Urine ACR  Never done   Hepatitis C Screening  Never done   DTaP/Tdap/Td (1 - Tdap) Never done   PAP SMEAR-Modifier  Never done   Colonoscopy  Never done   MAMMOGRAM  Never done   Zoster Vaccines- Shingrix (1 of 2) Never done   COVID-19 Vaccine (1 - 2023-24 season) Never done   INFLUENZA VACCINE  06/12/2023   HEMOGLOBIN A1C  10/29/2023   Diabetic kidney evaluation - eGFR measurement  05/01/2024   HIV Screening  Completed   HPV VACCINES  Aged Out    There are no preventive care reminders to display for this patient.  Lab Results  Component Value Date   TSH 3.44 04/29/2023   Lab Results  Component Value Date   WBC 7.5 05/02/2023   HGB 13.5 05/02/2023   HCT 41.8 05/02/2023   MCV 84.6 05/02/2023   PLT 306 05/02/2023   Lab Results  Component Value Date    NA 135 05/02/2023   K 4.1 05/02/2023   CO2 20 (L) 05/02/2023   GLUCOSE 155 (H) 05/02/2023   BUN 17 05/02/2023   CREATININE 0.62 05/02/2023   BILITOT 0.7 04/03/2023   ALKPHOS 103 04/03/2023   AST 25 04/03/2023   ALT 21 04/03/2023   PROT 7.9 04/03/2023   ALBUMIN 3.2 (L) 04/03/2023   CALCIUM 8.9 05/02/2023   ANIONGAP 11 05/02/2023   Lab Results  Component Value Date   CHOL 242 (H) 04/29/2023   Lab Results  Component Value Date   HDL 55.50 04/29/2023   Lab Results  Component Value Date   LDLCALC 159 (H) 04/29/2023   Lab Results  Component Value Date   TRIG 141.0 04/29/2023   Lab Results  Component Value Date   CHOLHDL 4 04/29/2023   Lab Results  Component Value Date   HGBA1C 7.6 (H) 04/29/2023      Assessment & Plan:  There are no diagnoses linked to this encounter.  Follow-up: No follow-ups on file.   Kara Dies, NP

## 2023-05-30 ENCOUNTER — Encounter: Payer: Self-pay | Admitting: Nurse Practitioner

## 2023-05-30 ENCOUNTER — Other Ambulatory Visit: Payer: Medicaid Other

## 2023-05-30 DIAGNOSIS — G8929 Other chronic pain: Secondary | ICD-10-CM

## 2023-05-30 HISTORY — DX: Other chronic pain: G89.29

## 2023-05-30 NOTE — Telephone Encounter (Signed)
LVM to call back to  inform pt that referral to GI and Pain clinic has been placed

## 2023-05-30 NOTE — Telephone Encounter (Signed)
Please inform the patient the referral has been sen to GI and pain clinic.

## 2023-05-30 NOTE — Addendum Note (Signed)
Addended by: Kara Dies on: 05/30/2023 03:26 PM   Modules accepted: Orders

## 2023-05-30 NOTE — Assessment & Plan Note (Signed)
Referral sent to pain management.

## 2023-06-03 NOTE — Progress Notes (Signed)
Please advice the patient to take OTC charcoal tablet and avoid       drinking through a straw or chewing gum.

## 2023-06-04 NOTE — Telephone Encounter (Signed)
Pt is aware.  

## 2023-06-05 ENCOUNTER — Telehealth: Payer: Self-pay | Admitting: Nurse Practitioner

## 2023-06-05 ENCOUNTER — Other Ambulatory Visit: Payer: Self-pay | Admitting: Nurse Practitioner

## 2023-06-05 MED ORDER — HYDROXYZINE PAMOATE 50 MG PO CAPS: 50.0000 mg | ORAL_CAPSULE | Freq: Three times a day (TID) | ORAL | 0 refills | Status: DC | PRN

## 2023-06-05 NOTE — Telephone Encounter (Signed)
Prescription Request  06/05/2023  LOV: 05/29/2023  What is the name of the medication or equipment? hydrOXYzine (VISTARIL) 50 MG capsule    Have you contacted your pharmacy to request a refill? Yes   Which pharmacy would you like this sent to?  [facility] - [place], [place] - [address] [place] [address] Phone: [phone] Fax: [phone]    Patient notified that their request is being sent to the clinical staff for review and that they should receive a response within 2 business days.   Please advise at Mobile [phone] (mobile)

## 2023-06-05 NOTE — Telephone Encounter (Signed)
Medication has been sent.  

## 2023-06-06 NOTE — Telephone Encounter (Signed)
Called Patient to let her know the medications have been sent to the Pharmacy.

## 2023-06-06 NOTE — Telephone Encounter (Signed)
Pt was scheduled with [place] [facility] transportation to take to hospital and to take her home

## 2023-06-09 ENCOUNTER — Encounter: Payer: Self-pay | Admitting: *Deleted

## 2023-06-13 ENCOUNTER — Ambulatory Visit: Payer: Medicaid Other | Admitting: Nurse Practitioner

## 2023-06-21 NOTE — Progress Notes (Unsigned)
New patient visit  Patient: Tracy Woods   DOB: 1971/08/03   52 y.o. Female  MRN: [id number] Visit Date: 06/26/2023  Today's healthcare provider: Debera Lat, PA-C   No chief complaint on file.  Subjective    Tracy Woods is a 52 y.o. female who presents today as a new patient to establish care.  HPI  *** Discussed the use of AI scribe software for clinical note transcription with the patient, who gave verbal consent to proceed.  History of Present Illness            Past Medical History:  Diagnosis Date   Diabetes mellitus without complication (HCC)    Hypertension    Past Surgical History:  Procedure Laterality Date   INCISION AND DRAINAGE OF WOUND Bilateral 09/15/2022   Procedure: IRRIGATION AND DEBRIDEMENT WOUND;  Surgeon: Carolan Shiver, MD;  Location: [facility] ORS;  Service: General;  Laterality: Bilateral;   Family Status  Relation Name Status   Mother  (Not Specified)  No partnership data on file   Family History  Problem Relation Age of Onset   Stroke Mother    Miscarriages / [place] Mother    Hyperlipidemia Mother    Hypertension Mother    Heart disease Mother    Hearing loss Mother    Diabetes Mother    Social History   Socioeconomic History   Marital status: Single    Spouse name: Not on file   Number of children: Not on file   Years of education: Not on file   Highest education level: GED or equivalent  Occupational History   Not on file  Tobacco Use   Smoking status: Former    Types: Cigarettes   Smokeless tobacco: Never  Substance and Sexual Activity   Alcohol use: Not Currently   Drug use: Not Currently   Sexual activity: Not on file  Other Topics Concern   Not on file  Social History Narrative   Not on file   Social Determinants of Health   Financial Resource Strain: High Risk (05/28/2023)   Overall Financial Resource Strain (CARDIA)    Difficulty of Paying Living Expenses: Very hard  Food Insecurity: Food  Insecurity Present (05/28/2023)   Hunger Vital Sign    Worried About Running Out of Food in the Last Year: Often true    Ran Out of Food in the Last Year: Often true  Transportation Needs: Unmet Transportation Needs (05/29/2023)   PRAPARE - Transportation    Lack of Transportation (Medical): Yes    Lack of Transportation (Non-Medical): Yes  Physical Activity: Unknown (05/28/2023)   Exercise Vital Sign    Days of Exercise per Week: 0 days    Minutes of Exercise per Session: Not on file  Stress: Stress Concern Present (05/28/2023)   [employer] of Occupational Health - Occupational Stress Questionnaire    Feeling of Stress : Very much  Social Connections: Socially Isolated (05/28/2023)   Social Connection and Isolation Panel [NHANES]    Frequency of Communication with Friends and Family: Never    Frequency of Social Gatherings with Friends and Family: Never    Attends Religious Services: Never    Database administrator or Organizations: No    Attends Engineer, structural: Not on file    Marital Status: Never married   Outpatient Medications Prior to Visit  Medication Sig   Chlorhexidine Gluconate Cloth 2 % PADS Apply 6 each topically daily.   dextrose 50 % solution Inject 0-50 mLs into  the vein as needed for low blood sugar.   Dextrose in Lactated Ringers (DEXTROSE 5% LACTATED RINGERS) 5 % infusion Inject 125 mL/hr into the vein continuous.   docusate (COLACE) 50 MG/5ML liquid Place 10 mLs (100 mg total) into feeding tube 2 (two) times daily.   docusate sodium (COLACE) 100 MG capsule Take 1 capsule (100 mg total) by mouth 2 (two) times daily as needed for mild constipation.   doxycycline (VIBRAMYCIN) 100 MG capsule Take 1 capsule (100 mg total) by mouth 2 (two) times daily.   fentaNYL 10 mcg/ml SOLN infusion Inject 50-200 mcg/hr into the vein continuous.   gabapentin (NEURONTIN) 300 MG capsule Take 1 capsule (300 mg total) by mouth 3 (three) times daily.   hydrOXYzine  (VISTARIL) 50 MG capsule Take 1 capsule (50 mg total) by mouth 3 (three) times daily as needed.   lactated ringers infusion Inject 125 mLs into the vein continuous.   meloxicam (MOBIC) 7.5 MG tablet Take 1 tablet (7.5 mg total) by mouth daily.   metFORMIN (GLUCOPHAGE) 500 MG tablet Take 1 tablet (500 mg total) by mouth 2 (two) times daily with a meal.   Mouthwashes (MOUTH RINSE) LIQD solution 15 mLs by Mouth Rinse route every 2 (two) hours.   Mouthwashes (MOUTH RINSE) LIQD solution 15 mLs by Mouth Rinse route as needed (oral care).   norepinephrine (LEVOPHED) 4-5 MG/250ML-% SOLN Inject 0-40 mcg/min into the vein continuous.   nystatin (MYCOSTATIN/NYSTOP) powder Apply 1 Application topically 3 (three) times daily.   propofol (DIPRIVAN) 1000 MG/100ML EMUL injection Inject [phone] mcg/min into the vein continuous.   sodium chloride 0.9 % infusion Inject 10 mLs into the vein as needed (for administration of IV medications (carrier fluid)).   sodium chloride 0.9 % infusion Inject 250 mLs into the vein continuous.   No facility-administered medications prior to visit.   Allergies  Allergen Reactions   Amoxicillin Rash    Tolerated Zosyn 09/16/22 with no apparent issue     There is no immunization history on file for this patient.  Health Maintenance  Topic Date Due   FOOT EXAM  Never done   OPHTHALMOLOGY EXAM  Never done   Diabetic kidney evaluation - Urine ACR  Never done   Hepatitis C Screening  Never done   DTaP/Tdap/Td (1 - Tdap) Never done   PAP SMEAR-Modifier  Never done   Colonoscopy  Never done   MAMMOGRAM  Never done   Zoster Vaccines- Shingrix (1 of 2) Never done   COVID-19 Vaccine (1 - 2023-24 season) Never done   INFLUENZA VACCINE  06/12/2023   HEMOGLOBIN A1C  10/29/2023   Diabetic kidney evaluation - eGFR measurement  05/01/2024   HIV Screening  Completed   HPV VACCINES  Aged Out    Patient Care Team: Kara Dies, NP as PCP - General (Nurse  Practitioner)  Review of Systems  All other systems reviewed and are negative.  Except see HPI   {Insert previous labs (optional):23779} {See past labs  Heme  Chem  Endocrine  Serology  Results Review (optional):1}   Objective    There were no vitals taken for this visit. {Insert last BP/Wt (optional):23777}{See vitals history (optional):1}   Physical Exam Vitals reviewed.  Constitutional:      General: She is not in acute distress.    Appearance: Normal appearance. She is well-developed. She is not diaphoretic.  HENT:     Head: Normocephalic and atraumatic.  Eyes:     General: No scleral icterus.  Conjunctiva/sclera: Conjunctivae normal.  Neck:     Thyroid: No thyromegaly.  Cardiovascular:     Rate and Rhythm: Normal rate and regular rhythm.     Pulses: Normal pulses.     Heart sounds: Normal heart sounds. No murmur heard. Pulmonary:     Effort: Pulmonary effort is normal. No respiratory distress.     Breath sounds: Normal breath sounds. No wheezing, rhonchi or rales.  Musculoskeletal:     Cervical back: Neck supple.     Right lower leg: No edema.     Left lower leg: No edema.  Lymphadenopathy:     Cervical: No cervical adenopathy.  Skin:    General: Skin is warm and dry.     Findings: No rash.  Neurological:     Mental Status: She is alert and oriented to person, place, and time. Mental status is at baseline.  Psychiatric:        Mood and Affect: Mood normal.        Behavior: Behavior normal.     Depression Screen    05/29/2023    1:27 PM 04/29/2023    2:00 PM  PHQ 2/9 Scores  PHQ - 2 Score 6 6  PHQ- 9 Score 19 17   No results found for any visits on 06/26/23.  Assessment & Plan     *** Encounter to establish care Welcomed to our clinic Reviewed past medical hx, social hx, family hx and surgical hx Pt advised to send all vaccination records or screening   No follow-ups on file.    The patient was advised to call back or seek an in-person  evaluation if the symptoms worsen or if the condition fails to improve as anticipated.  I discussed the assessment and treatment plan with the patient. The patient was provided an opportunity to ask questions and all were answered. The patient agreed with the plan and demonstrated an understanding of the instructions.  I, Debera Lat, PA-C have reviewed all documentation for this visit. The documentation on  06/26/23 for the exam, diagnosis, procedures, and orders are all accurate and complete.  Debera Lat, [facility], MMS [facility] [phone] (phone) [phone] (fax)  [facility]

## 2023-06-26 ENCOUNTER — Ambulatory Visit: Payer: Medicaid Other | Admitting: Physician Assistant

## 2023-06-26 ENCOUNTER — Encounter: Payer: Self-pay | Admitting: Physician Assistant

## 2023-06-26 VITALS — BP 142/93 | HR 91 | Ht 65.4 in | Wt 350.0 lb

## 2023-06-26 DIAGNOSIS — B379 Candidiasis, unspecified: Secondary | ICD-10-CM | POA: Insufficient documentation

## 2023-06-26 DIAGNOSIS — E1169 Type 2 diabetes mellitus with other specified complication: Secondary | ICD-10-CM | POA: Diagnosis not present

## 2023-06-26 DIAGNOSIS — N399 Disorder of urinary system, unspecified: Secondary | ICD-10-CM

## 2023-06-26 DIAGNOSIS — Z7689 Persons encountering health services in other specified circumstances: Secondary | ICD-10-CM

## 2023-06-26 DIAGNOSIS — E7800 Pure hypercholesterolemia, unspecified: Secondary | ICD-10-CM

## 2023-06-26 DIAGNOSIS — R1031 Right lower quadrant pain: Secondary | ICD-10-CM | POA: Diagnosis not present

## 2023-06-26 DIAGNOSIS — G629 Polyneuropathy, unspecified: Secondary | ICD-10-CM

## 2023-06-26 DIAGNOSIS — F419 Anxiety disorder, unspecified: Secondary | ICD-10-CM

## 2023-06-26 DIAGNOSIS — R030 Elevated blood-pressure reading, without diagnosis of hypertension: Secondary | ICD-10-CM

## 2023-06-26 DIAGNOSIS — G8929 Other chronic pain: Secondary | ICD-10-CM

## 2023-06-26 DIAGNOSIS — K429 Umbilical hernia without obstruction or gangrene: Secondary | ICD-10-CM

## 2023-06-26 DIAGNOSIS — F32A Depression, unspecified: Secondary | ICD-10-CM

## 2023-06-26 HISTORY — DX: Pure hypercholesterolemia, unspecified: E78.00

## 2023-06-26 HISTORY — DX: Candidiasis, unspecified: B37.9

## 2023-06-26 LAB — POCT URINALYSIS DIPSTICK
Bilirubin, UA: POSITIVE
Blood, UA: NEGATIVE
Glucose, UA: NEGATIVE
Ketones, UA: NEGATIVE
Leukocytes, UA: NEGATIVE
Nitrite, UA: NEGATIVE
Protein, UA: POSITIVE — AB
Spec Grav, UA: <=1.005 — AB (ref 1.010–1.025)
Urobilinogen, UA: 0.2 E.U./dL
pH, UA: 6 (ref 5.0–8.0)

## 2023-06-27 ENCOUNTER — Telehealth: Payer: Self-pay

## 2023-06-27 ENCOUNTER — Telehealth: Payer: Self-pay | Admitting: Physician Assistant

## 2023-06-27 LAB — LIPID PANEL
Chol/HDL Ratio: 4.6 ratio — ABNORMAL HIGH (ref 0.0–4.4)
Cholesterol, Total: 258 mg/dL — ABNORMAL HIGH (ref 100–199)
HDL: 56 mg/dL (ref >39)
LDL Chol Calc (NIH): 177 mg/dL — ABNORMAL HIGH (ref 0–99)
Triglycerides: 139 mg/dL (ref 0–149)
VLDL Cholesterol Cal: 25 mg/dL (ref 5–40)

## 2023-06-27 LAB — CBC WITH DIFFERENTIAL/PLATELET
Basophils Absolute: 0 10*3/uL (ref 0.0–0.2)
Basos: 1 %
EOS (ABSOLUTE): 0.2 10*3/uL (ref 0.0–0.4)
Eos: 3 %
Hematocrit: 42 % (ref 34.0–46.6)
Hemoglobin: 13.4 g/dL (ref 11.1–15.9)
Immature Grans (Abs): 0 10*3/uL (ref 0.0–0.1)
Immature Granulocytes: 0 %
Lymphocytes Absolute: 2.1 10*3/uL (ref 0.7–3.1)
Lymphs: 27 %
MCH: 26.7 pg (ref 26.6–33.0)
MCHC: 31.9 g/dL (ref 31.5–35.7)
MCV: 84 fL (ref 79–97)
Monocytes Absolute: 0.6 10*3/uL (ref 0.1–0.9)
Monocytes: 7 %
Neutrophils Absolute: 4.8 10*3/uL (ref 1.4–7.0)
Neutrophils: 62 %
Platelets: 281 10*3/uL (ref 150–450)
RBC: 5.02 x10E6/uL (ref 3.77–5.28)
RDW: 13.2 % (ref 11.7–15.4)
WBC: 7.8 10*3/uL (ref 3.4–10.8)

## 2023-06-27 LAB — SPECIMEN STATUS REPORT

## 2023-06-27 LAB — COMPREHENSIVE METABOLIC PANEL
ALT: 25 IU/L (ref 0–32)
AST: 35 IU/L (ref 0–40)
Albumin: 4 g/dL (ref 3.8–4.9)
Alkaline Phosphatase: 126 IU/L — ABNORMAL HIGH (ref 44–121)
BUN/Creatinine Ratio: 17 (ref 9–23)
BUN: 13 mg/dL (ref 6–24)
Bilirubin Total: 0.5 mg/dL (ref 0.0–1.2)
CO2: 21 mmol/L (ref 20–29)
Calcium: 10 mg/dL (ref 8.7–10.2)
Chloride: 104 mmol/L (ref 96–106)
Creatinine, Ser: 0.76 mg/dL (ref 0.57–1.00)
Globulin, Total: 3.8 g/dL (ref 1.5–4.5)
Glucose: 108 mg/dL — ABNORMAL HIGH (ref 70–99)
Potassium: 4.6 mmol/L (ref 3.5–5.2)
Sodium: 140 mmol/L (ref 134–144)
Total Protein: 7.8 g/dL (ref 6.0–8.5)
eGFR: 94 mL/min/{1.73_m2} (ref >59)

## 2023-06-27 LAB — TSH: TSH: 2.43 u[IU]/mL (ref 0.450–4.500)

## 2023-06-27 LAB — HEMOGLOBIN A1C
Est. average glucose Bld gHb Est-mCnc: 154 mg/dL
Hgb A1c MFr Bld: 7 % — ABNORMAL HIGH (ref 4.8–5.6)

## 2023-06-27 LAB — MICROALBUMIN / CREATININE URINE RATIO

## 2023-06-27 MED ORDER — OZEMPIC (0.25 OR 0.5 MG/DOSE) 2 MG/3ML ~~LOC~~ SOPN
PEN_INJECTOR | SUBCUTANEOUS | 1 refills | Status: DC
Start: 2023-06-27 — End: 2023-07-16

## 2023-06-27 NOTE — Addendum Note (Signed)
Addended by: Debera Lat on: 06/27/2023 12:24 PM   Modules accepted: Orders

## 2023-06-27 NOTE — Telephone Encounter (Signed)
[facility] Key: [id number] Name: [place] Ozempic 0.25 or0.5mg /dose 2mg /41ml pen injectors

## 2023-06-27 NOTE — Telephone Encounter (Signed)
Copied from CRM [id number]. Topic: General - Other >> Jun 27, 2023 10:05 AM Dondra Prader A wrote: Reason for CRM: Natalia Leatherwood with [facility] states that they received a test order for albumin creatine ration and did not receive the right specimen. They only received a urine gray. Natalia Leatherwood is wanting to know if they are only needing to do a urine culture. Please advise.

## 2023-06-30 NOTE — Telephone Encounter (Signed)
Your request has been approved  PA Case: [id number], Status: Approved, Coverage Starts on: 06/30/2023 12:00:00 AM, Coverage Ends on: 06/29/2024 12:00:00 AM.  Patient aware of approval

## 2023-07-01 ENCOUNTER — Telehealth: Payer: Self-pay | Admitting: Gastroenterology

## 2023-07-01 LAB — SPECIMEN STATUS REPORT

## 2023-07-01 LAB — URINE CULTURE

## 2023-07-01 NOTE — Telephone Encounter (Signed)
Tracy Woods, Dr. Tobi Bastos does not have any appointments until October. She can either go to see her PCP or Emergency Room. I'm sorry. I will send her a message through MyChart letting her know.

## 2023-07-01 NOTE — Telephone Encounter (Signed)
Patient called in stating that she is in so much pain. She don't want to go back to the ED and she is needing a earlier appointment

## 2023-07-02 NOTE — Telephone Encounter (Signed)
Patient stated that she is still in pain and it's causes the left size of her stomach and at the top of her belly button.

## 2023-07-03 NOTE — Telephone Encounter (Signed)
Called the patient and let her know that she was schedule for the next available with Inetta Fermo

## 2023-07-07 ENCOUNTER — Ambulatory Visit: Payer: Self-pay

## 2023-07-07 ENCOUNTER — Telehealth: Payer: Medicaid Other | Admitting: Nurse Practitioner

## 2023-07-07 DIAGNOSIS — R3989 Other symptoms and signs involving the genitourinary system: Secondary | ICD-10-CM | POA: Diagnosis not present

## 2023-07-07 MED ORDER — NITROFURANTOIN MONOHYD MACRO 100 MG PO CAPS
100.0000 mg | ORAL_CAPSULE | Freq: Two times a day (BID) | ORAL | 0 refills | Status: AC
Start: 2023-07-07 — End: 2023-07-12

## 2023-07-07 NOTE — Progress Notes (Signed)
E-Visit for Urinary Problems  We are sorry that you are not feeling well.  Here is how we plan to help!  Based on what you shared with me it looks like you most likely have a simple urinary tract infection.  A UTI (Urinary Tract Infection) is a bacterial infection of the bladder.  Most cases of urinary tract infections are simple to treat but a key part of your care is to encourage you to drink plenty of fluids and watch your symptoms carefully.  I have prescribed MacroBid 100 mg twice a day for 5 days.  Your symptoms should gradually improve. Call us if the burning in your urine worsens, you develop worsening fever, back pain or pelvic pain or if your symptoms do not resolve after completing the antibiotic.  Urinary tract infections can be prevented by drinking plenty of water to keep your body hydrated.  Also be sure when you wipe, wipe from front to back and don't hold it in!  If possible, empty your bladder every 4 hours.  HOME CARE Drink plenty of fluids Compete the full course of the antibiotics even if the symptoms resolve Remember, when you need to go.go. Holding in your urine can increase the likelihood of getting a UTI! GET HELP RIGHT AWAY IF: You cannot urinate You get a high fever Worsening back pain occurs You see blood in your urine You feel sick to your stomach or throw up You feel like you are going to pass out  MAKE SURE YOU  Understand these instructions. Will watch your condition. Will get help right away if you are not doing well or get worse.   Thank you for choosing an e-visit.  Your e-visit answers were reviewed by a board certified advanced clinical practitioner to complete your personal care plan. Depending upon the condition, your plan could have included both over the counter or prescription medications.  Please review your pharmacy choice. Make sure the pharmacy is open so you can pick up prescription now. If there is a problem, you may contact your  provider through MyChart messaging and have the prescription routed to another pharmacy.  Your safety is important to us. If you have drug allergies check your prescription carefully.   For the next 24 hours you can use MyChart to ask questions about today's visit, request a non-urgent call back, or ask for a work or school excuse. You will get an email in the next two days asking about your experience. I hope that your e-visit has been valuable and will speed your recovery.   Meds ordered this encounter  Medications   nitrofurantoin, macrocrystal-monohydrate, (MACROBID) 100 MG capsule    Sig: Take 1 capsule (100 mg total) by mouth 2 (two) times daily for 5 days.    Dispense:  10 capsule    Refill:  0     I spent approximately 5 minutes reviewing the patient's history, current symptoms and coordinating their care today.   

## 2023-07-07 NOTE — Telephone Encounter (Signed)
Summary: Pt requests Rx for an antibiotic for possible uti.   Pt requests Rx for an antibiotic for possible uti. Pt stated she has been on antibiotics at least 3 times this year. Cb# [phone]         Chief Complaint: Urinary burning, pain, low to empty bladder.Refuses OV."I have to have 2 days notice for transportation. Offered to set up [facility] , declines. "I need antibiotic called in." Instructed she would need OV. Symptoms: Above Frequency: Yesterday Pertinent Negatives: Patient denies fever Disposition: [] ED /[] Urgent Care (no appt availability in office) / [] Appointment(In office/virtual)/ []  [place] [facility]/ [] Home Care/ [x] Refused Recommended Disposition /[] [place] [facility]/ []  Follow-up with PCP Additional Notes: Please advise pt.  Reason for Disposition  Urinating more frequently than usual (i.e., frequency)  Answer Assessment - Initial Assessment Questions 1. SYMPTOM: "What's the main symptom you're concerned about?" (e.g., frequency, incontinence)     Burning 2. ONSET: "When did the    start?"     Yesterday 3. PAIN: "Is there any pain?" If Yes, ask: "How bad is it?" (Scale: 1-10; mild, moderate, severe)     Yes 4. CAUSE: "What do you think is causing the symptoms?"     UTI 5. OTHER SYMPTOMS: "Do you have any other symptoms?" (e.g., blood in urine, fever, flank pain, pain with urination)     Difficulty emptying, abdominal  6. PREGNANCY: "Is there any chance you are pregnant?" "When was your last menstrual period?"     No  Protocols used: Urinary Symptoms-A-AH

## 2023-07-08 ENCOUNTER — Telehealth: Payer: Medicaid Other | Admitting: Dietician

## 2023-07-11 ENCOUNTER — Other Ambulatory Visit: Payer: Self-pay | Admitting: Physician Assistant

## 2023-07-11 ENCOUNTER — Telehealth: Payer: Self-pay | Admitting: Physician Assistant

## 2023-07-11 DIAGNOSIS — B379 Candidiasis, unspecified: Secondary | ICD-10-CM

## 2023-07-11 NOTE — Telephone Encounter (Signed)
Medication Refill - Medication: hydrOXYzine (VISTARIL) 50 MG capsule , nystatin (MYCOSTATIN/NYSTOP) powder   Has the patient contacted their pharmacy? Yes.     Preferred Pharmacy (with phone number or street name):  [facility] - [place], [place] - [address] Phone: [phone]  Fax: [phone]      Has the patient been seen for an appointment in the last year OR does the patient have an upcoming appointment? Yes.    The patient states she is completely out of the powder and she didn't realize it takes a few days to respond to or that Monday was a holiday. She will be out of the hydroxyzine on Monday. Please assist patient further

## 2023-07-11 NOTE — Telephone Encounter (Signed)
Patient just called and needs a few refills. The first nystatin (MYCOSTATIN/NYSTOP) powder and hydrOXYzine (VISTARIL) 50 MG capsule. The pharmacy she uses is [facility] - [place], [place] - [address] [place], [place] [address] Phone: [phone]  Fax: [phone] DEA #: --  Her number [phone].

## 2023-07-11 NOTE — Telephone Encounter (Signed)
Requested medication (s) are due for refill today - yes  Requested medication (s) are on the active medication list -yes  Future visit scheduled -yes  Last refill: hydroxyzine- 06/05/23 #90                  Nystatin- 05/29/23 60g 1RF  Notes to clinic: outside provider- patient request PCP fill  Requested Prescriptions  Pending Prescriptions Disp Refills   hydrOXYzine (VISTARIL) 50 MG capsule 90 capsule 0    Sig: Take 1 capsule (50 mg total) by mouth 3 (three) times daily as needed.     Ear, Nose, and Throat:  Antihistamines 2 Passed - 07/11/2023  2:03 PM      Passed - Cr in normal range and within 360 days    Creatinine, Ser  Date Value Ref Range Status  06/26/2023 0.76 0.57 - 1.00 mg/dL Final         Passed - Valid encounter within last 12 months    Recent Outpatient Visits           2 weeks ago Type 2 diabetes mellitus with other specified complication, unspecified whether long term insulin use (HCC)   [place] [facility] [place], [place], PA-C       Future Appointments             In 2 weeks Ostwalt, [place], PA-C [place] [facility]             nystatin (MYCOSTATIN/NYSTOP) powder 60 g 1    Sig: Apply 1 Application topically 3 (three) times daily.     Off-Protocol Failed - 07/11/2023  2:03 PM      Failed - Medication not assigned to a protocol, review manually.      Passed - Valid encounter within last 12 months    Recent Outpatient Visits           2 weeks ago Type 2 diabetes mellitus with other specified complication, unspecified whether long term insulin use (HCC)   [place] [facility] [place], [place], PA-C       Future Appointments             In 2 weeks Ostwalt, [place], PA-C [place] [facility], [facility]               Requested Prescriptions  Pending Prescriptions Disp Refills   hydrOXYzine (VISTARIL) 50 MG capsule 90 capsule 0    Sig: Take 1 capsule (50 mg total) by  mouth 3 (three) times daily as needed.     Ear, Nose, and Throat:  Antihistamines 2 Passed - 07/11/2023  2:03 PM      Passed - Cr in normal range and within 360 days    Creatinine, Ser  Date Value Ref Range Status  06/26/2023 0.76 0.57 - 1.00 mg/dL Final         Passed - Valid encounter within last 12 months    Recent Outpatient Visits           2 weeks ago Type 2 diabetes mellitus with other specified complication, unspecified whether long term insulin use (HCC)   [facility] [place], [place], PA-C       Future Appointments             In 2 weeks Debera Lat, PA-C [place] [facility]             nystatin (MYCOSTATIN/NYSTOP) powder 60 g 1  Sig: Apply 1 Application topically 3 (three) times daily.     Off-Protocol Failed - 07/11/2023  2:03 PM      Failed - Medication not assigned to a protocol, review manually.      Passed - Valid encounter within last 12 months    Recent Outpatient Visits           2 weeks ago Type 2 diabetes mellitus with other specified complication, unspecified whether long term insulin use (HCC)   [place] [facility] [place], [place], PA-C       Future Appointments             In 2 weeks Debera Lat, PA-C [facility], [facility]

## 2023-07-15 ENCOUNTER — Encounter: Payer: Self-pay | Admitting: Physician Assistant

## 2023-07-15 MED ORDER — NYSTATIN 100000 UNIT/GM EX POWD
1.0000 | Freq: Three times a day (TID) | CUTANEOUS | 1 refills | Status: DC
Start: 2023-07-15 — End: 2023-07-16

## 2023-07-15 MED ORDER — HYDROXYZINE PAMOATE 50 MG PO CAPS: 50.0000 mg | ORAL_CAPSULE | Freq: Three times a day (TID) | ORAL | 0 refills | Status: DC | PRN

## 2023-07-16 ENCOUNTER — Encounter: Payer: Self-pay | Admitting: Physician Assistant

## 2023-07-16 ENCOUNTER — Ambulatory Visit: Payer: Medicaid Other | Admitting: Physician Assistant

## 2023-07-16 ENCOUNTER — Telehealth: Payer: Self-pay | Admitting: Physician Assistant

## 2023-07-16 VITALS — BP 158/93 | HR 90 | Temp 98.1°F | Ht 64.0 in | Wt 346.8 lb

## 2023-07-16 DIAGNOSIS — R1032 Left lower quadrant pain: Secondary | ICD-10-CM | POA: Diagnosis not present

## 2023-07-16 DIAGNOSIS — Z1211 Encounter for screening for malignant neoplasm of colon: Secondary | ICD-10-CM

## 2023-07-16 DIAGNOSIS — K219 Gastro-esophageal reflux disease without esophagitis: Secondary | ICD-10-CM

## 2023-07-16 DIAGNOSIS — R1013 Epigastric pain: Secondary | ICD-10-CM

## 2023-07-16 DIAGNOSIS — K439 Ventral hernia without obstruction or gangrene: Secondary | ICD-10-CM

## 2023-07-16 MED ORDER — PANTOPRAZOLE SODIUM 40 MG PO TBEC
40.0000 mg | DELAYED_RELEASE_TABLET | Freq: Every day | ORAL | 2 refills | Status: DC
Start: 2023-07-16 — End: 2023-10-31

## 2023-07-16 MED ORDER — PEG 3350-KCL-NA BICARB-NACL 420 G PO SOLR
8000.0000 mL | Freq: Once | ORAL | 0 refills | Status: AC
Start: 2023-07-16 — End: 2023-07-16

## 2023-07-16 NOTE — Progress Notes (Signed)
Celso Amy, PA-C [address]  [address]  [place], [place] [address]  Main: [phone]  Fax: [phone]   Gastroenterology Consultation  Referring Provider:     Kara Dies, NP Primary Care Physician:  Debera Lat, PA-C Primary Gastroenterologist:  Celso Amy, PA-C / Dr. Wyline Mood   Reason for Consultation:     LLQ abdominal Pain        HPI:   Tracy Woods is a 52 y.o. y/o female referred for consultation & management  by Debera Lat, PA-C.    Patient is here to evaluate LLQ abdominal pain.  She has history of IBS, chronic abdominal pain, and irregular bowel habits with diarrhea alternating with constipation.  Patient states she has had worsening LLQ pain for 2 months.  Review of her chart shows she has had LLQ pain since at least February 2024.  Has history of left lower quadrant abdominal hernia for 15 years which has been enlarging.  Patient refused surgical evaluation.  She feels like she is high risk for surgery given her morbid obesity (BMI 59).  She would like to have a colonoscopy and an upper endoscopy.  She denies rectal bleeding.  She is age 72 and has never had a screening colonoscopy.  She reports having an EGD in 2011.  She was diagnosed with gastritis after a bout of heavy alcohol use.  She reports episodes of epigastric pain which comes and goes.  Has a lot of belching.  Was on omeprazole and discontinued.  Not currently on PPI.  Has predominant constipation.  No current treatment.  Abdominal pelvic CT with contrast 05/29/2023 (to evaluate LLQ pain) showed a large ventral abdominal wall hernia containing fat and small bowel with no obstruction.  Similar to previous CT scan done 01/2023.  Aortic atherosclerosis.  No other acute abnormality.  No diverticulitis.  There was stool throughout the colon.  No bowel inflammation.  She has been referred to general surgeon to evaluate for hernia repair.  Labs 06/26/2023 showed normal CBC and CMP except  slightly elevated alkaline phosphatase 126.  Normal hemoglobin 13.4.  Urine culture negative for UTI.  Medical history significant for morbid obesity, uncontrolled diabetes, depression, anxiety, chronic pain, neuropathy, history of IBS, history of recurrent UTIs.   Past Medical History:  Diagnosis Date   Anxiety and depression 05/10/2023   Candidiasis 06/26/2023   Diabetes mellitus without complication (HCC)    Elevated cholesterol 06/26/2023   History of urinary tract infection 11/18/2022   Hypertension    Morbid obesity (HCC) 05/10/2023   Necrotizing fasciitis (HCC) 10/24/2022   Neuropathy 05/10/2023   Other chronic pain 05/30/2023    Past Surgical History:  Procedure Laterality Date   INCISION AND DRAINAGE OF WOUND Bilateral 09/15/2022   Procedure: IRRIGATION AND DEBRIDEMENT WOUND;  Surgeon: Carolan Shiver, MD;  Location: [facility] ORS;  Service: General;  Laterality: Bilateral;    Prior to Admission medications   Medication Sig Start Date End Date Taking? Authorizing Provider  Chlorhexidine Gluconate Cloth 2 % PADS Apply 6 each topically daily. 09/16/22   Harlon Ditty D, NP  dextrose 50 % solution Inject 0-50 mLs into the vein as needed for low blood sugar. 09/16/22   Judithe Modest, NP  Dextrose in Lactated Ringers (DEXTROSE 5% LACTATED RINGERS) 5 % infusion Inject 125 mL/hr into the vein continuous. 09/16/22   Judithe Modest, NP  docusate (COLACE) 50 MG/5ML liquid Place 10 mLs (100 mg total) into feeding tube 2 (two) times daily.  09/16/22   Judithe Modest, NP  docusate sodium (COLACE) 100 MG capsule Take 1 capsule (100 mg total) by mouth 2 (two) times daily as needed for mild constipation. 09/16/22   Judithe Modest, NP  doxycycline (VIBRAMYCIN) 100 MG capsule Take 1 capsule (100 mg total) by mouth 2 (two) times daily. 05/02/23   Triplett, Rulon Eisenmenger B, FNP  fentaNYL 10 mcg/ml SOLN infusion Inject 50-200 mcg/hr into the vein continuous. 09/16/22   Judithe Modest, NP   gabapentin (NEURONTIN) 300 MG capsule Take 1 capsule (300 mg total) by mouth 3 (three) times daily. 04/29/23   Kara Dies, NP  hydrOXYzine (VISTARIL) 50 MG capsule Take 1 capsule (50 mg total) by mouth 3 (three) times daily as needed. 07/15/23   Ostwalt, Edmon Crape, PA-C  lactated ringers infusion Inject 125 mLs into the vein continuous. 09/16/22   Judithe Modest, NP  meloxicam (MOBIC) 7.5 MG tablet Take 1 tablet (7.5 mg total) by mouth daily. 05/29/23   Kara Dies, NP  metFORMIN (GLUCOPHAGE) 500 MG tablet Take 1 tablet (500 mg total) by mouth 2 (two) times daily with a meal. 05/01/23   Kara Dies, NP  Mouthwashes (MOUTH RINSE) LIQD solution 15 mLs by Mouth Rinse route every 2 (two) hours. 09/16/22   Judithe Modest, NP  Mouthwashes (MOUTH RINSE) LIQD solution 15 mLs by Mouth Rinse route as needed (oral care). 09/16/22   Harlon Ditty D, NP  norepinephrine (LEVOPHED) 4-5 MG/250ML-% SOLN Inject 0-40 mcg/min into the vein continuous. 09/16/22   Harlon Ditty D, NP  nystatin (MYCOSTATIN/NYSTOP) powder Apply 1 Application topically 3 (three) times daily. 07/15/23   Debera Lat, PA-C  propofol (DIPRIVAN) 1000 MG/100ML EMUL injection Inject [phone] mcg/min into the vein continuous. 09/16/22   Judithe Modest, NP  Semaglutide,0.25 or 0.5MG /DOS, (OZEMPIC, 0.25 OR 0.5 MG/DOSE,) 2 MG/3ML SOPN Initial, 0.25 mg subQ once weekly for 4 weeks, then increase to 0.5 mg once weekly; may increase to MAX 1 mg once weekly if additional glycemic control is needed after at least 4 weeks of the 0.5-mg weekly dosage. 06/27/23   Debera Lat, PA-C  sodium chloride 0.9 % infusion Inject 10 mLs into the vein as needed (for administration of IV medications (carrier fluid)). 09/16/22   Harlon Ditty D, NP  sodium chloride 0.9 % infusion Inject 250 mLs into the vein continuous. 09/16/22   Judithe Modest, NP    Family History  Problem Relation Age of Onset   Stroke Mother    Miscarriages / [place]  Mother    Hyperlipidemia Mother    Hypertension Mother    Heart disease Mother    Hearing loss Mother    Diabetes Mother      Social History   Tobacco Use   Smoking status: Former    Types: Cigarettes   Smokeless tobacco: Never  Vaping Use   Vaping status: Never Used  Substance Use Topics   Alcohol use: Not Currently   Drug use: Not Currently    Allergies as of 07/16/2023 - Review Complete 07/16/2023  Allergen Reaction Noted   Amoxicillin Rash 09/15/2022    Review of Systems:    All systems reviewed and negative except where noted in HPI.   Physical Exam:  BP (!) 158/93   Pulse 90   Temp 98.1 F (36.7 C)   Ht 5\' 4"  (1.626 m)   Wt (!) 346 lb 12.8 oz (157.3 kg)   BMI 59.53 kg/m  No LMP recorded. Patient is postmenopausal.  General:   Alert, morbidly obese, chronically ill appearing deconditioned female, pleasant and cooperative in NAD.  Walks very slowly with a walker.  Cannot lay supine on the exam table. Lungs:  Respirations even and unlabored.  Clear throughout to auscultation.   No wheezes, crackles, or rhonchi. No acute distress. Heart:  Regular rate and rhythm; no murmurs, clicks, rubs, or gallops. Abdomen:  Normal bowel sounds.  No bruits.  Soft, and severely obese abdomen.  Very large abdominal wall hernia in the left lower abdomen which is tender to light and deep palpation.  Large umbilical hernia.  No skin discoloration.  No guarding or rebound tenderness.    Skin: Moderate candidiasis erythematous rash in the skin folds of the abdomen. Psych:  Alert and cooperative.  Depressed mood and affect.  Imaging Studies: See HPI.  Assessment and Plan:   Tracy Woods is a 52 y.o. y/o female has been referred for:  LLQ Abdominal Pain: Suspect this is due to her very large ventral hernia in the left lower abdomen.  Recent CT scan showed no evidence of diverticulitis or bowel obstruction.  Constipation may also be contributing to her abdominal pain.   Large  Abdominal Hernia extending down in the LLQ / Left Groin  I encouraged her to follow-up with general surgeon for a surgical opinion.  GERD / Belching / Dyspepsia  H. Pylori Breath Test  Scheduling EGD I discussed risks of EGD with patient to include risk of bleeding, perforation, and risk of sedation.  Patient expressed understanding and agrees to proceed with EGD.   Start pantoprazole 40 Mg 1 tablet once daily.  Constipation I Gave samples of Linzess 72 mcg QD for 1 week, then 145 mcg QD for 1 week, then 290 mcg QD for 1 week.  She will let me know which dose works best, and then she can call me back for a prescription.   Colon Cancer Screening  Scheduling Colonoscopy I discussed risks of colonoscopy with patient to include risk of bleeding, colon perforation, and risk of sedation.  Patient expressed understanding and agrees to proceed with colonoscopy.   Morbid Obesity  Encouraged weight loss.  Follow up 4 - 6 weeks after procedures with Dr. Tobi Bastos.  Celso Amy, PA-C

## 2023-07-16 NOTE — Telephone Encounter (Signed)
Tracy Woods called in from the pharmacy she needs the correct dosage for the medication.

## 2023-07-17 LAB — H. PYLORI BREATH TEST: H pylori Breath Test: NEGATIVE

## 2023-07-18 ENCOUNTER — Ambulatory Visit: Payer: Self-pay

## 2023-07-18 ENCOUNTER — Telehealth: Payer: Self-pay | Admitting: Physician Assistant

## 2023-07-18 NOTE — Telephone Encounter (Signed)
Referral Request - Has patient seen PCP for this complaint? No  *If NO, is insurance requiring patient see PCP for this issue before PCP can refer them? Patient doesn't know   Referral for which specialty: Foot Specialist   Preferred provider/office: She has never gone to one & just wants a referral to one since she has insurance this year   Reason for referral: Big toes discoloration , wants her feet checked by a specialist since she is a diabetic

## 2023-07-18 NOTE — Telephone Encounter (Signed)
Chief Complaint: Pain to great toes Symptoms: long toenails, redness/discoloration to the outside of both great toes, pain 3-6/10 Frequency: Over a month Pertinent Negatives: Patient denies other symptoms Disposition: [] ED /[] Urgent Care (no appt availability in office) / [x] Appointment(In office/virtual)/ []  [facility]/ [] Home Care/ [x] Refused Recommended Disposition /[] [place] [facility]/ []  Follow-up with PCP Additional Notes: Patient says her toenails on the great toes are long and they are causing pain to the toes, painful to touch. She says she needs a referral to a podiatrist. Advised due to the discoloration/redness, she will need to be evaluated next week. She says she will need 2 days to arrange transportation. Offered Tuesday or Wednesday. She says she will just wait until the already scheduled visit on 07/28/23. Advised I will send this to [facility] for review and if she wants a sooner appointment scheduled, someone will call her back. Patient verbalized understanding.     Summary: discomfort & discoloration in big toes    Patient has some discomfort and discoloration in her big toes, painful when she hits them or if they are touched.  Patient states she is a diabetic but not on diabetic medication. Patient has not seen PCP for this, has a visit on 07/28/2023 but wants a referral to a specialist.  Please advise & contact patient back @ #[phone]   Patient insisted I send a referral request to PCP so I did.     Reason for Disposition  [1] MODERATE pain (e.g., interferes with normal activities, limping) AND [2] present > 3 days  Answer Assessment - Initial Assessment Questions 1. ONSET: "When did the pain start?"      Painful for months 2. LOCATION: "Where is the pain located?"   (e.g., around nail, entire toe, at foot joint)      Both great toes 3. PAIN: "How bad is the pain?"    (Scale 1-10; or mild, moderate, severe)   -  MILD (1-3): doesn't interfere with  normal activities    -  MODERATE (4-7): interferes with normal activities (e.g., work or school) or awakens from sleep, limping    -  SEVERE (8-10): excruciating pain, unable to do any normal activities, unable to walk     3-4 normal day without anything touching 4. APPEARANCE: "What does the toe look like?" (e.g., redness, swelling, bruising, pallor)     Redness, discoloration 5. CAUSE: "What do you think is causing the toe pain?"     Long toenails 6. OTHER SYMPTOMS: "Do you have any other symptoms?" (e.g., leg pain, rash, fever, numbness)     Some numbness to the toes  Protocols used: Toe Pain-A-AH

## 2023-07-21 ENCOUNTER — Encounter: Payer: Self-pay | Admitting: Physician Assistant

## 2023-07-22 ENCOUNTER — Ambulatory Visit: Payer: Medicaid Other | Admitting: Physician Assistant

## 2023-07-28 ENCOUNTER — Encounter: Payer: Self-pay | Admitting: Physician Assistant

## 2023-07-28 ENCOUNTER — Ambulatory Visit: Payer: Medicaid Other | Admitting: Physician Assistant

## 2023-07-28 DIAGNOSIS — R1030 Lower abdominal pain, unspecified: Secondary | ICD-10-CM

## 2023-07-28 DIAGNOSIS — Q846 Other congenital malformations of nails: Secondary | ICD-10-CM | POA: Diagnosis not present

## 2023-07-28 DIAGNOSIS — D72829 Elevated white blood cell count, unspecified: Secondary | ICD-10-CM

## 2023-07-28 DIAGNOSIS — G629 Polyneuropathy, unspecified: Secondary | ICD-10-CM

## 2023-07-28 DIAGNOSIS — B372 Candidiasis of skin and nail: Secondary | ICD-10-CM

## 2023-07-28 DIAGNOSIS — R350 Frequency of micturition: Secondary | ICD-10-CM | POA: Diagnosis not present

## 2023-07-28 DIAGNOSIS — G8929 Other chronic pain: Secondary | ICD-10-CM

## 2023-07-28 DIAGNOSIS — E1169 Type 2 diabetes mellitus with other specified complication: Secondary | ICD-10-CM | POA: Diagnosis not present

## 2023-07-28 DIAGNOSIS — L600 Ingrowing nail: Secondary | ICD-10-CM

## 2023-07-28 NOTE — Progress Notes (Unsigned)
Established patient visit  Patient: Tracy Woods   DOB: 02-06-1971   52 y.o. Female  MRN: [id number] Visit Date: 07/28/2023  Today's healthcare provider: Debera Lat, PA-C   Chief Complaint  Patient presents with   Medical Management of Chronic Issues    Follow up for diabetes, would like to discuss bilateral feet and a referral for feet    Subjective     Discussed the use of AI scribe software for clinical note transcription with the patient, who gave verbal consent to proceed.  History of Present Illness   The patient, with a history of diabetes, gastrointestinal issues, neuropathy, and psoriasis, presents with multiple health concerns. They report a weight loss of 10 pounds since the last visit, which they attribute to dietary changes. However, they have not been able to incorporate physical activity due to persistent pain. The patient has been experiencing pain in various parts of the body, including the back, hip, and thighs. The pain has been severe enough to disrupt sleep and daily activities. The patient also reports a history of urinary tract infections and is currently dealing with toenail issues, which are causing significant discomfort. The patient is not currently taking any medication for diabetes due to gastrointestinal issues and is hesitant to start new medications. They express frustration with the wait time for a scheduled endoscopy and colonoscopy, which is not until October 23rd. The patient also mentions a recent diagnosis of psoriasis and is currently on a 14-day course of fluconazole.           07/28/2023    2:12 PM 06/26/2023    5:02 PM 05/29/2023    1:27 PM  Depression screen PHQ 2/9  Decreased Interest 1 3 3   Down, Depressed, Hopeless 1 3 3   PHQ - 2 Score 2 6 6   Altered sleeping 1 3 2   Tired, decreased energy 1 3 3   Change in appetite 0 3 2  Feeling bad or failure about yourself  1 3 3   Trouble concentrating 1 2 2   Moving slowly or fidgety/restless  0 1 0  Suicidal thoughts 0 1 1  PHQ-9 Score 6 22 19   Difficult doing work/chores   Very difficult      07/28/2023    2:12 PM 06/26/2023    5:02 PM 05/29/2023    1:28 PM 04/29/2023    2:01 PM  GAD 7 : Generalized Anxiety Score  Nervous, Anxious, on Edge 2 3 3 3   Control/stop worrying 2 3 3 3   Worry too much - different things 2 3 2 3   Trouble relaxing 1 3 3 3   Restless 1 0 2 1  Easily annoyed or irritable 1 3 0 2  Afraid - awful might happen 1 3 1 1   Total GAD 7 Score 10 18 14 16   Anxiety Difficulty   Very difficult Very difficult    Medications: Outpatient Medications Prior to Visit  Medication Sig   gabapentin (NEURONTIN) 300 MG capsule Take 1 capsule (300 mg total) by mouth 3 (three) times daily.   hydrOXYzine (VISTARIL) 50 MG capsule Take 1 capsule (50 mg total) by mouth 3 (three) times daily as needed.   pantoprazole (PROTONIX) 40 MG tablet Take 1 tablet (40 mg total) by mouth daily.   No facility-administered medications prior to visit.    Review of Systems  All other systems reviewed and are negative.  Except see HPI   {Insert previous labs (optional):23779} {See past labs  Heme  Chem  Endocrine  Serology  Results Review (optional):1}   Objective    BP 138/85 (BP Location: Left Arm, Patient Position: Sitting, Cuff Size: Large)   Pulse 80   Ht 5\' 4"  (1.626 m)   Wt (!) 340 lb 6.4 oz (154.4 kg)   BMI 58.43 kg/m  {Insert last BP/Wt (optional):23777}{See vitals history (optional):1}   Physical Exam Vitals reviewed.  Constitutional:      General: She is not in acute distress.    Appearance: Normal appearance. She is well-developed. She is not diaphoretic.  HENT:     Head: Normocephalic and atraumatic.  Eyes:     General: No scleral icterus.    Conjunctiva/sclera: Conjunctivae normal.  Neck:     Thyroid: No thyromegaly.  Cardiovascular:     Rate and Rhythm: Normal rate and regular rhythm.     Pulses: Normal pulses.     Heart sounds: Normal heart  sounds. No murmur heard. Pulmonary:     Effort: Pulmonary effort is normal. No respiratory distress.     Breath sounds: Normal breath sounds. No wheezing, rhonchi or rales.  Musculoskeletal:     Cervical back: Neck supple.     Right lower leg: No edema.     Left lower leg: No edema.  Lymphadenopathy:     Cervical: No cervical adenopathy.  Skin:    General: Skin is warm and dry.     Findings: No rash.  Neurological:     Mental Status: She is alert and oriented to person, place, and time. Mental status is at baseline.  Psychiatric:        Mood and Affect: Mood normal.        Behavior: Behavior normal.      No results found for any visits on 07/28/23.  Assessment & Plan    1. Morbid obesity (HCC) ***  2. Type 2 diabetes mellitus with other specified complication, unspecified whether long term insulin use (HCC) *** - Ambulatory referral to Podiatry  3. Ingrown toenail of left foot ***  4. Upslanting toenails ***  5. Neuropathy *** - Ambulatory referral to Neurology  6. Leukocytosis, unspecified type *** - Urine Culture - Urine Microscopic  7. Urinary frequency *** - POCT Urinalysis Dipstick     Diabetes Mellitus Uncontrolled, not currently on medication. Patient has lost weight and changed diet but is experiencing significant GI symptoms. Patient is hesitant to start Ozempic due to GI symptoms and pending colonoscopy. -Consider alternative diabetes medication that will not exacerbate GI symptoms or UTI risk.  Gastrointestinal Pain Chronic, severe, and daily. Pending endoscopy and colonoscopy on October 23rd. -Attempt to expedite GI imaging with another provider if possible.  Urinary Tract Infection (UTI) Recurrent symptoms, self-treating with cranberry juice. -Collect urine sample today for analysis.  Elevated Alkaline Phosphatase Periodic elevation, possibly related to chronic Fluconazole use. -Retest today.  Neuropathy Pain in back and hips, burning  sensation in thighs, severe enough to disrupt sleep. -Refer to neurologist for further evaluation.  Psoriasis Diagnosed by dermatologist, contributing to immunosuppressive state. -Follow up with dermatologist.  Ingrown Toenails Painful, causing discomfort. -Refer to podiatry for evaluation and possible treatment.  Chronic Pain Severe, daily, affecting quality of life. -Refer to pain clinic for comprehensive evaluation and management.  General Health Maintenance / Followup Plans -Return in three months for follow-up, sooner if any worsening of symptoms. -Encourage continued weight loss and diet modification. -Advise patient to contact office if redness, pain, or pus develops in toenails or if GI pain worsens.  Return in about 3 months (around 10/27/2023) for chronic disease f/u.     The patient was advised to call back or seek an in-person evaluation if the symptoms worsen or if the condition fails to improve as anticipated.  I discussed the assessment and treatment plan with the patient. The patient was provided an opportunity to ask questions and all were answered. The patient agreed with the plan and demonstrated an understanding of the instructions.  I, Debera Lat, PA-C have reviewed all documentation for this visit. The documentation on  07/28/23 for the exam, diagnosis, procedures, and orders are all accurate and complete.  Debera Lat, [facility], MMS [facility] [phone] (phone) [phone] (fax)  [facility]

## 2023-07-29 DIAGNOSIS — R350 Frequency of micturition: Secondary | ICD-10-CM | POA: Insufficient documentation

## 2023-07-29 DIAGNOSIS — L600 Ingrowing nail: Secondary | ICD-10-CM | POA: Insufficient documentation

## 2023-07-29 DIAGNOSIS — Q846 Other congenital malformations of nails: Secondary | ICD-10-CM | POA: Insufficient documentation

## 2023-07-29 LAB — URINALYSIS, MICROSCOPIC ONLY
Casts: NONE SEEN /LPF
Epithelial Cells (non renal): >10 /HPF — AB (ref 0–10)
RBC, Urine: NONE SEEN /HPF (ref 0–2)

## 2023-07-29 LAB — POCT URINALYSIS DIPSTICK
Bilirubin, UA: NEGATIVE
Blood, UA: NEGATIVE
Glucose, UA: NEGATIVE
Ketones, UA: NEGATIVE
Nitrite, UA: NEGATIVE
Protein, UA: POSITIVE — AB
Spec Grav, UA: 1.025 (ref 1.010–1.025)
Urobilinogen, UA: 0.2 U/dL
pH, UA: 6 (ref 5.0–8.0)

## 2023-07-31 ENCOUNTER — Ambulatory Visit: Payer: Medicaid Other | Admitting: Gastroenterology

## 2023-07-31 LAB — URINE CULTURE

## 2023-08-01 ENCOUNTER — Other Ambulatory Visit: Payer: Self-pay | Admitting: Physician Assistant

## 2023-08-01 ENCOUNTER — Ambulatory Visit: Payer: Medicaid Other | Admitting: Podiatry

## 2023-08-01 ENCOUNTER — Telehealth: Payer: Self-pay | Admitting: Podiatry

## 2023-08-01 VITALS — BP 147/91

## 2023-08-01 DIAGNOSIS — M79674 Pain in right toe(s): Secondary | ICD-10-CM | POA: Diagnosis not present

## 2023-08-01 DIAGNOSIS — B351 Tinea unguium: Secondary | ICD-10-CM

## 2023-08-01 DIAGNOSIS — M79675 Pain in left toe(s): Secondary | ICD-10-CM | POA: Diagnosis not present

## 2023-08-01 MED ORDER — MUPIROCIN 2 % EX OINT: 1.0000 | TOPICAL_OINTMENT | Freq: Two times a day (BID) | CUTANEOUS | 1 refills | Status: DC

## 2023-08-01 NOTE — Telephone Encounter (Signed)
Sent.  Sorry for the delay.

## 2023-08-01 NOTE — Progress Notes (Signed)
   Chief Complaint  Patient presents with   Diabetes    "I have pain and soreness in the big toe area.  They are ingrown.  I need the other ones done.  I'm Diabetic." N - toenails ingrown L - hallux bilateral D - December 2023 O - gradually  C - thick, discolored, ingrown, debris A - shoes  T - none    SUBJECTIVE Patient PMHx diabetes mellitus presents to office today complaining of elongated, thickened nails that cause pain while ambulating in shoes.  Patient is unable to trim their own nails.  Patient has also had some tenderness along the lateral border of the left great toenail plate.  She is concerned for possible ingrown toenail.  Patient is here for further evaluation and treatment.  Past Medical History:  Diagnosis Date   Anxiety and depression 05/10/2023   Candidiasis 06/26/2023   Diabetes mellitus without complication (HCC)    Elevated cholesterol 06/26/2023   History of urinary tract infection 11/18/2022   Hypertension    Morbid obesity (HCC) 05/10/2023   Necrotizing fasciitis (HCC) 10/24/2022   Neuropathy 05/10/2023   Other chronic pain 05/30/2023    Allergies  Allergen Reactions   Amoxicillin Rash    Tolerated Zosyn 09/16/22 with no apparent issue     OBJECTIVE General Patient is awake, alert, and oriented x 3 and in no acute distress. Derm Skin is dry and supple bilateral. Negative open lesions or macerations. Remaining integument unremarkable. Nails are tender, long, thickened and dystrophic with subungual debris, consistent with onychomycosis, 1-5 bilateral. No signs of infection noted.  There is some slight irritation around the lateral border the left great toenail plate. No purulence. Associated sensitivity with light touch Vasc  DP and PT pedal pulses palpable bilaterally. Temperature gradient within normal limits.  Neuro Light touch and protective threshold sensation grossly intact bilaterally.  Musculoskeletal Exam No symptomatic pedal deformities noted  bilateral. Muscular strength within normal limits.  ASSESSMENT 1.  Pain due to onychomycosis of toenails both 2.  Ingrown toenail lateral border left  PLAN OF CARE -Patient evaluated today.  -Instructed to maintain good pedal hygiene and foot care.  -Mechanical debridement of nails 1-5 bilaterally performed using a nail nipper. Filed with dremel without incident.  -Prescription for mupirocin 2% ointment -Return to clinic in 3 mos. routine footcare   Felecia Shelling, DPM [facility]  Dr. Felecia Shelling, DPM    [address] [place], [place] [address]                Office [phone]  Fax [phone]

## 2023-08-01 NOTE — Telephone Encounter (Signed)
Pt called inquiring about an antibacterial ointment that was supposed to be prescribed today, but her pharmacy hasn't received it yet. Pt also wants to know if lidocaine or another topical pain medication can be prescribed as well.

## 2023-08-04 MED ORDER — NITROFURANTOIN MONOHYD MACRO 100 MG PO CAPS
100.0000 mg | ORAL_CAPSULE | Freq: Two times a day (BID) | ORAL | 0 refills | Status: DC
Start: 2023-08-04 — End: 2023-10-08

## 2023-08-04 MED ORDER — FLUCONAZOLE 150 MG PO TABS
150.0000 mg | ORAL_TABLET | Freq: Once | ORAL | 1 refills | Status: AC
Start: 2023-08-04 — End: 2023-08-04

## 2023-08-04 NOTE — Addendum Note (Signed)
Addended by: Debera Lat on: 08/04/2023 07:45 AM   Modules accepted: Orders

## 2023-08-04 NOTE — Addendum Note (Signed)
Addended by: Debera Lat on: 08/04/2023 07:49 AM   Modules accepted: Orders

## 2023-08-19 ENCOUNTER — Other Ambulatory Visit: Payer: Self-pay | Admitting: Physician Assistant

## 2023-08-19 DIAGNOSIS — B379 Candidiasis, unspecified: Secondary | ICD-10-CM

## 2023-08-19 NOTE — Telephone Encounter (Signed)
Requests are too soon for refill.  Requested Prescriptions  Pending Prescriptions Disp Refills   hydrOXYzine (VISTARIL) 50 MG capsule 90 capsule 0    Sig: Take 1 capsule (50 mg total) by mouth 3 (three) times daily as needed.     Ear, Nose, and Throat:  Antihistamines 2 Passed - 08/19/2023  2:16 PM      Passed - Cr in normal range and within 360 days    Creatinine, Ser  Date Value Ref Range Status  06/26/2023 0.76 0.57 - 1.00 mg/dL Final         Passed - Valid encounter within last 12 months    Recent Outpatient Visits           3 weeks ago Morbid obesity [facility])   [place] [facility] [place], [place], PA-C   1 month ago Type 2 diabetes mellitus with other specified complication, unspecified whether long term insulin use (HCC)   [place] [facility] [place], [place], PA-C       Future Appointments             In 2 months Ostwalt, Mooar, PA-C [facility]             gabapentin (NEURONTIN) 300 MG capsule 90 capsule 3    Sig: Take 1 capsule (300 mg total) by mouth 3 (three) times daily.     Neurology: Anticonvulsants - gabapentin Passed - 08/19/2023  2:16 PM      Passed - Cr in normal range and within 360 days    Creatinine, Ser  Date Value Ref Range Status  06/26/2023 0.76 0.57 - 1.00 mg/dL Final         Passed - Completed PHQ-2 or PHQ-9 in the last 360 days      Passed - Valid encounter within last 12 months    Recent Outpatient Visits           3 weeks ago Morbid obesity [facility])   [place] [facility] [place], [place], PA-C   1 month ago Type 2 diabetes mellitus with other specified complication, unspecified whether long term insulin use (HCC)   [place] [facility] [place], [place], PA-C       Future Appointments             In 2 months Ostwalt, Edmon Crape, PA-C [facility]

## 2023-08-19 NOTE — Telephone Encounter (Signed)
Medication Refill - Medication: hydrOXYzine (VISTARIL) 50 MG capsule,gabapentin (NEURONTIN) 300 MG capsule   Has the patient contacted their pharmacy? No. No, more refills.   (Agent: If no, request that the patient contact the pharmacy for the refill. If patient does not wish to contact the pharmacy document the reason why and proceed with request.)   Preferred Pharmacy (with phone number or street name):  [facility] - [place], [place] - [address]  [address] [place] [address]  Phone: [phone] Fax: [phone]  Hours: Not open 24 hours   Has the patient been seen for an appointment in the last year OR does the patient have an upcoming appointment? Yes.    Agent: Please be advised that RX refills may take up to 3 business days. We ask that you follow-up with your pharmacy.

## 2023-08-20 ENCOUNTER — Other Ambulatory Visit: Payer: Self-pay | Admitting: Physician Assistant

## 2023-08-20 DIAGNOSIS — B379 Candidiasis, unspecified: Secondary | ICD-10-CM

## 2023-08-20 NOTE — Telephone Encounter (Signed)
Patient taking 3 times a day, was given 90 days, which is a 30 day supply.   Requested Prescriptions  Pending Prescriptions Disp Refills   hydrOXYzine (VISTARIL) 50 MG capsule [Pharmacy Med Name: HYDROXYZINE PAMOATE 50 MG CAP] 270 capsule 0    Sig: TAKE 1 CAPSULE BY MOUTH THREE TIMES DAILY AS NEEDED     Ear, Nose, and Throat:  Antihistamines 2 Passed - 08/20/2023  9:39 AM      Passed - Cr in normal range and within 360 days    Creatinine, Ser  Date Value Ref Range Status  06/26/2023 0.76 0.57 - 1.00 mg/dL Final         Passed - Valid encounter within last 12 months    Recent Outpatient Visits           3 weeks ago Morbid obesity [facility])   [facility] [place], [place], PA-C   1 month ago Type 2 diabetes mellitus with other specified complication, unspecified whether long term insulin use (HCC)   [place] [facility] [place], [place], PA-C       Future Appointments             In 2 months Ostwalt, Edmon Crape, PA-C [facility]

## 2023-08-26 ENCOUNTER — Encounter: Payer: Self-pay | Admitting: Gastroenterology

## 2023-09-02 ENCOUNTER — Encounter
Admission: RE | Disposition: A | Discharge status: Home / Self Care | Payer: Self-pay | Source: Home / Self Care | Attending: Gastroenterology

## 2023-09-02 ENCOUNTER — Ambulatory Visit: Payer: Medicaid Other | Admitting: Certified Registered"

## 2023-09-02 ENCOUNTER — Encounter: Payer: Self-pay | Admitting: Gastroenterology

## 2023-09-02 ENCOUNTER — Ambulatory Visit
Admission: RE | Admit: 2023-09-02 | Discharge: 2023-09-02 | Disposition: A | Discharge status: Home / Self Care | Payer: Medicaid Other | Attending: Gastroenterology | Admitting: Gastroenterology

## 2023-09-02 DIAGNOSIS — Z6841 Body Mass Index (BMI) 40.0 and over, adult: Secondary | ICD-10-CM | POA: Insufficient documentation

## 2023-09-02 DIAGNOSIS — Z1211 Encounter for screening for malignant neoplasm of colon: Secondary | ICD-10-CM

## 2023-09-02 DIAGNOSIS — Z87891 Personal history of nicotine dependence: Secondary | ICD-10-CM | POA: Diagnosis not present

## 2023-09-02 DIAGNOSIS — I10 Essential (primary) hypertension: Secondary | ICD-10-CM | POA: Diagnosis not present

## 2023-09-02 DIAGNOSIS — Z5982 Transportation insecurity: Secondary | ICD-10-CM | POA: Insufficient documentation

## 2023-09-02 DIAGNOSIS — D126 Benign neoplasm of colon, unspecified: Secondary | ICD-10-CM

## 2023-09-02 DIAGNOSIS — R109 Unspecified abdominal pain: Secondary | ICD-10-CM | POA: Diagnosis not present

## 2023-09-02 DIAGNOSIS — Z5986 Financial insecurity: Secondary | ICD-10-CM | POA: Diagnosis not present

## 2023-09-02 DIAGNOSIS — K635 Polyp of colon: Secondary | ICD-10-CM

## 2023-09-02 DIAGNOSIS — D125 Benign neoplasm of sigmoid colon: Secondary | ICD-10-CM | POA: Insufficient documentation

## 2023-09-02 DIAGNOSIS — R1032 Left lower quadrant pain: Secondary | ICD-10-CM

## 2023-09-02 DIAGNOSIS — D124 Benign neoplasm of descending colon: Secondary | ICD-10-CM | POA: Diagnosis not present

## 2023-09-02 DIAGNOSIS — Z79899 Other long term (current) drug therapy: Secondary | ICD-10-CM | POA: Insufficient documentation

## 2023-09-02 HISTORY — PX: POLYPECTOMY: SHX5525

## 2023-09-02 HISTORY — PX: COLONOSCOPY WITH PROPOFOL: SHX5780

## 2023-09-02 HISTORY — PX: ESOPHAGOGASTRODUODENOSCOPY (EGD) WITH PROPOFOL: SHX5813

## 2023-09-02 LAB — GLUCOSE, CAPILLARY: Glucose-Capillary: 115 mg/dL — ABNORMAL HIGH (ref 70–99)

## 2023-09-02 SURGERY — ESOPHAGOGASTRODUODENOSCOPY (EGD) WITH PROPOFOL
Anesthesia: General

## 2023-09-02 MED ORDER — LIDOCAINE HCL (PF) 2 % IJ SOLN
INTRAMUSCULAR | Status: DC | PRN
  Administered 2023-09-02: 60 mg via INTRADERMAL

## 2023-09-02 MED ORDER — LIDOCAINE HCL (PF) 2 % IJ SOLN
INTRAMUSCULAR | Status: AC
  Filled 2023-09-02: qty 10

## 2023-09-02 MED ORDER — MIDAZOLAM HCL 2 MG/2ML IJ SOLN
INTRAMUSCULAR | Status: AC
  Filled 2023-09-02: qty 2

## 2023-09-02 MED ORDER — SODIUM CHLORIDE 0.9 % IV SOLN
INTRAVENOUS | Status: DC
  Administered 2023-09-02: 250 mL via INTRAVENOUS

## 2023-09-02 MED ORDER — MIDAZOLAM HCL 2 MG/2ML IJ SOLN
INTRAMUSCULAR | Status: DC | PRN
  Administered 2023-09-02: 2 mg via INTRAVENOUS

## 2023-09-02 MED ORDER — PROPOFOL 1000 MG/100ML IV EMUL
INTRAVENOUS | Status: AC
  Filled 2023-09-02: qty 100

## 2023-09-02 MED ORDER — LIDOCAINE HCL (PF) 2 % IJ SOLN
INTRAMUSCULAR | Status: AC
  Filled 2023-09-02: qty 5

## 2023-09-02 MED ORDER — PROPOFOL 10 MG/ML IV BOLUS
INTRAVENOUS | Status: DC | PRN
  Administered 2023-09-02 (×3): 30 mg via INTRAVENOUS
  Administered 2023-09-02: 50 mg via INTRAVENOUS
  Administered 2023-09-02: 30 mg via INTRAVENOUS
  Administered 2023-09-02: 40 mg via INTRAVENOUS
  Administered 2023-09-02 (×3): 30 mg via INTRAVENOUS

## 2023-09-02 NOTE — H&P (Signed)
Wyline Mood, MD [address], [place], [place], [address] [address], [place], [place], [address] Phone: [phone]  Fax: [phone]  Primary Care Physician:  Debera Lat, PA-C   Pre-Procedure History & Physical: HPI:  Tracy Woods is a 52 y.o. female is here for an endoscopy and colonoscopy    Past Medical History:  Diagnosis Date   Anxiety and depression 05/10/2023   Candidiasis 06/26/2023   Diabetes mellitus without complication (HCC)    Elevated cholesterol 06/26/2023   History of urinary tract infection 11/18/2022   Hypertension    Morbid obesity (HCC) 05/10/2023   Necrotizing fasciitis (HCC) 10/24/2022   Neuropathy 05/10/2023   Other chronic pain 05/30/2023    Past Surgical History:  Procedure Laterality Date   INCISION AND DRAINAGE OF WOUND Bilateral 09/15/2022   Procedure: IRRIGATION AND DEBRIDEMENT WOUND;  Surgeon: Carolan Shiver, MD;  Location: [facility] ORS;  Service: General;  Laterality: Bilateral;   TONSILLECTOMY      Prior to Admission medications   Medication Sig Start Date End Date Taking? Authorizing Provider  gabapentin (NEURONTIN) 300 MG capsule Take 1 capsule (300 mg total) by mouth 3 (three) times daily. 04/29/23  Yes Kara Dies, NP  hydrOXYzine (VISTARIL) 50 MG capsule TAKE 1 CAPSULE BY MOUTH THREE TIMES DAILY AS NEEDED 08/20/23  Yes Ostwalt, Edmon Crape, PA-C  mupirocin ointment (BACTROBAN) 2 % Apply 1 Application topically 2 (two) times daily. 08/01/23  Yes Felecia Shelling, DPM  pantoprazole (PROTONIX) 40 MG tablet Take 1 tablet (40 mg total) by mouth daily. 07/16/23 10/14/23 Yes Celso Amy, PA-C  nitrofurantoin, macrocrystal-monohydrate, (MACROBID) 100 MG capsule Take 1 capsule (100 mg total) by mouth 2 (two) times daily. Patient not taking: Reported on 09/02/2023 08/04/23   Debera Lat, PA-C    Allergies as of 07/16/2023 - Review Complete 07/16/2023  Allergen Reaction Noted   Amoxicillin Rash 09/15/2022     Family History  Problem Relation Age of Onset   Stroke Mother    Miscarriages / [place] Mother    Hyperlipidemia Mother    Hypertension Mother    Heart disease Mother    Hearing loss Mother    Diabetes Mother     Social History   Socioeconomic History   Marital status: Single    Spouse name: Not on file   Number of children: Not on file   Years of education: Not on file   Highest education level: GED or equivalent  Occupational History   Not on file  Tobacco Use   Smoking status: Former    Types: Cigarettes   Smokeless tobacco: Never  Vaping Use   Vaping status: Never Used  Substance and Sexual Activity   Alcohol use: Not Currently   Drug use: Not Currently   Sexual activity: Not on file  Other Topics Concern   Not on file  Social History Narrative   Not on file   Social Determinants of Health   Financial Resource Strain: High Risk (05/28/2023)   Overall Financial Resource Strain (CARDIA)    Difficulty of Paying Living Expenses: Very hard  Food Insecurity: Food Insecurity Present (05/28/2023)   Hunger Vital Sign    Worried About Running Out of Food in the Last Year: Often true    Ran Out of Food in the Last Year: Often true  Transportation Needs: Unmet Transportation Needs (05/29/2023)   PRAPARE - Administrator, Civil Service (Medical): Yes    Lack of Transportation (Non-Medical): Yes  Physical Activity: Unknown (05/28/2023)   Exercise Vital Sign    Days of Exercise per Week: 0 days    Minutes of Exercise per Session: Not on file  Stress: Stress Concern Present (05/28/2023)   [employer] of Occupational Health - Occupational Stress Questionnaire    Feeling of Stress : Very much  Social Connections: Socially Isolated (05/28/2023)   Social Connection and Isolation Panel [NHANES]    Frequency of Communication with Friends and Family: Never    Frequency of Social Gatherings with Friends and Family: Never    Attends Religious Services:  Never    Database administrator or Organizations: No    Attends Engineer, structural: Not on file    Marital Status: Never married  Catering manager Violence: Not on file    Review of Systems: See HPI, otherwise negative ROS  Physical Exam: BP (!) 164/88   Pulse 85   Temp (!) 96.6 F (35.9 C) (Temporal)   Resp 20   Ht 5\' 6"  (1.676 m)   Wt (!) 158.4 kg   SpO2 97%   BMI 56.38 kg/m  General:   Alert,  pleasant and cooperative in NAD Head:  Normocephalic and atraumatic. Neck:  Supple; no masses or thyromegaly. Lungs:  Clear throughout to auscultation, normal respiratory effort.    Heart:  +S1, +S2, Regular rate and rhythm, No edema. Abdomen:  Soft, nontender and nondistended. Normal bowel sounds, without guarding, and without rebound.   Neurologic:  Alert and  oriented x4;  grossly normal neurologically.  Impression/Plan: Tracy Woods is here for an endoscopy and colonoscopy  to be performed for  evaluation of abdominal pain and colon cancer screening     Risks, benefits, limitations, and alternatives regarding endoscopy have been reviewed with the patient.  Questions have been answered.  All parties agreeable.   Wyline Mood, MD  09/02/2023, 7:48 AM

## 2023-09-02 NOTE — Op Note (Signed)
[facility] Gastroenterology Patient Name: Tracy Woods Procedure Date: 09/02/2023 7:22 AM MRN: [id number] Account #: [id number] Date of Birth: 04/15/1971 Admit Type: Outpatient Age: 52 Room: [facility] ENDO ROOM 2 Gender: Female Note Status: Finalized Instrument Name: Prentice Docker [id number] Procedure:             Colonoscopy Indications:           Screening for colorectal malignant neoplasm Providers:             Wyline Mood MD, MD Medicines:             Monitored Anesthesia Care Complications:         No immediate complications. Procedure:             Pre-Anesthesia Assessment:                        - Prior to the procedure, a History and Physical was                         performed, and patient medications, allergies and                         sensitivities were reviewed. The patient's tolerance                         of previous anesthesia was reviewed.                        - The risks and benefits of the procedure and the                         sedation options and risks were discussed with the                         patient. All questions were answered and informed                         consent was obtained.                        - ASA Grade Assessment: II - A patient with mild                         systemic disease.                        After obtaining informed consent, the colonoscope was                         passed under direct vision. Throughout the procedure,                         the patient's blood pressure, pulse, and oxygen                         saturations were monitored continuously. The                         Colonoscope was introduced through the anus and  advanced to the the cecum, identified by the                         appendiceal orifice. The colonoscopy was performed                         with ease. The patient tolerated the procedure well.                         The quality of the bowel  preparation was excellent.                         The ileocecal valve, appendiceal orifice, and rectum                         were photographed. Findings:      The perianal and digital rectal examinations were normal.      Two sessile polyps were found in the descending colon. The polyps were 6       to 9 mm in size. These polyps were removed with a cold snare. Resection       and retrieval were complete.      Three sessile polyps were found in the sigmoid colon. The polyps were 4       to 6 mm in size. These polyps were removed with a cold snare. Resection       and retrieval were complete.      No additional abnormalities were found on retroflexion.      The exam was otherwise without abnormality on direct and retroflexion       views. Impression:            - Two 6 to 9 mm polyps in the descending colon,                         removed with a cold snare. Resected and retrieved.                        - Three 4 to 6 mm polyps in the sigmoid colon, removed                         with a cold snare. Resected and retrieved.                        - The examination was otherwise normal on direct and                         retroflexion views. Recommendation:        - Discharge patient to home (with escort).                        - Resume previous diet.                        - Continue present medications.                        - Await pathology results.                        -  Repeat colonoscopy for surveillance based on                         pathology results. Procedure Code(s):     --- Professional ---                        [phone], Colonoscopy, flexible; with removal of                         tumor(s), polyp(s), or other lesion(s) by snare                         technique Diagnosis Code(s):     --- Professional ---                        Z12.11, Encounter for screening for malignant neoplasm                         of colon                        D12.4, Benign neoplasm of  descending colon                        D12.5, Benign neoplasm of sigmoid colon CPT copyright 2022 American Medical Association. All rights reserved. The codes documented in this report are preliminary and upon coder review may  be revised to meet current compliance requirements. Wyline Mood, MD Wyline Mood MD, MD 09/02/2023 8:23:02 AM This report has been signed electronically. Number of Addenda: 0 Note Initiated On: 09/02/2023 7:22 AM Scope Withdrawal Time: 0 hours 10 minutes 25 seconds  Total Procedure Duration: 0 hours 16 minutes 25 seconds  Estimated Blood Loss:  Estimated blood loss: none.      [facility]

## 2023-09-02 NOTE — Anesthesia Postprocedure Evaluation (Signed)
Anesthesia Post Note  Patient: Tracy Woods  Procedure(s) Performed: ESOPHAGOGASTRODUODENOSCOPY (EGD) WITH PROPOFOL COLONOSCOPY WITH PROPOFOL POLYPECTOMY  Patient location during evaluation: Endoscopy Anesthesia Type: General Level of consciousness: awake and alert Pain management: pain level controlled Vital Signs Assessment: post-procedure vital signs reviewed and stable Respiratory status: spontaneous breathing, nonlabored ventilation, respiratory function stable and patient connected to nasal cannula oxygen Cardiovascular status: blood pressure returned to baseline and stable Postop Assessment: no apparent nausea or vomiting Anesthetic complications: no   No notable events documented.   Last Vitals:  Vitals:   09/02/23 0833 09/02/23 0844  BP: (!) 149/70 (!) 153/72  Pulse: 92 85  Resp: 16 (!) 21  Temp:    SpO2: 98% 98%    Last Pain:  Vitals:   09/02/23 0844  TempSrc:   PainSc: 0-No pain                 Corinda Gubler

## 2023-09-02 NOTE — Anesthesia Preprocedure Evaluation (Signed)
Anesthesia Evaluation  Patient identified by MRN, date of birth, ID band Patient awake    Reviewed: Allergy & Precautions, NPO status , Patient's Chart, lab work & pertinent test results  History of Anesthesia Complications Negative for: history of anesthetic complications  Airway Mallampati: III  TM Distance: <3 FB Neck ROM: Full    Dental no notable dental hx. (+) Teeth Intact   Pulmonary shortness of breath and with exertion, neg sleep apnea, neg COPD, Patient abstained from smoking.Not current smoker, former smoker No diagnosis of OSA but has signs and symptoms of it    + decreased breath sounds      Cardiovascular Exercise Tolerance: Poor METShypertension, Pt. on medications (-) CAD and (-) Past MI (-) dysrhythmias  Rhythm:Regular Rate:Normal - Systolic murmurs    Neuro/Psych  PSYCHIATRIC DISORDERS Anxiety Depression    negative neurological ROS     GI/Hepatic ,neg GERD  ,,(+)     (-) substance abuse    Endo/Other  diabetes  Morbid obesity  Renal/GU negative Renal ROS     Musculoskeletal   Abdominal  (+) + obese  Peds  Hematology   Anesthesia Other Findings Past Medical History: 05/10/2023: Anxiety and depression 06/26/2023: Candidiasis No date: Diabetes mellitus without complication (HCC) 06/26/2023: Elevated cholesterol 11/18/2022: History of urinary tract infection No date: Hypertension 05/10/2023: Morbid obesity (HCC) 10/24/2022: Necrotizing fasciitis (HCC) 05/10/2023: Neuropathy 05/30/2023: Other chronic pain  Reproductive/Obstetrics                             Anesthesia Physical Anesthesia Plan  ASA: 3  Anesthesia Plan: General   Post-op Pain Management: Minimal or no pain anticipated   Induction: Intravenous  PONV Risk Score and Plan: 3 and Propofol infusion, TIVA, Ondansetron and Midazolam  Airway Management Planned: Nasal CPAP and Natural  Airway  Additional Equipment: None  Intra-op Plan:   Post-operative Plan:   Informed Consent: I have reviewed the patients History and Physical, chart, labs and discussed the procedure including the risks, benefits and alternatives for the proposed anesthesia with the patient or authorized representative who has indicated his/her understanding and acceptance.     Dental advisory given  Plan Discussed with: CRNA and Surgeon  Anesthesia Plan Comments: (Discussed risks of anesthesia with patient, including possibility of difficulty with spontaneous ventilation under anesthesia necessitating airway intervention, PONV, and rare risks such as cardiac or respiratory or neurological events, and allergic reactions. Discussed the role of CRNA in patient's perioperative care. Patient understands. Patient informed about increased incidence of above perioperative risk due to high BMI. Patient understands.  )        Anesthesia Quick Evaluation

## 2023-09-02 NOTE — Op Note (Signed)
[facility] Gastroenterology Patient Name: Tracy Woods Procedure Date: 09/02/2023 7:22 AM MRN: [id number] Account #: [id number] Date of Birth: 1971/06/15 Admit Type: Outpatient Age: 52 Room: [facility] ENDO ROOM 2 Gender: Female Note Status: Finalized Instrument Name: Upper Endoscope [id number] Procedure:             Upper GI endoscopy Indications:           Abdominal pain Providers:             Wyline Mood MD, MD Medicines:             Monitored Anesthesia Care Complications:         No immediate complications. Procedure:             Pre-Anesthesia Assessment:                        - Prior to the procedure, a History and Physical was                         performed, and patient medications, allergies and                         sensitivities were reviewed. The patient's tolerance                         of previous anesthesia was reviewed.                        - The risks and benefits of the procedure and the                         sedation options and risks were discussed with the                         patient. All questions were answered and informed                         consent was obtained.                        - ASA Grade Assessment: II - A patient with mild                         systemic disease.                        After obtaining informed consent, the endoscope was                         passed under direct vision. Throughout the procedure,                         the patient's blood pressure, pulse, and oxygen                         saturations were monitored continuously. The Endoscope                         was introduced through the mouth, and advanced to the  third part of duodenum. The upper GI endoscopy was                         accomplished with ease. The patient tolerated the                         procedure well. Findings:      The esophagus was normal.      The stomach was normal.      The  examined duodenum was normal. Impression:            - Normal esophagus.                        - Normal stomach.                        - Normal examined duodenum.                        - No specimens collected. Recommendation:        - Perform a colonoscopy today. Procedure Code(s):     --- Professional ---                        [phone], Esophagogastroduodenoscopy, flexible,                         transoral; diagnostic, including collection of                         specimen(s) by brushing or washing, when performed                         (separate procedure) Diagnosis Code(s):     --- Professional ---                        R10.9, Unspecified abdominal pain CPT copyright 2022 American Medical Association. All rights reserved. The codes documented in this report are preliminary and upon coder review may  be revised to meet current compliance requirements. Wyline Mood, MD Wyline Mood MD, MD 09/02/2023 8:02:47 AM This report has been signed electronically. Number of Addenda: 0 Note Initiated On: 09/02/2023 7:22 AM Estimated Blood Loss:  Estimated blood loss: none.      [facility]

## 2023-09-02 NOTE — Transfer of Care (Signed)
Immediate Anesthesia Transfer of Care Note  Patient: Marieka Belue  Procedure(s) Performed: ESOPHAGOGASTRODUODENOSCOPY (EGD) WITH PROPOFOL COLONOSCOPY WITH PROPOFOL POLYPECTOMY  Patient Location: PACU and Endoscopy Unit  Anesthesia Type:MAC  Level of Consciousness: sedated  Airway & Oxygen Therapy: Patient Spontanous Breathing  Post-op Assessment: Report given to RN and Post -op Vital signs reviewed and stable  Post vital signs: Reviewed and stable  Last Vitals:  Vitals Value Taken Time  BP 129/70 09/02/23 0824  Temp 36.2 C 09/02/23 0824  Pulse 85 09/02/23 0824  Resp 21 09/02/23 0824  SpO2 99 % 09/02/23 0824    Last Pain:  Vitals:   09/02/23 0824  TempSrc: Temporal  PainSc: Asleep         Complications: No notable events documented.

## 2023-09-03 ENCOUNTER — Encounter: Payer: Self-pay | Admitting: Gastroenterology

## 2023-09-03 LAB — SURGICAL PATHOLOGY

## 2023-09-16 ENCOUNTER — Telehealth: Payer: Self-pay

## 2023-09-16 ENCOUNTER — Encounter: Payer: Self-pay | Admitting: Physician Assistant

## 2023-09-16 DIAGNOSIS — E1169 Type 2 diabetes mellitus with other specified complication: Secondary | ICD-10-CM

## 2023-09-16 DIAGNOSIS — Z124 Encounter for screening for malignant neoplasm of cervix: Secondary | ICD-10-CM

## 2023-09-16 DIAGNOSIS — Z0100 Encounter for examination of eyes and vision without abnormal findings: Secondary | ICD-10-CM

## 2023-09-16 DIAGNOSIS — Z01419 Encounter for gynecological examination (general) (routine) without abnormal findings: Secondary | ICD-10-CM

## 2023-09-16 NOTE — Telephone Encounter (Signed)
Copied from CRM [id number]. Topic: Referral - Request for Referral >> Sep 16, 2023 11:22 AM Everette C wrote: Has patient seen PCP for this complaint? Yes.   *If NO, is insurance requiring patient see PCP for this issue before PCP can refer them? Referral for which specialty: OBGYN  Preferred provider/office: Patient has no preference of office but would like a female Provider  Reason for referral: Continued female care

## 2023-09-16 NOTE — Telephone Encounter (Signed)
Copied from CRM [id number]. Topic: Referral - Request for Referral >> Sep 16, 2023 11:26 AM Everette C wrote: Has patient seen PCP for this complaint? No. *If NO, is insurance requiring patient see PCP for this issue before PCP can refer them? Referral for which specialty: Optometrist  Preferred provider/office: Patient has no preference  Reason for referral: Patient is in need of an eye exam

## 2023-09-24 ENCOUNTER — Telehealth: Payer: Self-pay | Admitting: Physician Assistant

## 2023-09-24 NOTE — Telephone Encounter (Signed)
Courtney from [facility] calling to leave a message for PCP stated pt was seen in office today.  Pt reported currently not taking Metformin, Insulin, or Ozempic. Currently, pt is only taking Hydroxyzine and Gabapentin and was encouraging to follow up with her PCP.  Courtney from [facility] wanted to report pt not being not being complaint with medication. Please advise.

## 2023-09-28 NOTE — Progress Notes (Deleted)
PCP: Debera Lat, PA-C   No chief complaint on file.   HPI:      Ms. Tracy Woods is a 52 y.o. No obstetric history on file. whose LMP was No LMP recorded. Patient is postmenopausal., presents today for her NP annual examination.  Her menses are {norm/abn:715}, lasting {number: 22536} days.  Dysmenorrhea {dysmen:716}. She {does:18564} have intermenstrual bleeding. She {does:18564} have vasomotor sx.   Sex activity: {sex active: 315163}. She {does:18564} have vaginal dryness.  Last Pap: {ZOXW:960454098}  Results were: {norm/abn:16707::"no abnormalities"} /neg HPV DNA.  Hx of STDs: {STD hx:14358}  Last mammogram: {date:304500300}  Results were: {norm/abn:13465} There is no FH of breast cancer. There is no FH of ovarian cancer. The patient {does:18564} do self-breast exams.  Colonoscopy: {hx:15363}  Repeat due after 10*** years.   Tobacco use: {tob:20664} Alcohol use: {Alcohol:11675} No drug use Exercise: {exercise:31265}  She {does:18564} get adequate calcium and Vitamin D in her diet.  Labs with PCP.   Patient Active Problem List   Diagnosis Date Noted   Adenomatous polyp of colon 09/02/2023   LLQ abdominal pain 09/02/2023   Ingrown toenail of left foot 07/29/2023   Upslanting toenails 07/29/2023   Urinary frequency 07/29/2023   Elevated cholesterol 06/26/2023   Bilateral lower abdominal discomfort 06/26/2023   Urinary problem in female 06/26/2023   Candidiasis 06/26/2023   Other chronic pain 05/30/2023   Abdominal pain 05/29/2023   Morbid obesity (HCC) 05/10/2023   Type 2 diabetes mellitus with other specified complication (HCC) 05/10/2023   Dyspnea 05/10/2023   Anxiety and depression 05/10/2023   Neuropathy 05/10/2023   Clinical diagnosis of COVID-19 11/25/2022   Aftercare following surgery of the skin or subcutaneous tissue 11/18/2022   Anxiety 11/18/2022   Candidal intertrigo 11/18/2022   Colon cancer screening 11/18/2022   History of urinary tract  infection 11/18/2022   Pseudomonas (aeruginosa) (mallei) (pseudomallei) as the cause of diseases classified elsewhere 11/18/2022   Necrotizing fasciitis (HCC) 10/24/2022   Severe sepsis with lactic acidosis (HCC) 09/15/2022    Past Surgical History:  Procedure Laterality Date   COLONOSCOPY WITH PROPOFOL N/A 09/02/2023   Procedure: COLONOSCOPY WITH PROPOFOL;  Surgeon: Wyline Mood, MD;  Location: [facility] ENDOSCOPY;  Service: Gastroenterology;  Laterality: N/A;   ESOPHAGOGASTRODUODENOSCOPY (EGD) WITH PROPOFOL N/A 09/02/2023   Procedure: ESOPHAGOGASTRODUODENOSCOPY (EGD) WITH PROPOFOL;  Surgeon: Wyline Mood, MD;  Location: [facility] ENDOSCOPY;  Service: Gastroenterology;  Laterality: N/A;   INCISION AND DRAINAGE OF WOUND Bilateral 09/15/2022   Procedure: IRRIGATION AND DEBRIDEMENT WOUND;  Surgeon: Carolan Shiver, MD;  Location: [facility] ORS;  Service: General;  Laterality: Bilateral;   POLYPECTOMY  09/02/2023   Procedure: POLYPECTOMY;  Surgeon: Wyline Mood, MD;  Location: [facility] ENDOSCOPY;  Service: Gastroenterology;;   TONSILLECTOMY      Family History  Problem Relation Age of Onset   Stroke Mother    Miscarriages / [place] Mother    Hyperlipidemia Mother    Hypertension Mother    Heart disease Mother    Hearing loss Mother    Diabetes Mother     Social History   Socioeconomic History   Marital status: Single    Spouse name: Not on file   Number of children: Not on file   Years of education: Not on file   Highest education level: GED or equivalent  Occupational History   Not on file  Tobacco Use   Smoking status: Former    Types: Cigarettes   Smokeless tobacco: Never  Vaping Use   Vaping status:  Never Used  Substance and Sexual Activity   Alcohol use: Not Currently   Drug use: Not Currently   Sexual activity: Not on file  Other Topics Concern   Not on file  Social History Narrative   Not on file   Social Determinants of Health   Financial Resource Strain: High Risk  (09/24/2023)   Received from [facility]   Overall Financial Resource Strain (CARDIA)    Difficulty of Paying Living Expenses: Very hard  Food Insecurity: Food Insecurity Present (09/24/2023)   Received from [facility]   Hunger Vital Sign    Worried About Running Out of Food in the Last Year: Often true    Ran Out of Food in the Last Year: Sometimes true  Transportation Needs: No Transportation Needs (09/24/2023)   Received from [facility] - Transportation    In the past 12 months, has lack of transportation kept you from medical appointments or from getting medications?: No    Lack of Transportation (Non-Medical): No  Physical Activity: Unknown (05/28/2023)   Exercise Vital Sign    Days of Exercise per Week: 0 days    Minutes of Exercise per Session: Not on file  Stress: Stress Concern Present (05/28/2023)   [employer] of Occupational Health - Occupational Stress Questionnaire    Feeling of Stress : Very much  Social Connections: Socially Isolated (05/28/2023)   Social Connection and Isolation Panel [NHANES]    Frequency of Communication with Friends and Family: Never    Frequency of Social Gatherings with Friends and Family: Never    Attends Religious Services: Never    Database administrator or Organizations: No    Attends Engineer, structural: Not on file    Marital Status: Never married  Intimate Partner Violence: Not on file     Current Outpatient Medications:    gabapentin (NEURONTIN) 300 MG capsule, Take 1 capsule (300 mg total) by mouth 3 (three) times daily., Disp: 90 capsule, Rfl: 3   hydrOXYzine (VISTARIL) 50 MG capsule, TAKE 1 CAPSULE BY MOUTH THREE TIMES DAILY AS NEEDED, Disp: 270 capsule, Rfl: 0   mupirocin ointment (BACTROBAN) 2 %, Apply 1 Application topically 2 (two) times daily., Disp: 22 g, Rfl: 1   nitrofurantoin, macrocrystal-monohydrate, (MACROBID) 100 MG capsule, Take 1 capsule  (100 mg total) by mouth 2 (two) times daily. (Patient not taking: Reported on 09/02/2023), Disp: 14 capsule, Rfl: 0   pantoprazole (PROTONIX) 40 MG tablet, Take 1 tablet (40 mg total) by mouth daily., Disp: 30 tablet, Rfl: 2     ROS:  Review of Systems BREAST: No symptoms    Objective: There were no vitals taken for this visit.   OBGyn Exam  Results: No results found for this or any previous visit (from the past 24 hour(s)).  Assessment/Plan:  No diagnosis found.   No orders of the defined types were placed in this encounter.           GYN counsel {counseling: 16159}    F/U  No follow-ups on file.  Katiejo Gilroy B. Nidhi Jacome, PA-C 09/28/2023 5:18 PM

## 2023-09-29 ENCOUNTER — Encounter: Payer: Self-pay | Admitting: Obstetrics and Gynecology

## 2023-09-30 ENCOUNTER — Encounter: Payer: Medicaid Other | Admitting: Obstetrics and Gynecology

## 2023-10-04 ENCOUNTER — Inpatient Hospital Stay
Admission: EM | Admit: 2023-10-04 | Discharge: 2023-10-08 | DRG: 603 | Disposition: A | Discharge status: Home / Self Care | Payer: Medicaid Other | Attending: Internal Medicine | Admitting: Internal Medicine

## 2023-10-04 ENCOUNTER — Other Ambulatory Visit: Payer: Self-pay

## 2023-10-04 DIAGNOSIS — Z880 Allergy status to penicillin: Secondary | ICD-10-CM

## 2023-10-04 DIAGNOSIS — Z8249 Family history of ischemic heart disease and other diseases of the circulatory system: Secondary | ICD-10-CM

## 2023-10-04 DIAGNOSIS — Z87891 Personal history of nicotine dependence: Secondary | ICD-10-CM

## 2023-10-04 DIAGNOSIS — Z823 Family history of stroke: Secondary | ICD-10-CM

## 2023-10-04 DIAGNOSIS — F419 Anxiety disorder, unspecified: Secondary | ICD-10-CM | POA: Diagnosis present

## 2023-10-04 DIAGNOSIS — R609 Edema, unspecified: Secondary | ICD-10-CM | POA: Diagnosis present

## 2023-10-04 DIAGNOSIS — E7800 Pure hypercholesterolemia, unspecified: Secondary | ICD-10-CM | POA: Diagnosis present

## 2023-10-04 DIAGNOSIS — Z822 Family history of deafness and hearing loss: Secondary | ICD-10-CM

## 2023-10-04 DIAGNOSIS — Z6841 Body Mass Index (BMI) 40.0 and over, adult: Secondary | ICD-10-CM

## 2023-10-04 DIAGNOSIS — K439 Ventral hernia without obstruction or gangrene: Secondary | ICD-10-CM | POA: Insufficient documentation

## 2023-10-04 DIAGNOSIS — E1169 Type 2 diabetes mellitus with other specified complication: Secondary | ICD-10-CM | POA: Diagnosis present

## 2023-10-04 DIAGNOSIS — I10 Essential (primary) hypertension: Secondary | ICD-10-CM | POA: Diagnosis present

## 2023-10-04 DIAGNOSIS — L03311 Cellulitis of abdominal wall: Principal | ICD-10-CM | POA: Insufficient documentation

## 2023-10-04 DIAGNOSIS — F32A Depression, unspecified: Secondary | ICD-10-CM | POA: Diagnosis present

## 2023-10-04 DIAGNOSIS — L02215 Cutaneous abscess of perineum: Secondary | ICD-10-CM | POA: Diagnosis present

## 2023-10-04 DIAGNOSIS — Z833 Family history of diabetes mellitus: Secondary | ICD-10-CM

## 2023-10-04 DIAGNOSIS — L304 Erythema intertrigo: Secondary | ICD-10-CM | POA: Diagnosis present

## 2023-10-04 DIAGNOSIS — Z8739 Personal history of other diseases of the musculoskeletal system and connective tissue: Secondary | ICD-10-CM

## 2023-10-04 DIAGNOSIS — Z83438 Family history of other disorder of lipoprotein metabolism and other lipidemia: Secondary | ICD-10-CM

## 2023-10-04 DIAGNOSIS — Z79899 Other long term (current) drug therapy: Secondary | ICD-10-CM

## 2023-10-04 DIAGNOSIS — Z5986 Financial insecurity: Secondary | ICD-10-CM

## 2023-10-04 LAB — CBC WITH DIFFERENTIAL/PLATELET
Abs Immature Granulocytes: 0.04 10*3/uL (ref 0.00–0.07)
Basophils Absolute: 0 10*3/uL (ref 0.0–0.1)
Basophils Relative: 0 %
Eosinophils Absolute: 0.1 10*3/uL (ref 0.0–0.5)
Eosinophils Relative: 1 %
HCT: 41.3 % (ref 36.0–46.0)
Hemoglobin: 14.6 g/dL (ref 12.0–15.0)
Immature Granulocytes: 0 %
Lymphocytes Relative: 18 %
Lymphs Abs: 1.8 10*3/uL (ref 0.7–4.0)
MCH: 29 pg (ref 26.0–34.0)
MCHC: 35.4 g/dL (ref 30.0–36.0)
MCV: 81.9 fL (ref 80.0–100.0)
Monocytes Absolute: 0.6 10*3/uL (ref 0.1–1.0)
Monocytes Relative: 6 %
Neutro Abs: 7.4 10*3/uL (ref 1.7–7.7)
Neutrophils Relative %: 75 %
Platelets: 272 10*3/uL (ref 150–400)
RBC: 5.04 MIL/uL (ref 3.87–5.11)
RDW: 12.8 % (ref 11.5–15.5)
WBC: 10 10*3/uL (ref 4.0–10.5)
nRBC: 0 % (ref 0.0–0.2)

## 2023-10-04 LAB — COMPREHENSIVE METABOLIC PANEL
ALT: 18 U/L (ref 0–44)
AST: 18 U/L (ref 15–41)
Albumin: 3.4 g/dL — ABNORMAL LOW (ref 3.5–5.0)
Alkaline Phosphatase: 116 U/L (ref 38–126)
Anion gap: 10 (ref 5–15)
BUN: 23 mg/dL — ABNORMAL HIGH (ref 6–20)
CO2: 23 mmol/L (ref 22–32)
Calcium: 9 mg/dL (ref 8.9–10.3)
Chloride: 102 mmol/L (ref 98–111)
Creatinine, Ser: 0.61 mg/dL (ref 0.44–1.00)
GFR, Estimated: >60 mL/min (ref >60)
Glucose, Bld: 134 mg/dL — ABNORMAL HIGH (ref 70–99)
Potassium: 4 mmol/L (ref 3.5–5.1)
Sodium: 135 mmol/L (ref 135–145)
Total Bilirubin: 1.3 mg/dL — ABNORMAL HIGH (ref <1.2)
Total Protein: 8.4 g/dL — ABNORMAL HIGH (ref 6.5–8.1)

## 2023-10-04 MED ORDER — VANCOMYCIN HCL IN DEXTROSE 1-5 GM/200ML-% IV SOLN
1000.0000 mg | Freq: Once | INTRAVENOUS | Status: AC
  Administered 2023-10-05: 1000 mg via INTRAVENOUS

## 2023-10-04 MED ORDER — VANCOMYCIN HCL 1500 MG/300ML IV SOLN
1500.0000 mg | Freq: Once | INTRAVENOUS | Status: AC
  Administered 2023-10-05: 1500 mg via INTRAVENOUS
  Filled 2023-10-04: qty 300

## 2023-10-04 MED ORDER — SODIUM CHLORIDE 0.9 % IV BOLUS (SEPSIS)
1000.0000 mL | Freq: Once | INTRAVENOUS | Status: AC
Start: 2023-10-05 — End: 2023-10-05
  Administered 2023-10-05: 1000 mL via INTRAVENOUS

## 2023-10-04 MED ORDER — ONDANSETRON HCL 4 MG/2ML IJ SOLN
4.0000 mg | Freq: Once | INTRAMUSCULAR | Status: AC
  Administered 2023-10-05: 4 mg via INTRAVENOUS
  Filled 2023-10-04: qty 2

## 2023-10-04 MED ORDER — MORPHINE SULFATE (PF) 4 MG/ML IV SOLN
4.0000 mg | Freq: Once | INTRAVENOUS | Status: AC
  Administered 2023-10-05: 4 mg via INTRAVENOUS
  Filled 2023-10-04: qty 1

## 2023-10-04 MED ORDER — SODIUM CHLORIDE 0.9 % IV SOLN
2.0000 g | Freq: Once | INTRAVENOUS | Status: AC
  Administered 2023-10-05: 2 g via INTRAVENOUS
  Filled 2023-10-04: qty 12.5

## 2023-10-04 NOTE — ED Triage Notes (Signed)
First Nurse Note;  Pt via [facility] from home. Pt had surgery cyst on her groin in Nov of 2023. States she has some bleeding from that area today when she went to the bathroom. Suprapubic pain also. Pt is A&Ox4 and NAD 140/96 96% on RA 100 HR  148 CBG

## 2023-10-04 NOTE — ED Provider Notes (Signed)
[facility] Provider Note    Event Date/Time   First MD Initiated Contact with Patient 10/04/23 2346     (approximate)   History   Vaginal Pain   HPI  Tracy Woods is a 52 y.o. female with history of obesity, diabetes, hypertension, hyperlipidemia who presents to the emergency department with concerns for an abscess to the right labia.  States she noticed it in the shower.  It has been painful for the past 4 days and she has noticed some drainage.  No fever.  Reports she is not on any medication for her diabetes.  She reports she did have a prolonged hospitalization at [facility] requiring surgery for similar presentation and was diagnosed with necrotizing fasciitis.   History provided by patient.    Past Medical History:  Diagnosis Date   Anxiety and depression 05/10/2023   Candidiasis 06/26/2023   Diabetes mellitus without complication (HCC)    Elevated cholesterol 06/26/2023   History of urinary tract infection 11/18/2022   Hypertension    Morbid obesity (HCC) 05/10/2023   Necrotizing fasciitis (HCC) 10/24/2022   Neuropathy 05/10/2023   Other chronic pain 05/30/2023    Past Surgical History:  Procedure Laterality Date   COLONOSCOPY WITH PROPOFOL N/A 09/02/2023   Procedure: COLONOSCOPY WITH PROPOFOL;  Surgeon: Wyline Mood, MD;  Location: [facility] ENDOSCOPY;  Service: Gastroenterology;  Laterality: N/A;   ESOPHAGOGASTRODUODENOSCOPY (EGD) WITH PROPOFOL N/A 09/02/2023   Procedure: ESOPHAGOGASTRODUODENOSCOPY (EGD) WITH PROPOFOL;  Surgeon: Wyline Mood, MD;  Location: [facility] ENDOSCOPY;  Service: Gastroenterology;  Laterality: N/A;   INCISION AND DRAINAGE OF WOUND Bilateral 09/15/2022   Procedure: IRRIGATION AND DEBRIDEMENT WOUND;  Surgeon: Carolan Shiver, MD;  Location: [facility] ORS;  Service: General;  Laterality: Bilateral;   POLYPECTOMY  09/02/2023   Procedure: POLYPECTOMY;  Surgeon: Wyline Mood, MD;  Location: [facility] ENDOSCOPY;  Service:  Gastroenterology;;   TONSILLECTOMY      MEDICATIONS:  Prior to Admission medications   Medication Sig Start Date End Date Taking? Authorizing Provider  gabapentin (NEURONTIN) 300 MG capsule Take 1 capsule (300 mg total) by mouth 3 (three) times daily. 04/29/23   Kara Dies, NP  hydrOXYzine (VISTARIL) 50 MG capsule TAKE 1 CAPSULE BY MOUTH THREE TIMES DAILY AS NEEDED 08/20/23   Ostwalt, Edmon Crape, PA-C  mupirocin ointment (BACTROBAN) 2 % Apply 1 Application topically 2 (two) times daily. 08/01/23   Felecia Shelling, DPM  nitrofurantoin, macrocrystal-monohydrate, (MACROBID) 100 MG capsule Take 1 capsule (100 mg total) by mouth 2 (two) times daily. Patient not taking: Reported on 09/02/2023 08/04/23   Debera Lat, PA-C  pantoprazole (PROTONIX) 40 MG tablet Take 1 tablet (40 mg total) by mouth daily. 07/16/23 10/14/23  Celso Amy, PA-C    Physical Exam   Triage Vital Signs: ED Triage Vitals  Encounter Vitals Group     BP 10/04/23 1902 (!) 171/67     Systolic BP Percentile --      Diastolic BP Percentile --      Pulse Rate 10/04/23 1902 (!) 103     Resp 10/04/23 1902 17     Temp 10/04/23 1902 98.3 F (36.8 C)     Temp Source 10/04/23 1902 Oral     SpO2 10/04/23 1902 96 %     Weight 10/04/23 1904 (!) 350 lb (158.8 kg)     Height 10/04/23 1904 5\' 6"  (1.676 m)     Head Circumference --      Peak Flow --      Pain Score  10/04/23 1903 0     Pain Loc --      Pain Education --      Exclude from Growth Chart --     Most recent vital signs: Vitals:   10/05/23 0142 10/05/23 0200  BP: (!) 146/66 94/75  Pulse: 74 83  Resp: 13 15  Temp: 98.2 F (36.8 C) 98.4 F (36.9 C)  SpO2: 99% 100%    CONSTITUTIONAL: Alert, responds appropriately to questions.  Chronically ill-appearing HEAD: Normocephalic, atraumatic EYES: Conjunctivae clear, pupils appear equal, sclera nonicteric ENT: normal nose; moist mucous membranes NECK: Supple, normal ROM CARD: RRR; S1 and S2 appreciated RESP: Normal  chest excursion without splinting or tachypnea; breath sounds clear and equal bilaterally; no wheezes, no rhonchi, no rales, no hypoxia or respiratory distress, speaking full sentences ABD/GI: Non-distended; soft, non-tender, no rebound, no guarding, no peritoneal signs, extremely large pannus that is erythematous but not warm, mild induration noted.  Patient has a reducible umbilical hernia with no overlying skin changes. GU: Patient has a tender, erythematous, warm, fluctuant area to the right labia and inguinal area with no appreciable crepitus but exam is very limited due to patient's body habitus BACK: The back appears normal EXT: Normal ROM in all joints; no deformity noted, no edema SKIN: Normal color for age and race; warm; no rash on exposed skin NEURO: Moves all extremities equally, normal speech PSYCH: The patient's mood and manner are appropriate.   ED Results / Procedures / Treatments   LABS: (all labs ordered are listed, but only abnormal results are displayed) Labs Reviewed  COMPREHENSIVE METABOLIC PANEL - Abnormal; Notable for the following components:      Result Value   Glucose, Bld 134 (*)    BUN 23 (*)    Total Protein 8.4 (*)    Albumin 3.4 (*)    Total Bilirubin 1.3 (*)    All other components within normal limits  CULTURE, BLOOD (ROUTINE X 2)  CULTURE, BLOOD (ROUTINE X 2)  CBC WITH DIFFERENTIAL/PLATELET  LACTIC ACID, PLASMA  URINALYSIS, W/ REFLEX TO CULTURE (INFECTION SUSPECTED)  CBC  CREATININE, SERUM  HIV ANTIBODY (ROUTINE TESTING W REFLEX)     EKG:  EKG Interpretation Date/Time:    Ventricular Rate:    PR Interval:    QRS Duration:    QT Interval:    QTC Calculation:   R Axis:      Text Interpretation:           RADIOLOGY: My personal review and interpretation of imaging: CT shows cellulitis.  No fluid collection or gas.  I have personally reviewed all radiology reports.   CT PELVIS W CONTRAST  Result Date: 10/05/2023 CLINICAL DATA:   History of right labial abscess. EXAM: CT PELVIS WITH CONTRAST TECHNIQUE: Multidetector CT imaging of the pelvis was performed using the standard protocol following the bolus administration of intravenous contrast. RADIATION DOSE REDUCTION: This exam was performed according to the departmental dose-optimization program which includes automated exposure control, adjustment of the mA and/or kV according to patient size and/or use of iterative reconstruction technique. CONTRAST:  OMNIPAQUE IOHEXOL 350 MG/ML SOLN COMPARISON:  CT abdomen and pelvis 05/29/2023 FINDINGS: Urinary Tract:  No abnormality visualized. Bowel:  Unremarkable visualized pelvic bowel loops. Vascular/Lymphatic: No pathologically enlarged lymph nodes. No significant vascular abnormality seen. Reproductive:  No mass or other significant abnormality Other: There is a large midline ventral hernia containing nondilated bowel similar to the prior study. There is diffuse subcutaneous edema with some since  skin thickening in the lower aspect of the pannus. There is no evidence for soft tissue gas or focal fluid collection. Musculoskeletal: No suspicious bone lesions identified. IMPRESSION: 1. Diffuse subcutaneous edema with skin thickening in the lower aspect of the pannus. Findings may be related to cellulitis. No evidence for soft tissue gas or focal fluid collection. 2. Large midline ventral hernia containing nondilated bowel. Electronically Signed   By: Darliss Cheney M.D.   On: 10/05/2023 01:14     PROCEDURES:  Critical Care performed: No     .1-3 Lead EKG Interpretation  Performed by: Chestina Komatsu, Layla Maw, DO Authorized by: Markey Deady, Layla Maw, DO     Interpretation: normal     ECG rate:  88   ECG rate assessment: normal     Rhythm: sinus rhythm     Ectopy: none     Conduction: normal       IMPRESSION / MDM / ASSESSMENT AND PLAN / ED COURSE  I reviewed the triage vital signs and the nursing notes.    Patient here with labial  abscess, cellulitis.  History of necrotizing fasciitis requiring surgical intervention with prolonged hospitalization at [facility] in November 2023.  The patient is on the cardiac monitor to evaluate for evidence of arrhythmia and/or significant heart rate changes.   DIFFERENTIAL DIAGNOSIS (includes but not limited to):   Abscess, cellulitis, necrotizing fasciitis, sepsis   Patient's presentation is most consistent with acute presentation with potential threat to life or bodily function.   PLAN: Will obtain labs, CT of the pelvis with IV contrast.  Will give broad-spectrum antibiotics, IV fluids, pain medication.   MEDICATIONS GIVEN IN ED: Medications  vancomycin (VANCOCIN) IVPB 1000 mg/200 mL premix (0 mg Intravenous Stopped 10/05/23 0223)    Followed by  vancomycin (VANCOREADY) IVPB 1500 mg/300 mL (has no administration in time range)  gabapentin (NEURONTIN) capsule 300 mg (has no administration in time range)  pantoprazole (PROTONIX) EC tablet 40 mg (has no administration in time range)  enoxaparin (LOVENOX) injection 80 mg (has no administration in time range)  acetaminophen (TYLENOL) tablet 650 mg (has no administration in time range)    Or  acetaminophen (TYLENOL) suppository 650 mg (has no administration in time range)  ondansetron (ZOFRAN) tablet 4 mg (has no administration in time range)    Or  ondansetron (ZOFRAN) injection 4 mg (has no administration in time range)  insulin aspart (novoLOG) injection 0-20 Units (has no administration in time range)  insulin aspart (novoLOG) injection 0-5 Units (has no administration in time range)  HYDROcodone-acetaminophen (NORCO/VICODIN) 5-325 MG per tablet 1-2 tablet (has no administration in time range)  ketorolac (TORADOL) 30 MG/ML injection 30 mg (has no administration in time range)  ceFEPIme (MAXIPIME) 2 g in sodium chloride 0.9 % 100 mL IVPB (has no administration in time range)  vancomycin (VANCOREADY) IVPB 2000 mg/400 mL (has no  administration in time range)    And  vancomycin (VANCOREADY) IVPB 500 mg/100 mL (has no administration in time range)  sodium chloride 0.9 % bolus 1,000 mL (0 mLs Intravenous Stopped 10/05/23 0058)  ceFEPIme (MAXIPIME) 2 g in sodium chloride 0.9 % 100 mL IVPB (0 g Intravenous Stopped 10/05/23 0036)  morphine (PF) 4 MG/ML injection 4 mg (4 mg Intravenous Given 10/05/23 0010)  ondansetron (ZOFRAN) injection 4 mg (4 mg Intravenous Given 10/05/23 0010)  iohexol (OMNIPAQUE) 350 MG/ML injection 100 mL (100 mLs Intravenous Contrast Given 10/05/23 0020)     ED COURSE: Patient's labs show no leukocytosis.  Blood sugar 103.  Normal lactic.  CT scan reviewed and interpreted by myself and the radiologist and shows cellulitis but no fluid collection or gas.  Given her history of necrotizing fasciitis and diabetes, will admit for close observation and IV antibiotics.  Patient comfortable with this plan.   CONSULTS:  Consulted and discussed patient's case with hospitalist, Dr. Para March.  I have recommended admission and consulting physician agrees and will place admission orders.  Patient (and family if present) agree with this plan.   I reviewed all nursing notes, vitals, pertinent previous records.  All labs, EKGs, imaging ordered have been independently reviewed and interpreted by myself.    OUTSIDE RECORDS REVIEWED: Reviewed previous admission notes at [facility] in November 2023.       FINAL CLINICAL IMPRESSION(S) / ED DIAGNOSES   Final diagnoses:  Cellulitis of abdominal wall     Rx / DC Orders   ED Discharge Orders     None        Note:  This document was prepared using Dragon voice recognition software and may include unintentional dictation errors.   Charo Philipp, Layla Maw, DO 10/05/23 [phone]

## 2023-10-04 NOTE — Progress Notes (Signed)
ED Pharmacy Antibiotic Sign Off An antibiotic consult was received from an ED provider for Vancomycin per pharmacy dosing for cellulitis. A chart review was completed to assess appropriateness.   The following one time order(s) were placed:  Vancomycin 2500 mg IV X 1   Further antibiotic and/or antibiotic pharmacy consults should be ordered by the admitting provider if indicated.   Thank you for allowing pharmacy to be a part of this patient's care.   Scherrie Gerlach [facility]  Clinical Pharmacist 10/04/23 11:57 PM

## 2023-10-04 NOTE — ED Triage Notes (Signed)
Pt believes she has a cyst right above her vaginal opening. Pt states she felt it when showering this morning; did have some bleeding from it today.

## 2023-10-05 ENCOUNTER — Encounter: Payer: Self-pay | Admitting: Internal Medicine

## 2023-10-05 ENCOUNTER — Emergency Department: Payer: Medicaid Other

## 2023-10-05 ENCOUNTER — Other Ambulatory Visit: Payer: Self-pay

## 2023-10-05 DIAGNOSIS — I10 Essential (primary) hypertension: Secondary | ICD-10-CM | POA: Diagnosis present

## 2023-10-05 DIAGNOSIS — F32A Depression, unspecified: Secondary | ICD-10-CM

## 2023-10-05 DIAGNOSIS — Z5986 Financial insecurity: Secondary | ICD-10-CM | POA: Diagnosis not present

## 2023-10-05 DIAGNOSIS — F419 Anxiety disorder, unspecified: Secondary | ICD-10-CM

## 2023-10-05 DIAGNOSIS — L03311 Cellulitis of abdominal wall: Secondary | ICD-10-CM | POA: Diagnosis present

## 2023-10-05 DIAGNOSIS — R609 Edema, unspecified: Secondary | ICD-10-CM | POA: Diagnosis present

## 2023-10-05 DIAGNOSIS — Z880 Allergy status to penicillin: Secondary | ICD-10-CM | POA: Diagnosis not present

## 2023-10-05 DIAGNOSIS — Z8739 Personal history of other diseases of the musculoskeletal system and connective tissue: Secondary | ICD-10-CM

## 2023-10-05 DIAGNOSIS — K439 Ventral hernia without obstruction or gangrene: Secondary | ICD-10-CM | POA: Insufficient documentation

## 2023-10-05 DIAGNOSIS — Z79899 Other long term (current) drug therapy: Secondary | ICD-10-CM | POA: Diagnosis not present

## 2023-10-05 DIAGNOSIS — Z83438 Family history of other disorder of lipoprotein metabolism and other lipidemia: Secondary | ICD-10-CM | POA: Diagnosis not present

## 2023-10-05 DIAGNOSIS — E1169 Type 2 diabetes mellitus with other specified complication: Secondary | ICD-10-CM | POA: Diagnosis present

## 2023-10-05 DIAGNOSIS — Z87891 Personal history of nicotine dependence: Secondary | ICD-10-CM | POA: Diagnosis not present

## 2023-10-05 DIAGNOSIS — Z8249 Family history of ischemic heart disease and other diseases of the circulatory system: Secondary | ICD-10-CM | POA: Diagnosis not present

## 2023-10-05 DIAGNOSIS — Z822 Family history of deafness and hearing loss: Secondary | ICD-10-CM | POA: Diagnosis not present

## 2023-10-05 DIAGNOSIS — Z6841 Body Mass Index (BMI) 40.0 and over, adult: Secondary | ICD-10-CM

## 2023-10-05 DIAGNOSIS — Z833 Family history of diabetes mellitus: Secondary | ICD-10-CM | POA: Diagnosis not present

## 2023-10-05 DIAGNOSIS — E7800 Pure hypercholesterolemia, unspecified: Secondary | ICD-10-CM | POA: Diagnosis present

## 2023-10-05 DIAGNOSIS — L02215 Cutaneous abscess of perineum: Secondary | ICD-10-CM | POA: Diagnosis present

## 2023-10-05 DIAGNOSIS — S31105A Unspecified open wound of abdominal wall, periumbilic region without penetration into peritoneal cavity, initial encounter: Secondary | ICD-10-CM | POA: Diagnosis not present

## 2023-10-05 DIAGNOSIS — Z823 Family history of stroke: Secondary | ICD-10-CM | POA: Diagnosis not present

## 2023-10-05 DIAGNOSIS — L304 Erythema intertrigo: Secondary | ICD-10-CM | POA: Diagnosis present

## 2023-10-05 LAB — LACTIC ACID, PLASMA: Lactic Acid, Venous: 1 mmol/L (ref 0.5–1.9)

## 2023-10-05 LAB — GLUCOSE, CAPILLARY
Glucose-Capillary: 124 mg/dL — ABNORMAL HIGH (ref 70–99)
Glucose-Capillary: 119 mg/dL — ABNORMAL HIGH (ref 70–99)
Glucose-Capillary: 116 mg/dL — ABNORMAL HIGH (ref 70–99)
Glucose-Capillary: 89 mg/dL (ref 70–99)

## 2023-10-05 MED ORDER — HYDROXYZINE HCL 25 MG PO TABS
50.0000 mg | ORAL_TABLET | Freq: Three times a day (TID) | ORAL | Status: DC | PRN
  Administered 2023-10-05 – 2023-10-08 (×9): 50 mg via ORAL
  Filled 2023-10-05 (×10): qty 2

## 2023-10-05 MED ORDER — IOHEXOL 350 MG/ML SOLN
100.0000 mL | Freq: Once | INTRAVENOUS | Status: AC | PRN
  Administered 2023-10-05: 100 mL via INTRAVENOUS

## 2023-10-05 MED ORDER — HYDROCODONE-ACETAMINOPHEN 5-325 MG PO TABS
1.0000 | ORAL_TABLET | ORAL | Status: DC | PRN
  Administered 2023-10-05 (×2): 2 via ORAL
  Administered 2023-10-05: 1 via ORAL
  Administered 2023-10-06 – 2023-10-08 (×9): 2 via ORAL
  Administered 2023-10-08: 1 via ORAL
  Filled 2023-10-05 (×15): qty 2

## 2023-10-05 MED ORDER — VANCOMYCIN HCL 2000 MG/400ML IV SOLN
2000.0000 mg | INTRAVENOUS | Status: DC
  Administered 2023-10-06 – 2023-10-08 (×3): 2000 mg via INTRAVENOUS
  Filled 2023-10-05 (×3): qty 400

## 2023-10-05 MED ORDER — ACETAMINOPHEN 650 MG RE SUPP: 650.0000 mg | Freq: Four times a day (QID) | RECTAL | Status: DC | PRN

## 2023-10-05 MED ORDER — TRIAMCINOLONE ACETONIDE 0.1 % EX CREA
TOPICAL_CREAM | Freq: Two times a day (BID) | CUTANEOUS | Status: DC
  Filled 2023-10-05: qty 15

## 2023-10-05 MED ORDER — PANTOPRAZOLE SODIUM 40 MG PO TBEC
40.0000 mg | DELAYED_RELEASE_TABLET | Freq: Every day | ORAL | Status: DC
  Administered 2023-10-05 – 2023-10-08 (×4): 40 mg via ORAL
  Filled 2023-10-05 (×4): qty 1

## 2023-10-05 MED ORDER — ONDANSETRON HCL 4 MG PO TABS: 4.0000 mg | ORAL_TABLET | Freq: Four times a day (QID) | ORAL | Status: DC | PRN

## 2023-10-05 MED ORDER — ONDANSETRON HCL 4 MG/2ML IJ SOLN
4.0000 mg | Freq: Four times a day (QID) | INTRAMUSCULAR | Status: DC | PRN
  Administered 2023-10-06: 4 mg via INTRAVENOUS
  Filled 2023-10-05: qty 2

## 2023-10-05 MED ORDER — ENOXAPARIN SODIUM 80 MG/0.8ML IJ SOSY
0.5000 mg/kg | PREFILLED_SYRINGE | INTRAMUSCULAR | Status: DC
  Administered 2023-10-05 – 2023-10-08 (×4): 80 mg via SUBCUTANEOUS
  Filled 2023-10-05 (×4): qty 0.8

## 2023-10-05 MED ORDER — SALINE SPRAY 0.65 % NA SOLN
1.0000 | NASAL | Status: DC | PRN
  Administered 2023-10-05: 1 via NASAL
  Filled 2023-10-05: qty 44

## 2023-10-05 MED ORDER — INSULIN ASPART 100 UNIT/ML IJ SOLN
0.0000 [IU] | Freq: Three times a day (TID) | INTRAMUSCULAR | Status: DC
  Administered 2023-10-05 – 2023-10-08 (×4): 3 [IU] via SUBCUTANEOUS
  Filled 2023-10-05 (×4): qty 1

## 2023-10-05 MED ORDER — ACETAMINOPHEN 325 MG PO TABS: 650.0000 mg | ORAL_TABLET | Freq: Four times a day (QID) | ORAL | Status: DC | PRN

## 2023-10-05 MED ORDER — KETOROLAC TROMETHAMINE 30 MG/ML IJ SOLN: 30.0000 mg | Freq: Four times a day (QID) | INTRAMUSCULAR | Status: DC | PRN

## 2023-10-05 MED ORDER — SODIUM CHLORIDE 0.9 % IV SOLN
2.0000 g | Freq: Three times a day (TID) | INTRAVENOUS | Status: DC
  Administered 2023-10-05 – 2023-10-08 (×10): 2 g via INTRAVENOUS
  Filled 2023-10-05 (×10): qty 12.5

## 2023-10-05 MED ORDER — INSULIN ASPART 100 UNIT/ML IJ SOLN: 0.0000 [IU] | Freq: Every day | INTRAMUSCULAR | Status: DC

## 2023-10-05 MED ORDER — VANCOMYCIN HCL 500 MG/100ML IV SOLN
500.0000 mg | INTRAVENOUS | Status: DC
  Administered 2023-10-06 – 2023-10-08 (×3): 500 mg via INTRAVENOUS
  Filled 2023-10-05 (×3): qty 100

## 2023-10-05 MED ORDER — GABAPENTIN 300 MG PO CAPS
300.0000 mg | ORAL_CAPSULE | Freq: Three times a day (TID) | ORAL | Status: DC
  Administered 2023-10-05 – 2023-10-08 (×10): 300 mg via ORAL
  Filled 2023-10-05 (×10): qty 1

## 2023-10-05 NOTE — Assessment & Plan Note (Addendum)
History of necrotizing fasciitis abdominal wall November 2023 CT abdomen and pelvis showing diffuse subcutaneous edema lower aspect of pannus with no focal fluid collection or soft tissue gas Will treat aggressively with vancomycin and cefepime given past history Pain control, symptom control Surgical consult if worsening Wound care consult as patient mention she like she had some oozing in the area of the mons pubis

## 2023-10-05 NOTE — Progress Notes (Signed)
Pharmacy Antibiotic Note  Tracy Woods is a 52 y.o. female admitted on 10/04/2023 with cellulitis.  Pharmacy has been consulted for Cefepime, Vancomycin dosing.  Plan: Cefepime 2 gm IV X 1 given in ED on 11/24 @ 0010. Cefepime 2 gm IV Q8H ordered to start on 11/24 @ 0800.  Vancomycin 2500 mg IV X 1 given in ED on 11/24 @ 0102. Vancomycin 2500 mg IV Q24H ordered to start on 11/25 @ 0100.   AUC = 465.8 Vanc trough = 9.2   Height: 5\' 6"  (167.6 cm) Weight: (!) 158.8 kg (350 lb) IBW/kg (Calculated) : 59.3  Temp (24hrs), Avg:98.4 F (36.9 C), Min:98.2 F (36.8 C), Max:98.7 F (37.1 C)  Recent Labs  Lab 10/04/23 1908 10/05/23 0003  WBC 10.0  --   CREATININE 0.61  --   LATICACIDVEN  --  1.0    Estimated Creatinine Clearance: 127.2 mL/min (by C-G formula based on SCr of 0.61 mg/dL).    Allergies  Allergen Reactions   Amoxicillin Rash    Tolerated Zosyn 09/16/22 with no apparent issue    Antimicrobials this admission:   >>    >>   Dose adjustments this admission:   Microbiology results:  BCx:   UCx:    Sputum:    MRSA PCR:   Thank you for allowing pharmacy to be a part of this patient's care.  Raylinn Kosar D 10/05/2023 2:35 AM

## 2023-10-05 NOTE — Progress Notes (Signed)
Anticoagulation monitoring(Lovenox):  52 yo female ordered Lovenox 40 mg Q24h    Filed Weights   10/04/23 1904  Weight: (!) 158.8 kg (350 lb)   BMI 56.5   Lab Results  Component Value Date   CREATININE 0.61 10/04/2023   CREATININE 0.76 06/26/2023   CREATININE 0.62 05/02/2023   Estimated Creatinine Clearance: 127.2 mL/min (by C-G formula based on SCr of 0.61 mg/dL). Hemoglobin & Hematocrit     Component Value Date/Time   HGB 14.6 10/04/2023 1908   HGB 13.4 06/26/2023 1002   HCT 41.3 10/04/2023 1908   HCT 42.0 06/26/2023 1002     Per Protocol for Patient with estCrcl > 30 ml/min and BMI > 30, will transition to Lovenox 80 mg Q24h.

## 2023-10-05 NOTE — Assessment & Plan Note (Signed)
No acute issues.

## 2023-10-05 NOTE — H&P (Signed)
History and Physical    Patient: Tracy Woods ZOX:[id number] DOB: 07-Mar-1971 DOA: 10/04/2023 DOS: the patient was seen and examined on 10/05/2023 PCP: Debera Lat, PA-C  Patient coming from: Home  Chief Complaint:  Chief Complaint  Patient presents with   Vaginal Pain    HPI: Tracy Woods is a 52 y.o. female with medical history significant for Morbid obesity, type 2 diabetes, history of 6-week hospitalization a year ago 11-10/2022 at [facility] for Pseudomonas necrotizing fasciitis right thigh and lower abdominal wall, who presents to the ED with pain and swelling in the mons pubis and lower abdominal wall.  She denies fever or chills. ED course and data review: Afebrile, BP 171/67 with pulse 103 improving to 74 with fluids Labs notable for normal CBC, unremarkable CMP.  Lactic acid 1.0. CT abdomen and pelvis showing diffuse subcutaneous edema lower aspect of pannus as detailed below: IMPRESSION: 1. Diffuse subcutaneous edema with skin thickening in the lower aspect of the pannus. Findings may be related to cellulitis. No evidence for soft tissue gas or focal fluid collection. 2. Large midline ventral hernia containing nondilated bowel.  Patient given a fluid bolus started on cefepime and vancomycin Treated with morphine and Zofran Hospitalist consulted for admission.   Review of Systems: As mentioned in the history of present illness. All other systems reviewed and are negative.  Past Medical History:  Diagnosis Date   Anxiety and depression 05/10/2023   Candidiasis 06/26/2023   Diabetes mellitus without complication (HCC)    Elevated cholesterol 06/26/2023   History of urinary tract infection 11/18/2022   Hypertension    Morbid obesity (HCC) 05/10/2023   Necrotizing fasciitis (HCC) 10/24/2022   Neuropathy 05/10/2023   Other chronic pain 05/30/2023   Past Surgical History:  Procedure Laterality Date   COLONOSCOPY WITH PROPOFOL N/A 09/02/2023   Procedure:  COLONOSCOPY WITH PROPOFOL;  Surgeon: Wyline Mood, MD;  Location: [facility] ENDOSCOPY;  Service: Gastroenterology;  Laterality: N/A;   ESOPHAGOGASTRODUODENOSCOPY (EGD) WITH PROPOFOL N/A 09/02/2023   Procedure: ESOPHAGOGASTRODUODENOSCOPY (EGD) WITH PROPOFOL;  Surgeon: Wyline Mood, MD;  Location: [facility] ENDOSCOPY;  Service: Gastroenterology;  Laterality: N/A;   INCISION AND DRAINAGE OF WOUND Bilateral 09/15/2022   Procedure: IRRIGATION AND DEBRIDEMENT WOUND;  Surgeon: Carolan Shiver, MD;  Location: [facility] ORS;  Service: General;  Laterality: Bilateral;   POLYPECTOMY  09/02/2023   Procedure: POLYPECTOMY;  Surgeon: Wyline Mood, MD;  Location: [facility] ENDOSCOPY;  Service: Gastroenterology;;   TONSILLECTOMY     Social History:  reports that she has quit smoking. Her smoking use included cigarettes. She has never used smokeless tobacco. She reports that she does not currently use alcohol. She reports that she does not currently use drugs.  Allergies  Allergen Reactions   Amoxicillin Rash    Tolerated Zosyn 09/16/22 with no apparent issue    Family History  Problem Relation Age of Onset   Stroke Mother    Miscarriages / [place] Mother    Hyperlipidemia Mother    Hypertension Mother    Heart disease Mother    Hearing loss Mother    Diabetes Mother     Prior to Admission medications   Medication Sig Start Date End Date Taking? Authorizing Provider  gabapentin (NEURONTIN) 300 MG capsule Take 1 capsule (300 mg total) by mouth 3 (three) times daily. 04/29/23   Kara Dies, NP  hydrOXYzine (VISTARIL) 50 MG capsule TAKE 1 CAPSULE BY MOUTH THREE TIMES DAILY AS NEEDED 08/20/23   Ostwalt, Edmon Crape, PA-C  mupirocin ointment (BACTROBAN) 2 % Apply  1 Application topically 2 (two) times daily. 08/01/23   Felecia Shelling, DPM  nitrofurantoin, macrocrystal-monohydrate, (MACROBID) 100 MG capsule Take 1 capsule (100 mg total) by mouth 2 (two) times daily. Patient not taking: Reported on 09/02/2023 08/04/23    Debera Lat, PA-C  pantoprazole (PROTONIX) 40 MG tablet Take 1 tablet (40 mg total) by mouth daily. 07/16/23 10/14/23  Celso Amy, PA-C    Physical Exam: Vitals:   10/04/23 1902 10/04/23 1904 10/04/23 2225 10/05/23 0142  BP: (!) 171/67  101/70 (!) 146/66  Pulse: (!) 103  88 74  Resp: 17  18 13   Temp: 98.3 F (36.8 C)  98.7 F (37.1 C) 98.2 F (36.8 C)  TempSrc: Oral  Oral Oral  SpO2: 96%  91% 99%  Weight:  (!) 158.8 kg    Height:  5\' 6"  (1.676 m)     Physical Exam Vitals and nursing note reviewed.  Constitutional:      General: She is not in acute distress.    Appearance: She is morbidly obese.  HENT:     Head: Normocephalic and atraumatic.  Cardiovascular:     Rate and Rhythm: Regular rhythm. Tachycardia present.     Heart sounds: Normal heart sounds.  Pulmonary:     Effort: Pulmonary effort is normal.     Breath sounds: Normal breath sounds.  Abdominal:     Palpations: Abdomen is soft.     Tenderness: There is no abdominal tenderness.  Neurological:     Mental Status: Mental status is at baseline.     Labs on Admission: I have personally reviewed following labs and imaging studies  CBC: Recent Labs  Lab 10/04/23 1908  WBC 10.0  NEUTROABS 7.4  HGB 14.6  HCT 41.3  MCV 81.9  PLT 272   Basic Metabolic Panel: Recent Labs  Lab 10/04/23 1908  NA 135  K 4.0  CL 102  CO2 23  GLUCOSE 134*  BUN 23*  CREATININE 0.61  CALCIUM 9.0   GFR: Estimated Creatinine Clearance: 127.2 mL/min (by C-G formula based on SCr of 0.61 mg/dL). Liver Function Tests: Recent Labs  Lab 10/04/23 1908  AST 18  ALT 18  ALKPHOS 116  BILITOT 1.3*  PROT 8.4*  ALBUMIN 3.4*   No results for input(s): "LIPASE", "AMYLASE" in the last 168 hours. No results for input(s): "AMMONIA" in the last 168 hours. Coagulation Profile: No results for input(s): "INR", "PROTIME" in the last 168 hours. Cardiac Enzymes: No results for input(s): "CKTOTAL", "CKMB", "CKMBINDEX", "TROPONINI" in  the last 168 hours. BNP (last 3 results) No results for input(s): "PROBNP" in the last 8760 hours. HbA1C: No results for input(s): "HGBA1C" in the last 72 hours. CBG: No results for input(s): "GLUCAP" in the last 168 hours. Lipid Profile: No results for input(s): "CHOL", "HDL", "LDLCALC", "TRIG", "CHOLHDL", "LDLDIRECT" in the last 72 hours. Thyroid Function Tests: No results for input(s): "TSH", "T4TOTAL", "FREET4", "T3FREE", "THYROIDAB" in the last 72 hours. Anemia Panel: No results for input(s): "VITAMINB12", "FOLATE", "FERRITIN", "TIBC", "IRON", "RETICCTPCT" in the last 72 hours. Urine analysis:    Component Value Date/Time   COLORURINE YELLOW (A) 05/02/2023 1133   APPEARANCEUR TURBID (A) 05/02/2023 1133   LABSPEC 1.021 05/02/2023 1133   PHURINE 5.0 05/02/2023 1133   GLUCOSEU NEGATIVE 05/02/2023 1133   HGBUR LARGE (A) 05/02/2023 1133   BILIRUBINUR negative 07/29/2023 1140   KETONESUR NEGATIVE 05/02/2023 1133   PROTEINUR Positive (A) 07/29/2023 1140   PROTEINUR 100 (A) 05/02/2023 1133   UROBILINOGEN  0.2 07/29/2023 1140   NITRITE negative 07/29/2023 1140   NITRITE NEGATIVE 05/02/2023 1133   LEUKOCYTESUR Moderate (2+) (A) 07/29/2023 1140   LEUKOCYTESUR MODERATE (A) 05/02/2023 1133    Radiological Exams on Admission: CT PELVIS W CONTRAST  Result Date: 10/05/2023 CLINICAL DATA:  History of right labial abscess. EXAM: CT PELVIS WITH CONTRAST TECHNIQUE: Multidetector CT imaging of the pelvis was performed using the standard protocol following the bolus administration of intravenous contrast. RADIATION DOSE REDUCTION: This exam was performed according to the departmental dose-optimization program which includes automated exposure control, adjustment of the mA and/or kV according to patient size and/or use of iterative reconstruction technique. CONTRAST:  OMNIPAQUE IOHEXOL 350 MG/ML SOLN COMPARISON:  CT abdomen and pelvis 05/29/2023 FINDINGS: Urinary Tract:  No abnormality  visualized. Bowel:  Unremarkable visualized pelvic bowel loops. Vascular/Lymphatic: No pathologically enlarged lymph nodes. No significant vascular abnormality seen. Reproductive:  No mass or other significant abnormality Other: There is a large midline ventral hernia containing nondilated bowel similar to the prior study. There is diffuse subcutaneous edema with some since skin thickening in the lower aspect of the pannus. There is no evidence for soft tissue gas or focal fluid collection. Musculoskeletal: No suspicious bone lesions identified. IMPRESSION: 1. Diffuse subcutaneous edema with skin thickening in the lower aspect of the pannus. Findings may be related to cellulitis. No evidence for soft tissue gas or focal fluid collection. 2. Large midline ventral hernia containing nondilated bowel. Electronically Signed   By: Darliss Cheney M.D.   On: 10/05/2023 01:14     Data Reviewed: Relevant notes from primary care and specialist visits, past discharge summaries as available in EHR, including Care Everywhere. Prior diagnostic testing as pertinent to current admission diagnoses Updated medications and problem lists for reconciliation ED course, including vitals, labs, imaging, treatment and response to treatment Triage notes, nursing and pharmacy notes and ED provider's notes Notable results as noted in HPI   Assessment and Plan: Abdominal wall cellulitis History of necrotizing fasciitis abdominal wall November 2023 CT abdomen and pelvis showing diffuse subcutaneous edema lower aspect of pannus with no focal fluid collection or soft tissue gas Will treat aggressively with vancomycin and cefepime given past history Pain control, symptom control Surgical consult if worsening Wound care consult as patient mention she like she had some oozing in the area of the mons pubis  Ventral hernia without obstruction or gangrene No acute issues  Anxiety and depression Continue hydroxyzine and  gabapentin  Type 2 diabetes mellitus with other specified complication (HCC) Sliding scale insulin coverage  Morbid obesity with BMI of 50.0-59.9, adult (HCC) Complicating factor to overall prognosis and care     DVT prophylaxis: Lovenox  Consults: none  Advance Care Planning:   Code Status: Prior   Family Communication: none  Disposition Plan: Back to previous home environment  Severity of Illness: The appropriate patient status for this patient is INPATIENT. Inpatient status is judged to be reasonable and necessary in order to provide the required intensity of service to ensure the patient's safety. The patient's presenting symptoms, physical exam findings, and initial radiographic and laboratory data in the context of their chronic comorbidities is felt to place them at high risk for further clinical deterioration. Furthermore, it is not anticipated that the patient will be medically stable for discharge from the hospital within 2 midnights of admission.   * I certify that at the point of admission it is my clinical judgment that the patient will require inpatient hospital  care spanning beyond 2 midnights from the point of admission due to high intensity of service, high risk for further deterioration and high frequency of surveillance required.*  Author: Andris Baumann, MD 10/05/2023 2:11 AM  For on call review [email or web address].

## 2023-10-05 NOTE — ED Notes (Signed)
Patient transported to CT 

## 2023-10-05 NOTE — Assessment & Plan Note (Signed)
Continue hydroxyzine and gabapentin

## 2023-10-05 NOTE — Plan of Care (Signed)
  Problem: Education: Goal: Ability to describe self-care measures that may prevent or decrease complications (Diabetes Survival Skills Education) will improve Outcome: Progressing Goal: Individualized Educational Video(s) Outcome: Progressing   Problem: Coping: Goal: Ability to adjust to condition or change in health will improve Outcome: Progressing   

## 2023-10-05 NOTE — Assessment & Plan Note (Deleted)
History of necrotizing fasciitis abdominal wall November 2023 CT abdomen and pelvis showing diffuse subcutaneous edema lower aspect of pannus with no focal fluid collection or soft tissue gas Will treat aggressively with vancomycin and cefepime given past history Pain control, symptom control Surgical consult if worsening

## 2023-10-05 NOTE — Assessment & Plan Note (Signed)
Complicating factor to overall prognosis and care 

## 2023-10-05 NOTE — Assessment & Plan Note (Signed)
Sliding scale insulin coverage 

## 2023-10-05 NOTE — Final Progress Note (Signed)
Same day rounding progress note  Patient seen and examined.  I agree with assessment and plan dictated by Dr. Para March in her history and physical.  Please see her H&P for further details.  Abdominal wall cellulitis Continue IV vancomycin and cefepime Wound care nurse consult  Time spent: 15 minutes

## 2023-10-06 ENCOUNTER — Other Ambulatory Visit: Payer: Self-pay | Admitting: Internal Medicine

## 2023-10-06 DIAGNOSIS — Z8739 Personal history of other diseases of the musculoskeletal system and connective tissue: Secondary | ICD-10-CM | POA: Diagnosis not present

## 2023-10-06 DIAGNOSIS — F419 Anxiety disorder, unspecified: Secondary | ICD-10-CM | POA: Diagnosis not present

## 2023-10-06 DIAGNOSIS — S31105A Unspecified open wound of abdominal wall, periumbilic region without penetration into peritoneal cavity, initial encounter: Secondary | ICD-10-CM | POA: Diagnosis not present

## 2023-10-06 DIAGNOSIS — L03311 Cellulitis of abdominal wall: Secondary | ICD-10-CM | POA: Diagnosis not present

## 2023-10-06 LAB — CBC
HCT: 37.3 % (ref 36.0–46.0)
Hemoglobin: 12.7 g/dL (ref 12.0–15.0)
MCH: 28.3 pg (ref 26.0–34.0)
MCHC: 34 g/dL (ref 30.0–36.0)
MCV: 83.1 fL (ref 80.0–100.0)
Platelets: 253 10*3/uL (ref 150–400)
RBC: 4.49 MIL/uL (ref 3.87–5.11)
RDW: 12.7 % (ref 11.5–15.5)
WBC: 9.7 10*3/uL (ref 4.0–10.5)
nRBC: 0 % (ref 0.0–0.2)

## 2023-10-06 LAB — GLUCOSE, CAPILLARY
Glucose-Capillary: 95 mg/dL (ref 70–99)
Glucose-Capillary: 121 mg/dL — ABNORMAL HIGH (ref 70–99)
Glucose-Capillary: 118 mg/dL — ABNORMAL HIGH (ref 70–99)
Glucose-Capillary: 98 mg/dL (ref 70–99)

## 2023-10-06 LAB — HIV ANTIBODY (ROUTINE TESTING W REFLEX): HIV Screen 4th Generation wRfx: NONREACTIVE

## 2023-10-06 LAB — BASIC METABOLIC PANEL
Anion gap: 10 (ref 5–15)
BUN: 18 mg/dL (ref 6–20)
CO2: 21 mmol/L — ABNORMAL LOW (ref 22–32)
Calcium: 8.5 mg/dL — ABNORMAL LOW (ref 8.9–10.3)
Chloride: 103 mmol/L (ref 98–111)
Creatinine, Ser: 0.71 mg/dL (ref 0.44–1.00)
GFR, Estimated: >60 mL/min (ref >60)
Glucose, Bld: 127 mg/dL — ABNORMAL HIGH (ref 70–99)
Potassium: 3.6 mmol/L (ref 3.5–5.1)
Sodium: 134 mmol/L — ABNORMAL LOW (ref 135–145)

## 2023-10-06 MED ORDER — POLYETHYLENE GLYCOL 3350 17 G PO PACK
17.0000 g | PACK | Freq: Once | ORAL | Status: AC
  Administered 2023-10-06: 17 g via ORAL
  Filled 2023-10-06: qty 1

## 2023-10-06 MED ORDER — POLYETHYLENE GLYCOL 3350 17 G PO PACK
17.0000 g | PACK | Freq: Two times a day (BID) | ORAL | Status: DC
  Filled 2023-10-06 (×2): qty 1

## 2023-10-06 MED ORDER — MORPHINE SULFATE (PF) 4 MG/ML IV SOLN
4.0000 mg | Freq: Once | INTRAVENOUS | Status: AC
  Administered 2023-10-06: 4 mg via INTRAVENOUS
  Filled 2023-10-06: qty 1

## 2023-10-06 MED ORDER — SENNOSIDES-DOCUSATE SODIUM 8.6-50 MG PO TABS
2.0000 | ORAL_TABLET | Freq: Two times a day (BID) | ORAL | Status: DC
  Administered 2023-10-06 – 2023-10-08 (×4): 2 via ORAL
  Filled 2023-10-06 (×5): qty 2

## 2023-10-06 NOTE — Evaluation (Signed)
Occupational Therapy Evaluation Patient Details Name: Tracy Woods MRN: [id number] DOB: Oct 24, 1971 Today's Date: 10/06/2023   History of Present Illness 52 y.o. female who presents to the ED with pain and swelling in the mons pubis and lower abdominal wall with dx of abdominal wall cellulitis. 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   Clinical Impression   Pt was seen for OT evaluation this date. Prior to hospital admission, pt lives alone in a 2 level home, but reports living on the main level with access to bedroom/bathroom. She reports IND with mobility using no AD and ADL/simple IADL management independently. Her son lives nearby and does assist with higher level IADLs as needed.  Pt presents to acute OT demonstrating no functional decline in ADL performance or functional mobility 2/2 pain from hernia site and perineum. Reports 6/10 pain to these areas during transfers. She demo bed mobility MOD I, STS from EOB to RW with MOD I and mobility to the bathroom independently. Pt sat on toilet to perform toileting tasks and wash up with set up assist. Pt does not feel a need for acute OT services and reports she is at her baseline excluding the pain. Her son will be available post DC from hospital to assist as needed. OT to sign off in house with no further OT follow up recommended. Can re consult therapy if new needs arise during hospitalization.       If plan is discharge home, recommend the following:      Functional Status Assessment  Patient has not had a recent decline in their functional status  Equipment Recommendations  None recommended by OT    Recommendations for Other Services       Precautions / Restrictions Precautions Precautions: None Restrictions Weight Bearing Restrictions: No      Mobility Bed  Mobility Overal bed mobility: Modified Independent                  Transfers Overall transfer level: Modified independent Equipment used: Rolling walker (2 wheels)                      Balance Overall balance assessment: Modified Independent                                         ADL either performed or assessed with clinical judgement   ADL Overall ADL's : Independent                                       General ADL Comments: MOD I/IND with bed mobility, STS, ambulation and toilet transfer     Vision         Perception         Praxis         Pertinent Vitals/Pain Pain Assessment Pain Assessment: 0-10 Pain Score: 6  Pain Descriptors / Indicators: Aching, Sore, Tender Pain Intervention(s): Limited activity within patient's tolerance, Monitored during session     Extremity/Trunk Assessment Upper Extremity Assessment Upper Extremity Assessment: Overall WFL for tasks assessed   Lower Extremity Assessment Lower Extremity Assessment: Overall WFL for tasks assessed       Communication Communication  Communication: No apparent difficulties   Cognition Arousal: Alert Behavior During Therapy: WFL for tasks assessed/performed Overall Cognitive Status: Within Functional Limits for tasks assessed                                       General Comments  pt with wounds to hernia on abdomen and perineum-being addressed by MD/nurses and wound care to be consulted    Exercises Other Exercises Other Exercises: Edu on role of OT in acute setting and importance of continued mobility/activity to prevent functional decline.   Shoulder Instructions      Home Living Family/patient expects to be discharged to:: Private residence Living Arrangements: Alone Available Help at Discharge: Family (son lives closeby) Type of Home: House       Home Layout: Two level;Able to live on main level with  bedroom/bathroom     Bathroom Shower/Tub: Chief Strategy Officer: Standard     Home Equipment: Agricultural consultant (2 wheels);Tub bench;Grab bars - tub/shower          Prior Functioning/Environment Prior Level of Function : Independent/Modified Independent             Mobility Comments: IND with no AD use; has RW if needed; sleeps in recliner ADLs Comments: IND        OT Problem List:        OT Treatment/Interventions:      OT Goals(Current goals can be found in the care plan section)    OT Frequency:      Co-evaluation              AM-PAC OT "6 Clicks" Daily Activity     Outcome Measure Help from another person eating meals?: None Help from another person taking care of personal grooming?: None Help from another person toileting, which includes using toliet, bedpan, or urinal?: None Help from another person bathing (including washing, rinsing, drying)?: None Help from another person to put on and taking off regular upper body clothing?: None Help from another person to put on and taking off regular lower body clothing?: None 6 Click Score: 24   End of Session Equipment Utilized During Treatment: Rolling walker (2 wheels) Nurse Communication: Mobility status  Activity Tolerance: Patient tolerated treatment well Patient left: with call bell/phone within reach;with nursing/sitter in room  OT Visit Diagnosis: Other abnormalities of gait and mobility (R26.89)                Time: 2536-6440 OT Time Calculation (min): 31 min Charges:  OT General Charges $OT Visit: 1 Visit OT Evaluation $OT Eval Low Complexity: 1 Low OT Treatments $Self Care/Home Management : 8-22 mins Eryn Krejci, OTR/L 10/06/23, 9:46 AM Constance Goltz 10/06/2023, 9:39 AM

## 2023-10-06 NOTE — Progress Notes (Signed)
Mobility Specialist - Progress Note   10/06/23 1526  Mobility  Activity Ambulated independently to bathroom  Level of Assistance Modified independent, requires aide device or extra time  Assistive Device Front wheel walker  Distance Ambulated (ft) 24 ft  Activity Response Tolerated well  $Mobility charge 1 Mobility  Mobility Specialist Start Time (ACUTE ONLY) 1452  Mobility Specialist Stop Time (ACUTE ONLY) 1512  Mobility Specialist Time Calculation (min) (ACUTE ONLY) 20 min   Pt requesting to use the bathroom. Pt supine upon entry, utilizing RA. Pt completed bed mob, STS to RW and amb to/from the bathroom ModI-- extra time during amb. Pt returned to bed, expressed nausea and pain in abd area, RN notified. Pt left supine with needs within reach, RN and MD present at bedside upon MS exit.  Zetta Bills Mobility Specialist 10/06/23 3:30 PM

## 2023-10-06 NOTE — Consult Note (Signed)
[facility] SURGICAL CONSULTATION NOTE (initial) - cpt: 16109   HISTORY OF PRESENT ILLNESS (HPI):  52 y.o. female presented to [facility] ED on 11/23 for evaluation of vaginal pain. Patient reports prior to presentation she noticed drainage superior to her right labia. No fever, chills. 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 She did spend 80 days there. She is worried that this may have come back. No other accompanying symptoms. Work up in the ED revealed a leukocytosis to 10.0K, Hgb to 14.6, renal function normal; sCr - 0.61, no electrolyte derangements, venous lactate normal at 1.0. She did have a CT Pelvis which was concerning for questionable cellulitis of the pannus, no evidence of abscess, no subcutaneous emphysema. She also has a large known ventral hernia. She was admitted to the medicine service. She is currently on Cefepime and Vancomycin. She has been seen by WOC this morning.   Surgery is consulted by hospitalist physician Dr. Nicki Reaper, MD in this context for evaluation and management of possible pannicular abscess.   PAST MEDICAL HISTORY (PMH):  Past Medical History:  Diagnosis Date   Anxiety and depression 05/10/2023   Candidiasis 06/26/2023   Diabetes mellitus without complication (HCC)    Elevated cholesterol 06/26/2023   History of urinary tract infection 11/18/2022   Hypertension    Morbid obesity (HCC) 05/10/2023   Necrotizing fasciitis (HCC) 10/24/2022   Neuropathy 05/10/2023   Other chronic pain 05/30/2023     PAST SURGICAL HISTORY (PSH):  Past Surgical History:  Procedure Laterality Date   COLONOSCOPY WITH PROPOFOL N/A 09/02/2023   Procedure: COLONOSCOPY WITH PROPOFOL;  Surgeon: Wyline Mood, MD;  Location: [facility] ENDOSCOPY;  Service: Gastroenterology;  Laterality: N/A;   ESOPHAGOGASTRODUODENOSCOPY (EGD) WITH  PROPOFOL N/A 09/02/2023   Procedure: ESOPHAGOGASTRODUODENOSCOPY (EGD) WITH PROPOFOL;  Surgeon: Wyline Mood, MD;  Location: [facility] ENDOSCOPY;  Service: Gastroenterology;  Laterality: N/A;   INCISION AND DRAINAGE OF WOUND Bilateral 09/15/2022   Procedure: IRRIGATION AND DEBRIDEMENT WOUND;  Surgeon: Carolan Shiver, MD;  Location: [facility] ORS;  Service: General;  Laterality: Bilateral;   POLYPECTOMY  09/02/2023   Procedure: POLYPECTOMY;  Surgeon: Wyline Mood, MD;  Location: [facility] ENDOSCOPY;  Service: Gastroenterology;;   TONSILLECTOMY       MEDICATIONS:  Prior to Admission medications   Medication Sig Start Date End Date Taking? Authorizing Provider  gabapentin (NEURONTIN) 300 MG capsule Take 1 capsule (300 mg total) by mouth 3 (three) times daily. 04/29/23   Kara Dies, NP  hydrOXYzine (VISTARIL) 50 MG capsule TAKE 1 CAPSULE BY MOUTH THREE TIMES DAILY AS NEEDED 08/20/23   Ostwalt, Edmon Crape, PA-C  mupirocin ointment (BACTROBAN) 2 % Apply 1 Application topically 2 (two) times daily. 08/01/23   Felecia Shelling, DPM  nitrofurantoin, macrocrystal-monohydrate, (MACROBID) 100 MG capsule Take 1 capsule (100 mg total) by mouth 2 (two) times daily. Patient not taking: Reported on 09/02/2023 08/04/23   Debera Lat, PA-C  pantoprazole (PROTONIX) 40 MG tablet Take 1 tablet (40 mg total) by mouth daily. 07/16/23 10/14/23  Celso Amy, PA-C     ALLERGIES:  Allergies  Allergen Reactions   Amoxicillin Rash    Tolerated Zosyn 09/16/22 with no apparent issue     SOCIAL HISTORY:  Social History   Socioeconomic History   Marital status: Single    Spouse name: Not on file   Number of children: Not on file   Years  of education: Not on file   Highest education level: GED or equivalent  Occupational History   Not on file  Tobacco Use   Smoking status: Former    Types: Cigarettes   Smokeless tobacco: Never  Vaping Use   Vaping status: Never Used  Substance and Sexual Activity   Alcohol use: Not  Currently   Drug use: Not Currently   Sexual activity: Not on file  Other Topics Concern   Not on file  Social History Narrative   Not on file   Social Determinants of Health   Financial Resource Strain: High Risk (09/24/2023)   Received from [facility]   Overall Financial Resource Strain (CARDIA)    Difficulty of Paying Living Expenses: Very hard  Food Insecurity: No Food Insecurity (10/05/2023)   Hunger Vital Sign    Worried About Running Out of Food in the Last Year: Never true    Ran Out of Food in the Last Year: Never true  Recent Concern: Food Insecurity - Food Insecurity Present (09/24/2023)   Received from [facility]   Hunger Vital Sign    Worried About Running Out of Food in the Last Year: Often true    Ran Out of Food in the Last Year: Sometimes true  Transportation Needs: No Transportation Needs (10/05/2023)   PRAPARE - Administrator, Civil Service (Medical): No    Lack of Transportation (Non-Medical): No  Physical Activity: Unknown (05/28/2023)   Exercise Vital Sign    Days of Exercise per Week: 0 days    Minutes of Exercise per Session: Not on file  Stress: Stress Concern Present (05/28/2023)   [employer] of Occupational Health - Occupational Stress Questionnaire    Feeling of Stress : Very much  Social Connections: Socially Isolated (05/28/2023)   Social Connection and Isolation Panel [NHANES]    Frequency of Communication with Friends and Family: Never    Frequency of Social Gatherings with Friends and Family: Never    Attends Religious Services: Never    Database administrator or Organizations: No    Attends Engineer, structural: Not on file    Marital Status: Never married  Intimate Partner Violence: Not At Risk (10/05/2023)   Humiliation, Afraid, Rape, and Kick questionnaire    Fear of Current or Ex-Partner: No    Emotionally Abused: No    Physically Abused: No    Sexually Abused: No      FAMILY HISTORY:  Family History  Problem Relation Age of Onset   Stroke Mother    Miscarriages / Stillbirths Mother    Hyperlipidemia Mother    Hypertension Mother    Heart disease Mother    Hearing loss Mother    Diabetes Mother       REVIEW OF SYSTEMS:  Review of Systems  Constitutional:  Negative for chills and fever.  Respiratory:  Negative for cough and shortness of breath.   Cardiovascular:  Negative for chest pain and palpitations.  Gastrointestinal:  Negative for nausea and vomiting.  Genitourinary:  Negative for dysuria and urgency.  Skin:  Negative for itching and rash.       + Drainage from pannus   All other systems reviewed and are negative.   VITAL SIGNS:  Temp:  [98 F (36.7 C)-98.3 F (36.8 C)] 98 F (36.7 C) (11/25 0812) Pulse Rate:  [62-72] 62 (11/25 0812) Resp:  [16-18] 16 (11/25 0812) BP: (115-135)/(57-67) 115/57 (11/25 0812) SpO2:  [95 %-  97 %] 97 % (11/25 0812)     Height: 5\' 6"  (167.6 cm) Weight: (!) 166.8 kg BMI (Calculated): 59.38   INTAKE/OUTPUT:  11/24 0701 - 11/25 0700 In: 433.6 [IV Piggyback:433.6] Out: -   PHYSICAL EXAM:  Physical Exam Vitals and nursing note reviewed. Exam conducted with a chaperone present.  Constitutional:      General: She is not in acute distress.    Appearance: Normal appearance. She is obese. She is not ill-appearing.     Comments: Patient resting in bed; NAD  HENT:     Head: Normocephalic and atraumatic.  Eyes:     General: No scleral icterus.    Conjunctiva/sclera: Conjunctivae normal.  Pulmonary:     Effort: Pulmonary effort is normal. No respiratory distress.  Genitourinary:    Comments: Deferred Skin:    General: Skin is warm and dry.     Findings: Abscess, erythema and wound present.     Comments: Superior to the right labia/mons pubis, there is a small pinpoint opening which is draining purulence, this was expressed, this is tender.   Neurological:     General: No focal deficit present.      Mental Status: She is alert and oriented to person, place, and time.  Psychiatric:        Mood and Affect: Mood normal.        Behavior: Behavior normal.      Labs:     Latest Ref Rng & Units 10/06/2023    6:18 AM 10/04/2023    7:08 PM 06/26/2023   10:02 AM  CBC  WBC 4.0 - 10.5 K/uL 9.7  10.0  7.8   Hemoglobin 12.0 - 15.0 g/dL 78.2  95.6  21.3   Hematocrit 36.0 - 46.0 % 37.3  41.3  42.0   Platelets 150 - 400 K/uL 253  272  281       Latest Ref Rng & Units 10/06/2023    6:18 AM 10/04/2023    7:08 PM 06/26/2023   10:02 AM  CMP  Glucose 70 - 99 mg/dL 086  578  469   BUN 6 - 20 mg/dL 18  23  13    Creatinine 0.44 - 1.00 mg/dL 6.29  5.28  4.13   Sodium 135 - 145 mmol/L 134  135  140   Potassium 3.5 - 5.1 mmol/L 3.6  4.0  4.6   Chloride 98 - 111 mmol/L 103  102  104   CO2 22 - 32 mmol/L 21  23  21    Calcium 8.9 - 10.3 mg/dL 8.5  9.0  24.4   Total Protein 6.5 - 8.1 g/dL  8.4  7.8   Total Bilirubin <1.2 mg/dL  1.3  0.5   Alkaline Phos 38 - 126 U/L  116  126   AST 15 - 41 U/L  18  35   ALT 0 - 44 U/L  18  25     Imaging studies:   CT Pelvis (10/05/2023) personally reviewed with thickening of the pannus consistent with cellulitis, no subcutaneous air, no undrained abscess, and radiologist report reviewed below:  IMPRESSION: 1. Diffuse subcutaneous edema with skin thickening in the lower aspect of the pannus. Findings may be related to cellulitis. No evidence for soft tissue gas or focal fluid collection. 2. Large midline ventral hernia containing nondilated bowel.   Assessment/Plan:  52 y.o. female with spontaneously draining right pannicular/mons pubis wound, complicated by morbid obesity and Hx of necrotizing infection of the abdominal pannus   -  Purulence expressed at bedside. Wound was then gently packed with 1/2 inch packing strips. Will try and leave this in place over night. If wound continues to drain or worsen, may need bedside I&D tomorrow (11/26). We will follow    - Continue Abx for now (Cefepime, Vancomycin)   - Pain control prn; antiemetics prn  - Continue glycemic control  - Further management per primary service; we will follow   All of the above findings and recommendations were discussed with the patient, and all of patient's questions were answered to her expressed satisfaction.  Thank you for the opportunity to participate in this patient's care.   -- Lynden Oxford, PA-C [facility] 10/06/2023, 2:39 PM M-F: 7am - 4pm

## 2023-10-06 NOTE — Plan of Care (Signed)
Problem: Health Behavior/Discharge Planning: Goal: Ability to manage health-related needs will improve Outcome: Progressing

## 2023-10-06 NOTE — Progress Notes (Signed)
1      PROGRESS NOTE    Tracy Woods  QVZ:[id number] DOB: Feb 01, 1971 DOA: 10/04/2023 PCP: Debera Lat, PA-C    Brief Narrative:   52 y.o. 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, who presents to the ED with pain and swelling in the mons pubis and lower abdominal wall   11/25: Wound care nurse and surgery consult  Assessment & Plan:   Active Problems:   Cellulitis of abdominal wall   Morbid obesity with BMI of 50.0-59.9, adult (HCC)   Type 2 diabetes mellitus with other specified complication (HCC)   Anxiety and depression   History of abdominal wall necrotizing fasciitis 09/2022   Ventral hernia without obstruction or gangrene  Abdominal wall cellulitis/abscess History of necrotizing fasciitis abdominal wall November 2023 CT abdomen and pelvis showing diffuse subcutaneous edema lower aspect of pannus with no focal fluid collection or soft tissue gas Will treat aggressively with vancomycin and cefepime given past history Pain control, symptom control Appreciate surgical and wound care input Per wound care nurse she has - chronic yeast but there is a 2x2 area of induration that I was able to express some brown purulent foul smelling drainage from.  Surgery evaluated and did packing.  She may need drainage based on their evaluation tomorrow She is set up for wound care clinic at [facility] as an outpatient on 12/30  Ventral hernia without obstruction or gangrene No acute issues.  Continue outpatient follow-up Wound care nurse did clean the umbilical wound and placed silver Recommends to keep it dry   Anxiety and depression Continue hydroxyzine and gabapentin   Type 2 diabetes mellitus with other specified complication (HCC) Sliding scale insulin coverage   Morbid obesity with BMI of 50.0-59.9, adult  (HCC) Complicating factor to overall prognosis and care   DVT prophylaxis: Lovenox      Code Status: (Full code Family Communication: (NO "discussed with patient") Disposition Plan: Possible discharge in next 2 to 3 days depending on surgical eval and clinical progress   Consultants:  Surgery   Antimicrobials:  Cefepime Vancomycin   Subjective:  Patient is hoping to get her wound drainage done while here and requesting surgical evaluation  Objective: Vitals:   10/05/23 0317 10/05/23 0922 10/05/23 2053 10/06/23 0812  BP: (!) 121/56 (!) 143/80 135/67 (!) 115/57  Pulse: 85 88 72 62  Resp: 18 16 18 16   Temp: 98.1 F (36.7 C) (!) 97.5 F (36.4 C) 98.3 F (36.8 C) 98 F (36.7 C)  TempSrc: Oral Oral Oral Oral  SpO2: 98% 96% 95% 97%  Weight: (!) 166.8 kg     Height: 5\' 6"  (1.676 m)       Intake/Output Summary (Last 24 hours) at 10/06/2023 1525 Last data filed at 10/06/2023 0300 Gross per 24 hour  Intake 433.62 ml  Output --  Net 433.62 ml   Filed Weights   10/04/23 1904 10/05/23 0317  Weight: (!) 158.8 kg (!) 166.8 kg    Examination:  General exam: 52 year old morbidly obese female appears calm and comfortable  Respiratory system: Clear to auscultation. Respiratory effort normal. Cardiovascular system: S1 & S2 heard, RRR. No JVD, murmurs, rubs, gallops or clicks. No pedal edema. Gastrointestinal system: Large midline ventral hernia containing bowels. Central nervous system: Alert and oriented. No focal neurological deficits. Extremities: Symmetric 5 x 5 power. Skin: Overall difficult examination  due to morbid obesity.  Umbilical/pannus wound, erythema intertrigo.  Please see wound care nurse documentation for details.  I agree with their assessment Psychiatry: Judgement and insight appear normal. Mood & affect appropriate.     Data Reviewed: I have personally reviewed following labs and imaging studies  CBC: Recent Labs  Lab 10/04/23 1908  10/06/23 0618  WBC 10.0 9.7  NEUTROABS 7.4  --   HGB 14.6 12.7  HCT 41.3 37.3  MCV 81.9 83.1  PLT 272 253   Basic Metabolic Panel: Recent Labs  Lab 10/04/23 1908 10/06/23 0618  NA 135 134*  K 4.0 3.6  CL 102 103  CO2 23 21*  GLUCOSE 134* 127*  BUN 23* 18  CREATININE 0.61 0.71  CALCIUM 9.0 8.5*   GFR: Estimated Creatinine Clearance: 131.3 mL/min (by C-G formula based on SCr of 0.71 mg/dL). Liver Function Tests: Recent Labs  Lab 10/04/23 1908  AST 18  ALT 18  ALKPHOS 116  BILITOT 1.3*  PROT 8.4*  ALBUMIN 3.4*    CBG: Recent Labs  Lab 10/05/23 1239 10/05/23 1640 10/05/23 2238 10/06/23 0808 10/06/23 1114  GLUCAP 119* 116* 89 95 121*    Sepsis Labs: Recent Labs  Lab 10/05/23 0003  LATICACIDVEN 1.0    Recent Results (from the past 240 hour(s))  Blood culture (routine x 2)     Status: None (Preliminary result)   Collection Time: 10/04/23  7:08 PM   Specimen: BLOOD  Result Value Ref Range Status   Specimen Description BLOOD BLOOD RIGHT HAND  Final   Special Requests   Final    BOTTLES DRAWN AEROBIC AND ANAEROBIC Blood Culture adequate volume   Culture   Final    NO GROWTH 2 DAYS Performed at [facility], [address], [place], [place] [address]    Report Status PENDING  Incomplete  Blood culture (routine x 2)     Status: None (Preliminary result)   Collection Time: 10/05/23 12:02 AM   Specimen: BLOOD  Result Value Ref Range Status   Specimen Description BLOOD LEFT ANTECUBITAL  Final   Special Requests   Final    BOTTLES DRAWN AEROBIC AND ANAEROBIC Blood Culture results may not be optimal due to an inadequate volume of blood received in culture bottles   Culture   Final    NO GROWTH 1 DAY Performed at [facility], [address], [place], [place] [address]    Report Status PENDING  Incomplete         Radiology Studies: CT PELVIS W CONTRAST  Result Date: 10/05/2023 CLINICAL DATA:  History of right labial abscess.  EXAM: CT PELVIS WITH CONTRAST TECHNIQUE: Multidetector CT imaging of the pelvis was performed using the standard protocol following the bolus administration of intravenous contrast. RADIATION DOSE REDUCTION: This exam was performed according to the departmental dose-optimization program which includes automated exposure control, adjustment of the mA and/or kV according to patient size and/or use of iterative reconstruction technique. CONTRAST:  OMNIPAQUE IOHEXOL 350 MG/ML SOLN COMPARISON:  CT abdomen and pelvis 05/29/2023 FINDINGS: Urinary Tract:  No abnormality visualized. Bowel:  Unremarkable visualized pelvic bowel loops. Vascular/Lymphatic: No pathologically enlarged lymph nodes. No significant vascular abnormality seen. Reproductive:  No mass or other significant abnormality Other: There is a large midline ventral hernia containing nondilated bowel similar to the prior study. There is diffuse subcutaneous edema with some since skin thickening in the lower aspect of the pannus. There is no evidence for soft tissue gas or  focal fluid collection. Musculoskeletal: No suspicious bone lesions identified. IMPRESSION: 1. Diffuse subcutaneous edema with skin thickening in the lower aspect of the pannus. Findings may be related to cellulitis. No evidence for soft tissue gas or focal fluid collection. 2. Large midline ventral hernia containing nondilated bowel. Electronically Signed   By: Darliss Cheney M.D.   On: 10/05/2023 01:14        Scheduled Meds:  enoxaparin (LOVENOX) injection  0.5 mg/kg Subcutaneous Q24H   gabapentin  300 mg Oral TID   insulin aspart  0-20 Units Subcutaneous TID WC   insulin aspart  0-5 Units Subcutaneous QHS    morphine injection  4 mg Intravenous Once   pantoprazole  40 mg Oral Daily   polyethylene glycol  17 g Oral BID   senna-docusate  2 tablet Oral BID   triamcinolone cream   Topical BID   Continuous Infusions:  ceFEPime (MAXIPIME) IV 2 g (10/06/23 1003)   vancomycin  200 mL/hr at 10/06/23 0226   And   vancomycin 500 mg (10/06/23 0240)     LOS: 1 day    Time spent: 35 minutes    Guiselle Mian Sherryll Burger, MD [facility] Pager [phone]  If 7PM-7AM, please contact night-coverage [email or web address] Password [email or web address] 10/06/2023, 3:25 PM

## 2023-10-06 NOTE — Consult Note (Addendum)
WOC Nurse Consult Note: patient with history of necrotizing fascitis to  right thigh and lower abdominal wall treated at [facility] 10/2022; presented to Emergency Room at [facility] with drainage from labia/mons area  Reason for Consult:umbilical/pannus wound  Wound type: 1.  Partial thickness skin loss to left side of umbilicus likely due to moisture and friction  2.  Full thickness wound mons/R labia infectious  3.  Intertriginous dermatitis under breasts and pannus/groin  Pressure Injury POA: NA  Measurement: 1.  Left Umbilicus 6 cm x 2 cm able to insert Q tip 4 cm deep; 100% pink moist  2.  Widespread erythema underneath pannus appears fungal in nature; has a1.5 cm x 1.5 cm area of induration that I am able to express brown milky foul smelling drainage from  3.  Widespread erythema under breasts with scattered partial thickness skin loss  ICD-10 CM Codes for Irritant Dermatitis  L30.4  - Erythema intertrigo. Also used for abrasion of the hand, chafing of the skin, dermatitis due to sweating and friction, friction dermatitis, friction eczema, and genital/thigh intertrigo.  Wound bed: as above  Drainage (amount, consistency, odor) foul smelling tan exudate umbilicus; foul smelling brown milky drainage from mons as above  Periwound: erythema  Dressing procedure/placement/frequency: Cleanse L umbilical wound with Vashe moistened gauze making sure to clean depth, cut a piece of silver hydrofiber and using Q tip applicator pack down into wound daily. May cover with silicone foam or ABD pad if patient desires. May leave open to air.  Clean underneath breasts and axillae with Vashe moistened gauze and apply Kenalog cream 2 times daily as prescribed by MD.  Clean underneath pannus/groin with Vashe moistened gauze.  Place Interdry to sides of pannus.  Apply silver hydrofiber [place] [phone]) to draining wound on mons, cover with ABD pad which is patients preference.    Interdry to be applied as  follows: Forensic scientist # [phone] Measure and cut length of InterDry to fit in skin folds that have skin breakdown Tuck InterDry fabric into skin folds in a single layer, allow for 2 inches of overhang from skin edges to allow for wicking to occur May remove to bathe; dry area thoroughly and then tuck into affected areas again Do not apply any creams or ointments when using InterDry DO NOT THROW AWAY FOR 5 DAYS unless soiled with stool DO NOT [facility] product, this will inactivate the silver in the material  New sheet of Interdry should be applied after 5 days of use if patient continues to have skin breakdown     Patient does not like creams or powders, does not agree to Interdry anywhere but sides of pannus.  Patient would likely benefit from antifungal powder/cream to inner thighs and groin but does not want this. Patient interested in surgical eval for area to mons/labia.  I told her this area is already spontaneously draining but will secure chat primary MD regarding this.    CT read as diffuse subcutaneous edema with skin thickening in the lower aspect of the pannus. Findings may be related to cellulitis. No evidence for soft tissue gas or focal fluid collection. 2. Large midline ventral hernia containing nondilated bowe  Appreciate E. Brophy assistance with this consult.  POC discussed with patient, bedside nurse and primary MD. WOC team will not follow. Re-consult if further needs arise.   Thank you,    Priscella Mann MSN, RN-BC, [employer] [phone]

## 2023-10-06 NOTE — Progress Notes (Signed)
Order received for senokot from Dr Sherryll Burger per patients request

## 2023-10-07 DIAGNOSIS — F419 Anxiety disorder, unspecified: Secondary | ICD-10-CM | POA: Diagnosis not present

## 2023-10-07 DIAGNOSIS — Z8739 Personal history of other diseases of the musculoskeletal system and connective tissue: Secondary | ICD-10-CM | POA: Diagnosis not present

## 2023-10-07 DIAGNOSIS — S31105A Unspecified open wound of abdominal wall, periumbilic region without penetration into peritoneal cavity, initial encounter: Secondary | ICD-10-CM | POA: Diagnosis not present

## 2023-10-07 DIAGNOSIS — L03311 Cellulitis of abdominal wall: Secondary | ICD-10-CM | POA: Diagnosis not present

## 2023-10-07 LAB — URINALYSIS, W/ REFLEX TO CULTURE (INFECTION SUSPECTED)
Bilirubin Urine: NEGATIVE
Glucose, UA: NEGATIVE mg/dL
Hgb urine dipstick: NEGATIVE
Ketones, ur: NEGATIVE mg/dL
Leukocytes,Ua: NEGATIVE
Nitrite: NEGATIVE
Protein, ur: NEGATIVE mg/dL
Specific Gravity, Urine: 1.018 (ref 1.005–1.030)
pH: 5 (ref 5.0–8.0)

## 2023-10-07 LAB — GLUCOSE, CAPILLARY
Glucose-Capillary: 89 mg/dL (ref 70–99)
Glucose-Capillary: 110 mg/dL — ABNORMAL HIGH (ref 70–99)
Glucose-Capillary: 131 mg/dL — ABNORMAL HIGH (ref 70–99)

## 2023-10-07 LAB — CBC
HCT: 35.3 % — ABNORMAL LOW (ref 36.0–46.0)
Hemoglobin: 12.3 g/dL (ref 12.0–15.0)
MCH: 28.5 pg (ref 26.0–34.0)
MCHC: 34.8 g/dL (ref 30.0–36.0)
MCV: 81.9 fL (ref 80.0–100.0)
Platelets: 254 10*3/uL (ref 150–400)
RBC: 4.31 MIL/uL (ref 3.87–5.11)
RDW: 12.6 % (ref 11.5–15.5)
WBC: 7 10*3/uL (ref 4.0–10.5)
nRBC: 0 % (ref 0.0–0.2)

## 2023-10-07 LAB — BASIC METABOLIC PANEL
Anion gap: 7 (ref 5–15)
BUN: 21 mg/dL — ABNORMAL HIGH (ref 6–20)
CO2: 24 mmol/L (ref 22–32)
Calcium: 8.7 mg/dL — ABNORMAL LOW (ref 8.9–10.3)
Chloride: 105 mmol/L (ref 98–111)
Creatinine, Ser: 0.7 mg/dL (ref 0.44–1.00)
GFR, Estimated: >60 mL/min (ref >60)
Glucose, Bld: 115 mg/dL — ABNORMAL HIGH (ref 70–99)
Potassium: 4.1 mmol/L (ref 3.5–5.1)
Sodium: 136 mmol/L (ref 135–145)

## 2023-10-07 MED ORDER — LIDOCAINE-EPINEPHRINE 1 %-1:100000 IJ SOLN
20.0000 mL | Freq: Once | INTRAMUSCULAR | Status: DC
  Filled 2023-10-07: qty 20

## 2023-10-07 NOTE — Plan of Care (Signed)
Problem: Coping: Goal: Ability to adjust to condition or change in health will improve Outcome: Progressing   Problem: Health Behavior/Discharge Planning: Goal: Ability to identify and utilize available resources and services will improve Outcome: Progressing   Problem: Metabolic: Goal: Ability to maintain appropriate glucose levels will improve Outcome: Progressing   Problem: Skin Integrity: Goal: Risk for impaired skin integrity will decrease Outcome: Progressing   Problem: Tissue Perfusion: Goal: Adequacy of tissue perfusion will improve Outcome: Progressing

## 2023-10-07 NOTE — Progress Notes (Signed)
[facility] SURGICAL PROGRESS NOTE (cpt [phone])  Hospital Day(s): 2.   Interval History: Patient seen and examined, no acute events or new complaints overnight. Patient reports continued soreness at mons pubis. No fever, chills. She remains without leukocytosis; 7.0K. Hgb to 12.3; stable. Renal function normal; sCr - 0.70; UO - unmeasured. No electrolyte derangements. She continues on Cefepime, Vancomycin.   Review of Systems:  Constitutional: denies fever, chills  HEENT: denies cough or congestion  Respiratory: denies any shortness of breath  Cardiovascular: denies chest pain or palpitations  Gastrointestinal: denies abdominal pain, N/V Genitourinary: denies burning with urination or urinary frequency Musculoskeletal: denies pain, decreased motor or sensation Integumentary: + Mons cellulitis/drainage  Vital signs in last 24 hours: [min-max] current  Temp:  [97.7 F (36.5 C)-98.5 F (36.9 C)] 98.2 F (36.8 C) (11/26 0430) Pulse Rate:  [62-79] 67 (11/26 0430) Resp:  [16-18] 18 (11/26 0430) BP: (115-132)/(57-63) 124/60 (11/26 0430) SpO2:  [95 %-97 %] 96 % (11/26 0430)     Height: 5\' 6"  (167.6 cm) Weight: (!) 166.8 kg BMI (Calculated): 59.38   Intake/Output last 2 shifts:  No intake/output data recorded.   Physical Exam:  Constitutional: alert, cooperative and no distress  HENT: normocephalic without obvious abnormality  Eyes: PERRL, EOM's grossly intact and symmetric  Respiratory: breathing non-labored at rest  Cardiovascular: regular rate and sinus rhythm  Gastrointestinal: soft, non-tender, and non-distended, known umbilical/ventral hernia, soft, non-tender Integumentary: There is maceration to overlay skin around ventral hernia, primarily to the inferior portion, no drainage. Superior to the right labia/mons pubis, there is a small pinpoint opening which is draining purulence, this was expressed, this is tender.      Labs:     Latest Ref Rng & Units  10/07/2023    5:34 AM 10/06/2023    6:18 AM 10/04/2023    7:08 PM  CBC  WBC 4.0 - 10.5 K/uL 7.0  9.7  10.0   Hemoglobin 12.0 - 15.0 g/dL 60.4  54.0  98.1   Hematocrit 36.0 - 46.0 % 35.3  37.3  41.3   Platelets 150 - 400 K/uL 254  253  272       Latest Ref Rng & Units 10/07/2023    5:34 AM 10/06/2023    6:18 AM 10/04/2023    7:08 PM  CMP  Glucose 70 - 99 mg/dL 191  478  295   BUN 6 - 20 mg/dL 21  18  23    Creatinine 0.44 - 1.00 mg/dL 6.21  3.08  6.57   Sodium 135 - 145 mmol/L 136  134  135   Potassium 3.5 - 5.1 mmol/L 4.1  3.6  4.0   Chloride 98 - 111 mmol/L 105  103  102   CO2 22 - 32 mmol/L 24  21  23    Calcium 8.9 - 10.3 mg/dL 8.7  8.5  9.0   Total Protein 6.5 - 8.1 g/dL   8.4   Total Bilirubin <1.2 mg/dL   1.3   Alkaline Phos 38 - 126 U/L   116   AST 15 - 41 U/L   18   ALT 0 - 44 U/L   18     Imaging studies: No new pertinent imaging studies   Assessment/Plan:  52 y.o. female with spontaneously draining right pannicular/mons pubis wound, complicated by morbid obesity and Hx of necrotizing infection of the abdominal pannus    - Will plan for bedside I&D with Dr Maurine Minister today; lidocaine ordered   -  All risks, benefits, and alternatives to above procedure(s) were discussed with the patient, all of her questions were answered to her expressed satisfaction, patient expresses she wishes to proceed, and informed consent was obtained.   - Regarding ventral hernia and overlaying skin changes, continue local wound care as outlined by WOC; greatly appreciate assistance. Need to ensure continued wound care to prevent skin breakdown given nature of underlying complex hernia in setting of morbid obesity which would greatly complicate any surgical repair.   - Okay to continue diet given plans to complete this at bedside - Continue IV Abx (Cefepime, Vancomycin)  - Pain control prn   - Continue glycemic control   - Further management per primary service; we will follow    All of the  above findings and recommendations were discussed with the patient, and the medical team, and all of patient's questions were answered to her expressed satisfaction.  -- Lynden Oxford, PA-C [facility] 10/07/2023, 7:16 AM M-F: 7am - 4pm

## 2023-10-07 NOTE — Progress Notes (Signed)
1      PROGRESS NOTE    Tracy Woods  XBJ:[id number] DOB: 08/02/1971 DOA: 10/04/2023 PCP: Debera Lat, PA-C    Brief Narrative:   52 y.o. 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, who presents to the ED with pain and swelling in the mons pubis and lower abdominal wall   11/25: Wound care nurse and surgery consult 11/26: Surgery planning I&D at bedside  Assessment & Plan:   Active Problems:   Cellulitis of abdominal wall   Morbid obesity with BMI of 50.0-59.9, adult (HCC)   Type 2 diabetes mellitus with other specified complication (HCC)   Anxiety and depression   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  Surgery planning I&D at bedside today Tracy Woods is set up for wound care clinic at [facility] as an outpatient on 12/30  Ventral hernia without obstruction or gangrene No acute issues.  Continue outpatient follow-up Wound care nurse did clean the umbilical wound and placed silver Recommends to keep it dry   Anxiety and depression Continue hydroxyzine and gabapentin   Type 2 diabetes mellitus with other specified complication (HCC) Sliding scale insulin coverage   Morbid obesity with BMI of 50.0-59.9, adult  (HCC) Complicating factor to overall prognosis and care   DVT prophylaxis: Lovenox      Code Status: (Full code Family Communication: (NO "discussed with patient") Disposition Plan: Possible discharge in next 1-2 days depending on surgical eval and clinical progress   Consultants:  Surgery   Antimicrobials:  Cefepime Vancomycin   Subjective:  Tracy Woods is thankful that surgery team has agreed to do I&D as Tracy Woods has been struggling with her abscess drainage  Objective: Vitals:   10/06/23 1540 10/06/23 2034 10/07/23 0430 10/07/23 0821  BP: 132/62 126/63 124/60 115/68  Pulse: 79 67 67 72  Resp: 18 18 18 15   Temp: 97.7 F (36.5 C) 98.5 F (36.9 C) 98.2 F (36.8 C) 98 F (36.7 C)  TempSrc:  Oral Oral Oral  SpO2: 96% 95% 96% 96%  Weight:      Height:        Intake/Output Summary (Last 24 hours) at 10/07/2023 1634 Last data filed at 10/07/2023 0900 Gross per 24 hour  Intake --  Output 800 ml  Net -800 ml   Filed Weights   10/04/23 1904 10/05/23 0317  Weight: (!) 158.8 kg (!) 166.8 kg    Examination:  General exam: 52 year old morbidly obese female appears calm and comfortable  Respiratory system: Clear to auscultation. Respiratory effort normal. Cardiovascular system: S1 & S2 heard, RRR. No JVD, murmurs, rubs, gallops or clicks. No pedal edema. Gastrointestinal system: Large midline ventral hernia containing bowels. Central nervous system: Alert and oriented. No focal neurological deficits. Extremities: Symmetric 5 x 5 power. Skin: Overall difficult examination due to morbid obesity.  Umbilical/pannus wound, erythema intertrigo.  Please see wound care nurse documentation for details.  I agree with their assessment Psychiatry: Judgement and insight appear normal. Mood & affect appropriate.     Data Reviewed: I have personally reviewed following labs and imaging studies  CBC: Recent Labs  Lab 10/04/23 1908 10/06/23 0618 10/07/23 0534  WBC 10.0 9.7 7.0   NEUTROABS 7.4  --   --   HGB 14.6 12.7 12.3  HCT 41.3 37.3 35.3*  MCV 81.9 83.1 81.9  PLT 272 253 254   Basic Metabolic Panel: Recent Labs  Lab 10/04/23 1908 10/06/23 0618 10/07/23 0534  NA 135 134* 136  K 4.0 3.6 4.1  CL 102 103 105  CO2 23 21* 24  GLUCOSE 134* 127* 115*  BUN 23* 18 21*  CREATININE 0.61 0.71 0.70  CALCIUM 9.0 8.5* 8.7*   GFR: Estimated Creatinine Clearance: 131.3 mL/min (by C-G formula based on SCr of 0.7 mg/dL). Liver Function Tests: Recent Labs  Lab 10/04/23 1908  AST 18  ALT 18  ALKPHOS 116  BILITOT 1.3*  PROT 8.4*  ALBUMIN 3.4*    CBG: Recent Labs  Lab 10/06/23 1114 10/06/23 1641 10/06/23 2125 10/07/23 0847 10/07/23 1115  GLUCAP 121* 118* 98 89 110*    Sepsis Labs: Recent Labs  Lab 10/05/23 0003  LATICACIDVEN 1.0    Recent Results (from the past 240 hour(s))  Blood culture (routine x 2)     Status: None (Preliminary result)   Collection Time: 10/04/23  7:08 PM   Specimen: BLOOD  Result Value Ref Range Status   Specimen Description BLOOD BLOOD RIGHT HAND  Final   Special Requests   Final    BOTTLES DRAWN AEROBIC AND ANAEROBIC Blood Culture adequate volume   Culture   Final    NO GROWTH 3 DAYS Performed at [facility], [address], [place], [place] [address]    Report Status PENDING  Incomplete  Blood culture (routine x 2)     Status: None (Preliminary result)   Collection Time: 10/05/23 12:02 AM   Specimen: BLOOD  Result Value Ref Range Status   Specimen Description BLOOD LEFT ANTECUBITAL  Final   Special Requests   Final    BOTTLES DRAWN AEROBIC AND ANAEROBIC Blood Culture results may not be optimal due to an inadequate volume of blood received in culture bottles   Culture   Final    NO GROWTH 2 DAYS Performed at [facility], [address] [place], [place] [address]    Report Status PENDING  Incomplete         Radiology Studies: No results found.      Scheduled Meds:   enoxaparin (LOVENOX) injection  0.5 mg/kg Subcutaneous Q24H   gabapentin  300 mg Oral TID   insulin aspart  0-20 Units Subcutaneous TID WC   insulin aspart  0-5 Units Subcutaneous QHS   lidocaine-EPINEPHrine  20 mL Intradermal Once   pantoprazole  40 mg Oral Daily   polyethylene glycol  17 g Oral BID   senna-docusate  2 tablet Oral BID   triamcinolone cream   Topical BID   Continuous Infusions:  ceFEPime (MAXIPIME) IV 2 g (10/07/23 1610)   vancomycin 2,000 mg (10/07/23 0436)   And   vancomycin 500 mg (10/07/23 0806)     LOS: 2 days    Time spent: 35 minutes    Rolla Kedzierski Sherryll Burger, MD [facility] Pager [phone]  If 7PM-7AM, please contact night-coverage [email or web address] Password [email or web address] 10/07/2023, 4:34 PM

## 2023-10-08 DIAGNOSIS — L02215 Cutaneous abscess of perineum: Secondary | ICD-10-CM | POA: Diagnosis not present

## 2023-10-08 DIAGNOSIS — E1169 Type 2 diabetes mellitus with other specified complication: Secondary | ICD-10-CM

## 2023-10-08 DIAGNOSIS — L03311 Cellulitis of abdominal wall: Secondary | ICD-10-CM | POA: Diagnosis not present

## 2023-10-08 DIAGNOSIS — F419 Anxiety disorder, unspecified: Secondary | ICD-10-CM | POA: Diagnosis not present

## 2023-10-08 LAB — CBC
HCT: 39.7 % (ref 36.0–46.0)
Hemoglobin: 13.6 g/dL (ref 12.0–15.0)
MCH: 28.1 pg (ref 26.0–34.0)
MCHC: 34.3 g/dL (ref 30.0–36.0)
MCV: 82 fL (ref 80.0–100.0)
Platelets: 326 10*3/uL (ref 150–400)
RBC: 4.84 MIL/uL (ref 3.87–5.11)
RDW: 12.5 % (ref 11.5–15.5)
WBC: 8.6 10*3/uL (ref 4.0–10.5)
nRBC: 0 % (ref 0.0–0.2)

## 2023-10-08 LAB — GLUCOSE, CAPILLARY: Glucose-Capillary: 141 mg/dL — ABNORMAL HIGH (ref 70–99)

## 2023-10-08 LAB — BASIC METABOLIC PANEL
Anion gap: 11 (ref 5–15)
BUN: 20 mg/dL (ref 6–20)
CO2: 21 mmol/L — ABNORMAL LOW (ref 22–32)
Calcium: 9.2 mg/dL (ref 8.9–10.3)
Chloride: 104 mmol/L (ref 98–111)
Creatinine, Ser: 0.77 mg/dL (ref 0.44–1.00)
GFR, Estimated: >60 mL/min (ref >60)
Glucose, Bld: 117 mg/dL — ABNORMAL HIGH (ref 70–99)
Potassium: 4.1 mmol/L (ref 3.5–5.1)
Sodium: 136 mmol/L (ref 135–145)

## 2023-10-08 MED ORDER — DOXYCYCLINE HYCLATE 100 MG PO TABS: 100.0000 mg | ORAL_TABLET | Freq: Two times a day (BID) | ORAL | Status: DC

## 2023-10-08 MED ORDER — CEFUROXIME AXETIL 500 MG PO TABS: 500.0000 mg | ORAL_TABLET | Freq: Two times a day (BID) | ORAL | Status: DC

## 2023-10-08 MED ORDER — TRIAMCINOLONE ACETONIDE 0.1 % EX CREA: TOPICAL_CREAM | Freq: Two times a day (BID) | CUTANEOUS | 0 refills | Status: DC

## 2023-10-08 MED ORDER — CEFUROXIME AXETIL 500 MG PO TABS: 500.0000 mg | ORAL_TABLET | Freq: Two times a day (BID) | ORAL | 0 refills | Status: AC

## 2023-10-08 MED ORDER — DOXYCYCLINE HYCLATE 100 MG PO TABS: 100.0000 mg | ORAL_TABLET | Freq: Two times a day (BID) | ORAL | 0 refills | Status: AC

## 2023-10-08 MED ORDER — POLYETHYLENE GLYCOL 3350 17 G PO PACK: 17.0000 g | PACK | Freq: Two times a day (BID) | ORAL | 0 refills | Status: DC

## 2023-10-08 MED ORDER — HYDROCODONE-ACETAMINOPHEN 5-325 MG PO TABS: 1.0000 | ORAL_TABLET | Freq: Four times a day (QID) | ORAL | 0 refills | Status: DC | PRN

## 2023-10-08 NOTE — Progress Notes (Signed)
Patient discharghing home to self care. Patient educated on dressing changes and doctors prescribed orders. AVS printed and copy given to patient. Patient will call [employer] for transportation home. IV removed, patient is ready to go.

## 2023-10-08 NOTE — Progress Notes (Signed)
[facility] SURGICAL PROGRESS NOTE  Hospital Day(s): 3.   Interval History:  Patient seen and examined No acute events or new complaints overnight.  Patient reports she is feeling better Mons area is less sore No fever, chills  She remains without leukocytosis; 8.6K Hgb to 13.6 Renal function normal; sCr - 0.77; UO - 800 ccs She is on Cefepime/Vancomycin   Vital signs in last 24 hours: [min-max] current  Temp:  [97.7 F (36.5 C)-98.1 F (36.7 C)] 98.1 F (36.7 C) (11/27 0420) Pulse Rate:  [66-73] 73 (11/27 0420) Resp:  [15-18] 18 (11/27 0420) BP: (115-144)/(59-81) 144/81 (11/27 0420) SpO2:  [93 %-97 %] 93 % (11/27 0420)     Height: 5\' 6"  (167.6 cm) Weight: (!) 166.8 kg BMI (Calculated): 59.38   Intake/Output last 2 shifts:  11/26 0701 - 11/27 0700 In: -  Out: 800 [Urine:800]   Physical Exam:  Constitutional: alert, cooperative and no distress  Respiratory: breathing non-labored at rest  Cardiovascular: regular rate and sinus rhythm  Gastrointestinal: soft, non-tender, and non-distended. Known ventral/umbilical hernia is stable, soft, There is maceration to the skin primarily inferior to the hernia itself.  Integumentary: Superior to the right labia/mons pubis, there is now an I&D site, small, drainage serosanguinous, tenderness improved   Labs:     Latest Ref Rng & Units 10/08/2023    5:54 AM 10/07/2023    5:34 AM 10/06/2023    6:18 AM  CBC  WBC 4.0 - 10.5 K/uL 8.6  7.0  9.7   Hemoglobin 12.0 - 15.0 g/dL 40.9  81.1  91.4   Hematocrit 36.0 - 46.0 % 39.7  35.3  37.3   Platelets 150 - 400 K/uL 326  254  253       Latest Ref Rng & Units 10/08/2023    5:54 AM 10/07/2023    5:34 AM 10/06/2023    6:18 AM  CMP  Glucose 70 - 99 mg/dL 782  956  213   BUN 6 - 20 mg/dL 20  21  18    Creatinine 0.44 - 1.00 mg/dL 0.86  5.78  4.69   Sodium 135 - 145 mmol/L 136  136  134   Potassium 3.5 - 5.1 mmol/L 4.1  4.1  3.6   Chloride 98 - 111 mmol/L 104  105  103    CO2 22 - 32 mmol/L 21  24  21    Calcium 8.9 - 10.3 mg/dL 9.2  8.7  8.5     Imaging studies: No new pertinent imaging studies   Assessment/Plan: 52 y.o. female with s/p bedside I&D of mons pubis abscess    - Wound Care: Pack wound daily with packing strips; cover, secure. This should be done daily and as needed. Okay to remove to shower, replace after. Change superpficial dressing as needed  - Agree with local wound care to umbilical hernia skin - Okay to utilize Aquacel Ag at home daily, this can be changed every few days as well.   - Continue IV Abx (Cefepime, Vancomycin); narrow to PO for home - Pain control prn   - Continue glycemic control              - Further management per primary service   - Okay for discharge from surgical perspective. Abx for home to complete 10 days at least. Dressing changes as above.   All of the above findings and recommendations were discussed with the patient, and the medical team, and all of patient's questions were  answered to her expressed satisfaction.  -- Lynden Oxford, PA-C [facility] 10/08/2023, 7:30 AM M-F: 7am - 4pm

## 2023-10-08 NOTE — Discharge Summary (Signed)
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, who presents to the ED with pain and swelling in the mons pubis and lower abdominal wall    Abdominal wall cellulitis/abscess status post incision and drainage by general surgery History of necrotizing fasciitis abdominal wall November 2023 --CT abdomen and pelvis showing diffuse subcutaneous edema lower aspect of pannus with no focal fluid collection or soft tissue gas -treat aggressively with vancomycin and cefepime given past history--change to po cefuroxime +doxycycline ==Appreciate surgical and wound care input-- okay for discharge today --Per wound care nurse she has - chronic yeast but there is a 2x2 area of induration that I was able to express some brown purulent foul smelling drainage from.  --She is set up for wound care clinic at [facility] as an outpatient on 12/30   Ventral hernia without obstruction or gangrene --No acute issues.  Continue outpatient follow-up --Wound care nurse did clean the umbilical wound and placed silver --Recommends to keep it dry   Anxiety and depression --Continue  hydroxyzine and gabapentin   Type 2 diabetes mellitus with other specified complication (HCC) --Sliding scale insulin coverage   Morbid obesity with BMI of 50.0-59.9, adult (HCC) --Complicating factor to overall prognosis and care  Discharge plan discussed with patient she is in agreement. She will discharge to home with outpatient follow-up    Consultants: general surgery Procedures performed: incision and drainage Disposition: home  Diet recommendation:  Discharge Diet Orders (From admission, onward)     Start     Ordered   10/08/23 0000  Diet - low sodium heart healthy        10/08/23 1102            DISCHARGE MEDICATION: Allergies as of 10/08/2023       Reactions   Amoxicillin Rash   Tolerated Zosyn 09/16/22 with no apparent issue        Medication List     STOP taking these medications    nitrofurantoin (macrocrystal-monohydrate) 100 MG capsule Commonly known as: Macrobid       TAKE these medications    cefUROXime 500 MG tablet Commonly known as: CEFTIN Take 1 tablet (500 mg total) by mouth 2 (two) times daily with a meal for 11 doses.   doxycycline 100 MG tablet Commonly known as: VIBRA-TABS Take 1 tablet (100 mg total) by mouth every 12 (twelve) hours for 11 doses.   gabapentin 300 MG capsule Commonly known as: NEURONTIN Take 1 capsule (300 mg total) by mouth 3 (three) times daily.   HYDROcodone-acetaminophen 5-325 MG tablet Commonly known as: NORCO/VICODIN Take 1 tablet by mouth every 6 (six) hours as needed for moderate pain (pain score 4-6).  hydrOXYzine 50 MG capsule Commonly known as: VISTARIL TAKE 1 CAPSULE BY MOUTH THREE TIMES DAILY AS NEEDED   mupirocin ointment 2 % Commonly known as: BACTROBAN Apply 1 Application topically 2 (two) times daily.   pantoprazole 40 MG tablet Commonly known as: PROTONIX Take 1 tablet (40 mg total) by mouth daily.   polyethylene glycol 17 g packet Commonly known as: MIRALAX / GLYCOLAX Take 17  g by mouth 2 (two) times daily.   triamcinolone cream 0.1 % Commonly known as: KENALOG Apply topically 2 (two) times daily.               Discharge Care Instructions  (From admission, onward)           Start     Ordered   10/08/23 0000  Discharge wound care:       Comments: 10/08/23 0833    Wound care  Every shift      Comments: Right Mons/Pubis Wound: Pack wound daily with packing strips; cover, secure. This should be done daily and as needed. Okay to remove to shower, replace after. Change superpficial dressing as needed  10/08/23 2130    10/06/23 1156    Wound care  Every shift      Comments: 1.        Cleanse L umbilical wound with Vashe moistened gauze making sure to clean depth, cut a piece of silver hydrofiber and using Q tip applicator pack down into wound daily. May cover with silicone foam or ABD pad if patient desires. May leave open to air.  2.         Clean underneath breasts and axillae with Vashe moistened gauze and apply Kenalog cream 2 times daily as prescribed by MD.  3.         Clean underneath pannus/groin with Vashe moistened gauze.  Place Interdry to sides of pannus.  Apply silver hydrofiber [facility] [place] [phone]) to draining wound on mons, cover with ABD pad which is patients preference.   10/08/23 1102            Follow-up Information     Maxwell Caul, MD. Go on 11/10/2023.   Specialty: Internal Medicine Why: Go at 1:45pm. You will see Baltazar Najjar M.D. Contact information: [address] [place] [address] [phone]         Debera Lat, PA-C Follow up on 10/27/2023.   Specialty: Physician Assistant Why: [facility] Discharge F/UP. Go at 1:20pm. Contact information: [address] [place] [address] [phone]                 Ceasar Mons Weights   10/04/23 1904 10/05/23 0317  Weight: (!) 158.8 kg (!) 166.8 kg     Condition at discharge: fair  The results of significant diagnostics  from this hospitalization (including imaging, microbiology, ancillary and laboratory) are listed below for reference.   Imaging Studies: CT PELVIS W CONTRAST  Result Date: 10/05/2023 CLINICAL DATA:  History of right labial abscess. EXAM: CT PELVIS WITH CONTRAST TECHNIQUE: Multidetector CT imaging of the pelvis was performed using the standard protocol following the bolus administration of intravenous contrast. RADIATION DOSE REDUCTION: This exam was performed according to the departmental dose-optimization program which includes automated exposure control, adjustment of the mA and/or kV according to patient size and/or use of iterative reconstruction technique. CONTRAST:  OMNIPAQUE IOHEXOL 350 MG/ML SOLN COMPARISON:  CT abdomen and pelvis 05/29/2023 FINDINGS: Urinary Tract:  No abnormality visualized. Bowel:  Unremarkable visualized  pelvic bowel loops. Vascular/Lymphatic: No pathologically enlarged lymph nodes. No significant vascular abnormality seen. Reproductive:  No mass or other significant abnormality Other: There is a large midline ventral hernia containing nondilated bowel similar to the prior study. There is diffuse subcutaneous edema with some since skin thickening in the lower aspect of the pannus. There is no evidence for soft tissue gas or focal fluid collection. Musculoskeletal: No suspicious bone lesions identified. IMPRESSION: 1. Diffuse subcutaneous edema with skin thickening in the lower aspect of the pannus. Findings may be related to cellulitis. No evidence for soft tissue gas or focal fluid collection. 2. Large midline ventral hernia containing nondilated bowel. Electronically Signed   By: Darliss Cheney M.D.   On: 10/05/2023 01:14    Microbiology: Results for orders placed or performed during the hospital encounter of 10/04/23  Blood culture (routine x 2)     Status: None (Preliminary result)   Collection Time: 10/04/23  7:08 PM   Specimen: BLOOD  Result Value Ref Range Status    Specimen Description BLOOD BLOOD RIGHT HAND  Final   Special Requests   Final    BOTTLES DRAWN AEROBIC AND ANAEROBIC Blood Culture adequate volume   Culture   Final    NO GROWTH 4 DAYS Performed at [facility], [address] [place], [place] [address]    Report Status PENDING  Incomplete  Blood culture (routine x 2)     Status: None (Preliminary result)   Collection Time: 10/05/23 12:02 AM   Specimen: BLOOD  Result Value Ref Range Status   Specimen Description BLOOD LEFT ANTECUBITAL  Final   Special Requests   Final    BOTTLES DRAWN AEROBIC AND ANAEROBIC Blood Culture results may not be optimal due to an inadequate volume of blood received in culture bottles   Culture   Final    NO GROWTH 3 DAYS Performed at [facility], [address] [place], [place] [address]    Report Status PENDING  Incomplete    Labs: CBC: Recent Labs  Lab 10/04/23 1908 10/06/23 0618 10/07/23 0534 10/08/23 0554  WBC 10.0 9.7 7.0 8.6  NEUTROABS 7.4  --   --   --   HGB 14.6 12.7 12.3 13.6  HCT 41.3 37.3 35.3* 39.7  MCV 81.9 83.1 81.9 82.0  PLT 272 253 254 326   Basic Metabolic Panel: Recent Labs  Lab 10/04/23 1908 10/06/23 0618 10/07/23 0534 10/08/23 0554  NA 135 134* 136 136  K 4.0 3.6 4.1 4.1  CL 102 103 105 104  CO2 23 21* 24 21*  GLUCOSE 134* 127* 115* 117*  BUN 23* 18 21* 20  CREATININE 0.61 0.71 0.70 0.77  CALCIUM 9.0 8.5* 8.7* 9.2   Liver Function Tests: Recent Labs  Lab 10/04/23 1908  AST 18  ALT 18  ALKPHOS 116  BILITOT 1.3*  PROT 8.4*  ALBUMIN 3.4*   CBG: Recent Labs  Lab 10/06/23 2125 10/07/23 0847 10/07/23 1115 10/07/23 1650 10/08/23 0012  GLUCAP 98 89 110* 131* 141*    Discharge time spent: greater than 30 minutes.  Signed: Enedina Finner, MD [facility] 10/08/2023

## 2023-10-08 NOTE — TOC CM/SW Note (Signed)
Transition of Care [facility]) - Inpatient Brief Assessment   Patient Details  Name: Tracy Woods MRN: [id number] Date of Birth: June 20, 1971  Transition of Care [facility]) CM/SW Contact:    Chapman Fitch, RN Phone Number: 10/08/2023, 11:53 AM   Clinical Narrative:  Patient to discharge today Confirmed with bedside RN that patient was educated on wound care by nursing and surgery Per MD no TOC needs at discharge   Transition of Care Asessment: Insurance and Status: Insurance coverage has been reviewed Patient has primary care physician: Yes     Prior/Current Home Services: No current home services Social Determinants of Health Reivew: SDOH reviewed no interventions necessary Readmission risk has been reviewed: Yes Transition of care needs: no transition of care needs at this time

## 2023-10-08 NOTE — Op Note (Signed)
Operative Note Incision and Drainage  Pre-Op Diagnosis: Draining abscess of mons pubis Post-Op Diagnosis: Same Surgeon: Randel Books, M.D. Assistant: None EBL: 2ccs   After informed consent was obtained the mons pubis of the patient was cleaned with Betadine solution.  She had a previous draining punctate wound.  10 cc of 1% lidocaine was infiltrated in the mons to ensure patient comfort.  An incision was made extending from her punctate wound down into the inflammatory tissue.  All loculations were broken up manually.  The abscess cavity was then packed with iodoform solution.  The patient tolerated the procedure well.

## 2023-10-09 LAB — CULTURE, BLOOD (ROUTINE X 2)
Culture: NO GROWTH
Special Requests: ADEQUATE

## 2023-10-10 LAB — CULTURE, BLOOD (ROUTINE X 2): Culture: NO GROWTH

## 2023-10-14 ENCOUNTER — Telehealth: Payer: Self-pay | Admitting: General Surgery

## 2023-10-14 NOTE — Telephone Encounter (Signed)
Patient was seen in ED on 10/04/23 by Dr. Maurine Minister for pilonidal cyst.  She states that she is on her 7th day of packing.  Wants to know what to do from here.  She still has open wound.  Please advise with further instructions.  Please call patient. Thank you.

## 2023-10-15 ENCOUNTER — Ambulatory Visit: Payer: Medicaid Other | Admitting: Physician Assistant

## 2023-10-27 ENCOUNTER — Ambulatory Visit: Payer: Medicaid Other | Admitting: Physician Assistant

## 2023-10-27 NOTE — Progress Notes (Unsigned)
Established patient visit  Patient: Tracy Woods   DOB: 1971-11-03   52 y.o. Female  MRN: [id number] Visit Date: 10/27/2023  Today's healthcare provider: Debera Lat, PA-C   No chief complaint on file.  Subjective       Discussed the use of AI scribe software for clinical note transcription with the patient, who gave verbal consent to proceed.  History of Present Illness               07/28/2023    2:12 PM 06/26/2023    5:02 PM 05/29/2023    1:27 PM  Depression screen PHQ 2/9  Decreased Interest 1 3 3   Down, Depressed, Hopeless 1 3 3   PHQ - 2 Score 2 6 6   Altered sleeping 1 3 2   Tired, decreased energy 1 3 3   Change in appetite 0 3 2  Feeling bad or failure about yourself  1 3 3   Trouble concentrating 1 2 2   Moving slowly or fidgety/restless 0 1 0  Suicidal thoughts 0 1 1  PHQ-9 Score 6 22 19   Difficult doing work/chores   Very difficult      07/28/2023    2:12 PM 06/26/2023    5:02 PM 05/29/2023    1:28 PM 04/29/2023    2:01 PM  GAD 7 : Generalized Anxiety Score  Nervous, Anxious, on Edge 2 3 3 3   Control/stop worrying 2 3 3 3   Worry too much - different things 2 3 2 3   Trouble relaxing 1 3 3 3   Restless 1 0 2 1  Easily annoyed or irritable 1 3 0 2  Afraid - awful might happen 1 3 1 1   Total GAD 7 Score 10 18 14 16   Anxiety Difficulty   Very difficult Very difficult    Medications: Outpatient Medications Prior to Visit  Medication Sig   gabapentin (NEURONTIN) 300 MG capsule Take 1 capsule (300 mg total) by mouth 3 (three) times daily.   HYDROcodone-acetaminophen (NORCO/VICODIN) 5-325 MG tablet Take 1 tablet by mouth every 6 (six) hours as needed for moderate pain (pain score 4-6).   hydrOXYzine (VISTARIL) 50 MG capsule TAKE 1 CAPSULE BY MOUTH THREE TIMES DAILY AS NEEDED   mupirocin ointment (BACTROBAN) 2 % Apply 1 Application topically 2 (two) times daily.   pantoprazole (PROTONIX) 40 MG tablet Take 1 tablet (40 mg total) by mouth daily.    polyethylene glycol (MIRALAX / GLYCOLAX) 17 g packet Take 17 g by mouth 2 (two) times daily.   triamcinolone cream (KENALOG) 0.1 % Apply topically 2 (two) times daily.   No facility-administered medications prior to visit.    Review of Systems  All other systems reviewed and are negative.  All negative Except see HPI   {Insert previous labs (optional):23779} {See past labs  Heme  Chem  Endocrine  Serology  Results Review (optional):1}   Objective    There were no vitals taken for this visit. {Insert last BP/Wt (optional):23777}{See vitals history (optional):1}   Physical Exam Vitals reviewed.  Constitutional:      General: She is not in acute distress.    Appearance: Normal appearance. She is well-developed. She is not diaphoretic.  HENT:     Head: Normocephalic and atraumatic.  Eyes:     General: No scleral icterus.    Conjunctiva/sclera: Conjunctivae normal.  Neck:     Thyroid: No thyromegaly.  Cardiovascular:     Rate and Rhythm: Normal rate and regular rhythm.     Pulses: Normal pulses.  Heart sounds: Normal heart sounds. No murmur heard. Pulmonary:     Effort: Pulmonary effort is normal. No respiratory distress.     Breath sounds: Normal breath sounds. No wheezing, rhonchi or rales.  Musculoskeletal:     Cervical back: Neck supple.     Right lower leg: No edema.     Left lower leg: No edema.  Lymphadenopathy:     Cervical: No cervical adenopathy.  Skin:    General: Skin is warm and dry.     Findings: No rash.  Neurological:     Mental Status: She is alert and oriented to person, place, and time. Mental status is at baseline.  Psychiatric:        Mood and Affect: Mood normal.        Behavior: Behavior normal.      No results found for any visits on 10/27/23.      Assessment and Plan             No orders of the defined types were placed in this encounter.   No follow-ups on file.   The patient was advised to call back or seek an  in-person evaluation if the symptoms worsen or if the condition fails to improve as anticipated.  I discussed the assessment and treatment plan with the patient. The patient was provided an opportunity to ask questions and all were answered. The patient agreed with the plan and demonstrated an understanding of the instructions.  I, Debera Lat, PA-C have reviewed all documentation for this visit. The documentation on 10/27/2023  for the exam, diagnosis, procedures, and orders are all accurate and complete.  Debera Lat, [facility], MMS [facility] [phone] (phone) [phone] (fax)  [facility]

## 2023-10-30 ENCOUNTER — Encounter: Payer: Self-pay | Admitting: Physician Assistant

## 2023-10-30 ENCOUNTER — Telehealth: Payer: Medicaid Other | Admitting: Physician Assistant

## 2023-10-30 ENCOUNTER — Ambulatory Visit: Payer: Self-pay

## 2023-10-30 DIAGNOSIS — N390 Urinary tract infection, site not specified: Secondary | ICD-10-CM

## 2023-10-30 NOTE — Telephone Encounter (Signed)
  Chief Complaint: Recurrent UTI Symptoms: urinary pain, cloudiness, back pain  Frequency: 2 days  Pertinent Negatives: Patient denies urgency or frequency Disposition: [] ED /[] Urgent Care (no appt availability in office) / [] Appointment(In office/virtual)/ []  [facility]/ [] Home Care/ [x] Refused Recommended Disposition /[] [facility]/ [x]  Follow-up with PCP Additional Notes: pt requesting abx for UTI. She states this is recurring issue and doesn't want to appt because she knows this is UTI. Pt doesn't have transportation or money to waste for OV. Advised I would send message to provider for review.   Summary: UTI-Med Request   Pt is calling in because she believes she has a UTI and wants to know if she can get some medication sent in. Pt says she gets them frequently and knows the symptoms and doesn't want to come in for an appointment. Pt would like some medication sent in today if possible.         Reason for Disposition  Side (flank) or lower back pain present  Answer Assessment - Initial Assessment Questions 1. SYMPTOM: "What's the main symptom you're concerned about?" (e.g., frequency, incontinence)     Urinary pain  2. ONSET: "When did the  sx  start?"     2 days 3. PAIN: "Is there any pain?" If Yes, ask: "How bad is it?" (Scale: 1-10; mild, moderate, severe)     yes 4. CAUSE: "What do you think is causing the symptoms?"     UTI 5. OTHER SYMPTOMS: "Do you have any other symptoms?" (e.g., blood in urine, fever, flank pain, pain with urination)     Urinary pain, cloudiness, back pain  Protocols used: Urinary Symptoms-A-AH

## 2023-10-30 NOTE — Progress Notes (Signed)
Because of recurring/frequent UTI and recent antibiotic use raising likelihood of more resistant infection, I feel your condition warrants further evaluation and I recommend that you be seen in a face to face visit. The standard of care in a case like this is to be seen and urine culture obtained before antibiotics started.   NOTE: There will be NO CHARGE for this eVisit   If you are having a true medical emergency please call 911.      For an urgent face to face visit, [facility] has eight urgent care centers for your convenience:   NEW!! [facility]gent Care Center at [facility] Get Driving Directions [phone] [address] [place], [address]    [facility] Health Urgent Care Center at [facility] Get Driving Directions [phone] [address] [place], [place] [address]   [facility] Health Urgent Care Center [facility]) Get Driving Directions [phone] [address] [place], [place] [address]  [facility] Health Urgent Care Center [facility]) Get Driving Directions [phone] [address] [place],  [place]  [address]  [facility] Health Urgent Care Center [facility] - at [employer]  [phone] [address] [place],  [place] [address]   [facility] Health Urgent Care at [facility] Get Driving Directions [phone] [address] [place], [place] [address]   [facility] Health Urgent Care at [facility] Get Driving Directions  [phone] [address] [place], [place] [address]   [facility] Health Urgent Care at [facility] Directions [phone] [address] [place], [place] [address]  Your MyChart E-visit questionnaire answers were reviewed by a board certified advanced clinical practitioner to complete your personal care plan based on your specific symptoms.  Thank you for using e-Visits.

## 2023-10-31 ENCOUNTER — Telehealth (INDEPENDENT_AMBULATORY_CARE_PROVIDER_SITE_OTHER): Payer: Medicaid Other | Admitting: Family Medicine

## 2023-10-31 ENCOUNTER — Encounter: Payer: Self-pay | Admitting: Family Medicine

## 2023-10-31 ENCOUNTER — Ambulatory Visit: Payer: Medicaid Other | Admitting: Podiatry

## 2023-10-31 DIAGNOSIS — R3989 Other symptoms and signs involving the genitourinary system: Secondary | ICD-10-CM | POA: Diagnosis not present

## 2023-10-31 MED ORDER — NITROFURANTOIN MONOHYD MACRO 100 MG PO CAPS: 100.0000 mg | ORAL_CAPSULE | Freq: Two times a day (BID) | ORAL | 0 refills | Status: AC

## 2023-10-31 NOTE — Progress Notes (Signed)
MyChart Video Visit    Virtual Visit via Video Note   This format is felt to be most appropriate for this patient at this time. Physical exam was limited by quality of the video and audio technology used for the visit.    Patient location: home Provider location: [facility] Persons involved in the visit: patient, provider  I discussed the limitations of evaluation and management by telemedicine and the availability of in person appointments. The patient expressed understanding and agreed to proceed.  Patient: Tracy Woods   DOB: 24-Aug-1971   52 y.o. Female  MRN: [id number] Visit Date: 10/31/2023  Today's healthcare provider: Shirlee Latch, MD   No chief complaint on file.  Subjective    HPI   Discussed the use of AI scribe software for clinical note transcription with the patient, who gave verbal consent to proceed.  History of Present Illness   Tracy Woods, a patient with a history of diabetes, recurrent UTIs, and necrotizing fasciitis, presents with symptoms of a UTI that started three days ago. She initially experienced back pain, which she initially attributed to her menstrual cycle. However, she later noticed a burning sensation during urination and cloudy urine. She reports a slight increase in urinary frequency and urgency, but attributes this to increased water intake. She acknowledges that she needs to improve her hydration habits. She has not had recurrent UTIs prior to her major health issue last year, which resulted in an eight-day hospitalization. During that time, she had a catheter inserted and subsequently developed a UTI and a yeast infection. She is currently on antibiotics for cellulitis.       Review of Systems      Objective    There were no vitals taken for this visit.      Physical Exam Constitutional:      General: She is not in acute distress.    Appearance: Normal appearance.  HENT:     Head:  Normocephalic.  Pulmonary:     Effort: Pulmonary effort is normal. No respiratory distress.  Neurological:     Mental Status: She is alert and oriented to person, place, and time. Mental status is at baseline.        Assessment & Plan     Problem List Items Addressed This Visit   None Visit Diagnoses       Suspected UTI    -  Primary           Urinary Tract Infection (UTI) Symptoms began three days ago with back pain, followed by dysuria and cloudy urine. No significant frequency or urgency. Unable to provide urine sample for culture due to transportation issues. Recurrent UTIs, especially post-hospitalization for necrotizing fasciitis, but no positive cultures. Allergic to amoxicillin; recent use of cefuroxime and doxycycline for cellulitis. Macrobid (nitrofurantoin) chosen based on previous antibiotic use and resistance patterns. Improvement expected by day three to four; alternative antibiotics may be needed if no improvement. Advised to take antibiotics with food to avoid gastrointestinal upset. - Prescribe Macrobid (nitrofurantoin) 100 mg twice daily for 7 days - Advise to take antibiotics with food - Monitor symptoms and report if not improved by Monday - Send prescription to [facility] for delivery  Diabetes Mellitus Increases susceptibility to infections, including UTIs. No specific management changes discussed. - Follow up with primary care physician to discuss recurrent UTIs and diabetes management  Follow-up - Schedule follow-up appointment with primary care physician after completing antibiotic course.  Meds ordered this encounter  Medications   nitrofurantoin, macrocrystal-monohydrate, (MACROBID) 100 MG capsule    Sig: Take 1 capsule (100 mg total) by mouth 2 (two) times daily for 7 days.    Dispense:  14 capsule    Refill:  0     Return if symptoms worsen or fail to improve.     I discussed the assessment and treatment plan with the  patient. The patient was provided an opportunity to ask questions and all were answered. The patient agreed with the plan and demonstrated an understanding of the instructions.   The patient was advised to call back or seek an in-person evaluation if the symptoms worsen or if the condition fails to improve as anticipated.   Shirlee Latch, MD [facility] [phone] (phone) [phone] (fax)  [facility]

## 2023-11-09 ENCOUNTER — Other Ambulatory Visit: Payer: Self-pay

## 2023-11-09 ENCOUNTER — Emergency Department
Admission: EM | Admit: 2023-11-09 | Discharge: 2023-11-09 | Disposition: A | Discharge status: Home / Self Care | Payer: Medicaid Other | Attending: Emergency Medicine | Admitting: Emergency Medicine

## 2023-11-09 ENCOUNTER — Emergency Department: Payer: Medicaid Other

## 2023-11-09 DIAGNOSIS — N12 Tubulo-interstitial nephritis, not specified as acute or chronic: Secondary | ICD-10-CM | POA: Insufficient documentation

## 2023-11-09 DIAGNOSIS — R300 Dysuria: Secondary | ICD-10-CM | POA: Diagnosis present

## 2023-11-09 LAB — URINALYSIS, ROUTINE W REFLEX MICROSCOPIC
Bacteria, UA: NONE SEEN
Bilirubin Urine: NEGATIVE
Glucose, UA: NEGATIVE mg/dL
Ketones, ur: NEGATIVE mg/dL
Nitrite: NEGATIVE
Protein, ur: 30 mg/dL — AB
Specific Gravity, Urine: 1.018 (ref 1.005–1.030)
WBC, UA: >50 WBC/hpf (ref 0–5)
pH: 5 (ref 5.0–8.0)

## 2023-11-09 LAB — COMPREHENSIVE METABOLIC PANEL
ALT: 20 U/L (ref 0–44)
AST: 26 U/L (ref 15–41)
Albumin: 3.5 g/dL (ref 3.5–5.0)
Alkaline Phosphatase: 108 U/L (ref 38–126)
Anion gap: 13 (ref 5–15)
BUN: 27 mg/dL — ABNORMAL HIGH (ref 6–20)
CO2: 19 mmol/L — ABNORMAL LOW (ref 22–32)
Calcium: 9.3 mg/dL (ref 8.9–10.3)
Chloride: 103 mmol/L (ref 98–111)
Creatinine, Ser: 0.79 mg/dL (ref 0.44–1.00)
GFR, Estimated: >60 mL/min (ref >60)
Glucose, Bld: 141 mg/dL — ABNORMAL HIGH (ref 70–99)
Potassium: 4 mmol/L (ref 3.5–5.1)
Sodium: 135 mmol/L (ref 135–145)
Total Bilirubin: 0.6 mg/dL (ref <1.2)
Total Protein: 8.1 g/dL (ref 6.5–8.1)

## 2023-11-09 LAB — CBC
HCT: 41.5 % (ref 36.0–46.0)
Hemoglobin: 14 g/dL (ref 12.0–15.0)
MCH: 28.3 pg (ref 26.0–34.0)
MCHC: 33.7 g/dL (ref 30.0–36.0)
MCV: 84 fL (ref 80.0–100.0)
Platelets: 263 10*3/uL (ref 150–400)
RBC: 4.94 MIL/uL (ref 3.87–5.11)
RDW: 12.7 % (ref 11.5–15.5)
WBC: 7.9 10*3/uL (ref 4.0–10.5)
nRBC: 0 % (ref 0.0–0.2)

## 2023-11-09 LAB — LIPASE, BLOOD: Lipase: 32 U/L (ref 11–51)

## 2023-11-09 MED ORDER — SODIUM CHLORIDE 0.9 % IV BOLUS: 1000.0000 mL | Freq: Once | INTRAVENOUS | Status: DC

## 2023-11-09 MED ORDER — SODIUM CHLORIDE 0.9 % IV SOLN
2.0000 g | Freq: Once | INTRAVENOUS | Status: AC
  Administered 2023-11-09: 2 g via INTRAVENOUS
  Filled 2023-11-09: qty 20

## 2023-11-09 MED ORDER — CEFDINIR 300 MG PO CAPS: 300.0000 mg | ORAL_CAPSULE | Freq: Two times a day (BID) | ORAL | 0 refills | Status: AC

## 2023-11-09 MED ORDER — ONDANSETRON 4 MG PO TBDP
4.0000 mg | ORAL_TABLET | Freq: Three times a day (TID) | ORAL | 0 refills | Status: AC | PRN
Start: 2023-11-09 — End: 2023-11-14

## 2023-11-09 NOTE — Discharge Instructions (Addendum)
We are covering you for possible pyelonephritis or an infection of your UTI that spread to your kidney.  Return to the ER if you develop worsening symptoms or any other concerns.  We discussed CT imaging but you have opted declined at this time.  However if develop worsening pain in your side, fevers or any other concerns and please return for repeat evaluation.

## 2023-11-09 NOTE — ED Notes (Addendum)
Pt refused CT scan at this time, requesting to speak to provider first.

## 2023-11-09 NOTE — ED Triage Notes (Signed)
Pt presents to ER via ems from home with c/o painful urination with some possible hematuria.  Pt just finished macrobid on 12/26 for a UTI.  Pt does report some lower abd pain and right side back pain since yesterday.  Denies any new fevers or chills.  Pt is otherwise A&O x4 and in NAD at this time.    EMS VS: 143/103 107 96% RA 98.0 temp  125cbg

## 2023-11-09 NOTE — Progress Notes (Signed)
Pt refused CT, states she's had 3 this year that have not shown anything, and she just needs a UA for recurrent UTI

## 2023-11-09 NOTE — ED Provider Notes (Signed)
[facility] Provider Note    Event Date/Time   First MD Initiated Contact with Patient 11/09/23 [phone]     (approximate)   History   Dysuria   HPI  Tracy Woods is a 52 y.o. female with history of necrotizing fasciitis of her abdominal wall and back in 2023 also status post mons pubis abscess status postdebridement in November who comes in with urinary symptoms.  Patient reports having UTI symptoms.  She reports that her area of recent debridement is healing well.  She denies any current symptoms in regards to this.  She denies any new significant pain.  She states that she has follow-up with her wound care doctor tomorrow.  She has had CT imaging most recently that has been negative for kidney stones on 05/2023 but does have a h.o kidney stone.  She reports that she been on the Macrobid but today had a little bit of blood tinge in the urine color and was worried that she never had a urine culture sent and that maybe her UTI is resistant to the Macrobid therefore she came in today for continued UTI symptoms.    Physical Exam   Triage Vital Signs: ED Triage Vitals  Encounter Vitals Group     BP 11/09/23 0452 (!) 145/104     Systolic BP Percentile --      Diastolic BP Percentile --      Pulse Rate 11/09/23 0452 (!) 102     Resp 11/09/23 0452 20     Temp 11/09/23 0452 98.4 F (36.9 C)     Temp Source 11/09/23 0452 Oral     SpO2 11/09/23 0452 94 %     Weight 11/09/23 0455 (!) 360 lb (163.3 kg)     Height 11/09/23 0455 5\' 6"  (1.676 m)     Head Circumference --      Peak Flow --      Pain Score 11/09/23 0455 2     Pain Loc --      Pain Education --      Exclude from Growth Chart --     Most recent vital signs: Vitals:   11/09/23 0452  BP: (!) 145/104  Pulse: (!) 102  Resp: 20  Temp: 98.4 F (36.9 C)  SpO2: 94%     General: Awake, no distress.  CV:  Good peripheral perfusion.  Resp:  Normal effort.  Abd:  No distention.  Other:  Area was  inspected with debridement noted to the right labia but no new fluctuation or abscess.  Large pannus without any significant redness or abscess noted. PT reports pannus is at baseline.  Umbilical hernia noted which patient reports is at baseline.  ED Results / Procedures / Treatments   Labs (all labs ordered are listed, but only abnormal results are displayed) Labs Reviewed  COMPREHENSIVE METABOLIC PANEL - Abnormal; Notable for the following components:      Result Value   CO2 19 (*)    Glucose, Bld 141 (*)    BUN 27 (*)    All other components within normal limits  URINALYSIS, ROUTINE W REFLEX MICROSCOPIC - Abnormal; Notable for the following components:   Color, Urine YELLOW (*)    APPearance HAZY (*)    Hgb urine dipstick SMALL (*)    Protein, ur 30 (*)    Leukocytes,Ua MODERATE (*)    All other components within normal limits  URINE CULTURE  CBC  LIPASE, BLOOD    PROCEDURES:  Critical Care performed: No  Procedures   MEDICATIONS ORDERED IN ED: Medications  cefTRIAXone (ROCEPHIN) 2 g in sodium chloride 0.9 % 100 mL IVPB (has no administration in time range)     IMPRESSION / MDM / ASSESSMENT AND PLAN / ED COURSE  I reviewed the triage vital signs and the nursing notes.   Patient's presentation is most consistent with acute presentation with potential threat to life or bodily function.   Patient comes in with concerns for UTI symptoms with a little bit of blood in her urine.  She reported a little bit of right flank pain but no significant new discomfort at this time.  We discussed CT imaging I did actually ordered a CT renal from triage but patient had declined the CT imaging.  With patient in the room we discussed the CT imaging how can look for kidney stones.  She has had these previously but again has reported that her most recent CT imaging did not show any.  We also discussed CT imaging with better delineate if there was any new abscess or signs of necrotizing  fasciitis but again patient reports that this is all at baseline and she declines getting a repeat CT scan today.  We discussed that if there was a kidney stone that was stuck that sometimes using to have a stent put in but she does not have any significant flank pain at this time and her biggest concern was the hematuria.  She reports that a lot of the pain that she has is chronic.  Patient reports that she just does not want to have to have another CT unless she really needs to have it.  She prefers to trial p.o. medications outpatient and return if symptoms are worsenin- patient was given some IV antibiotics given she reports that she cannot get to the pharmacy until tomorrow.  She has a wound care appointment tomorrow .  Patient is afebrile her white count is normal.  Initial heart rate was slightly elevated with slightly low bicarb but patient is recheck of heart rate without interventions was 90 and her oxygen level was 98%.  Patient can tolerate p.o. this time she declined CT imaging but will return if she develops fevers, worsening symptoms and would prefer just to be treated for UTI.  Will cover for possible pyelonephritis.     FINAL CLINICAL IMPRESSION(S) / ED DIAGNOSES   Final diagnoses:  Pyelonephritis     Rx / DC Orders   ED Discharge Orders     None        Note:  This document was prepared using Dragon voice recognition software and may include unintentional dictation errors.   Concha Se, MD 11/09/23 [phone]

## 2023-11-10 ENCOUNTER — Ambulatory Visit: Payer: Medicaid Other | Admitting: Internal Medicine

## 2023-11-11 LAB — URINE CULTURE: Culture: >=100000 — AB

## 2023-11-14 ENCOUNTER — Other Ambulatory Visit: Payer: Self-pay | Admitting: Physician Assistant

## 2023-11-14 DIAGNOSIS — B379 Candidiasis, unspecified: Secondary | ICD-10-CM

## 2023-11-14 NOTE — Telephone Encounter (Signed)
 Medication Refill -  Most Recent Primary Care Visit:  Provider: MYRLA JON HERO  Department: [facility]  Visit Type: MYCHART VIDEO VISIT  Date: 10/31/2023  Medication: gabapentin  (NEURONTIN ) 300 MG capsule   hydrOXYzine  (VISTARIL ) 50 MG capsule   Has the patient contacted their pharmacy? No   Is this the correct pharmacy for this prescription? Yes  This is the patient's preferred pharmacy:  [facility] - [place], [place] - [address] [place] [address] Phone: [phone] Fax: [phone]   Has the prescription been filled recently? Yes  Is the patient out of the medication? Yes  Has the patient been seen for an appointment in the last year OR does the patient have an upcoming appointment? Yes  Can we respond through MyChart? Yes  Agent: Please be advised that Rx refills may take up to 3 business days. We ask that you follow-up with your pharmacy.

## 2023-11-17 ENCOUNTER — Other Ambulatory Visit: Payer: Self-pay | Admitting: Physician Assistant

## 2023-11-18 MED ORDER — GABAPENTIN 300 MG PO CAPS: 300.0000 mg | ORAL_CAPSULE | Freq: Three times a day (TID) | ORAL | 3 refills | Status: DC

## 2023-11-18 MED ORDER — HYDROXYZINE PAMOATE 50 MG PO CAPS
50.0000 mg | ORAL_CAPSULE | Freq: Three times a day (TID) | ORAL | 0 refills | Status: DC | PRN
Start: 2023-11-18 — End: 2024-08-03

## 2023-11-18 NOTE — Telephone Encounter (Signed)
 Requested Prescriptions  Pending Prescriptions Disp Refills   hydrOXYzine  (VISTARIL ) 50 MG capsule 270 capsule 0    Sig: Take 1 capsule (50 mg total) by mouth 3 (three) times daily as needed.     Ear, Nose, and Throat:  Antihistamines 2 Passed - 11/18/2023  9:18 AM      Passed - Cr in normal range and within 360 days    Creatinine, Ser  Date Value Ref Range Status  11/09/2023 0.79 0.44 - 1.00 mg/dL Final         Passed - Valid encounter within last 12 months    Recent Outpatient Visits           2 weeks ago Suspected UTI   [facility] [place], Jon HERO, MD   3 months ago Morbid obesity [facility])   [place] [facility] [place], [place], PA-C   4 months ago Type 2 diabetes mellitus with other specified complication, unspecified whether long term insulin  use (HCC)   [place] [facility] [place], J[place], PA-C               gabapentin  (NEURONTIN ) 300 MG capsule 90 capsule 3    Sig: Take 1 capsule (300 mg total) by mouth 3 (three) times daily.     Neurology: Anticonvulsants - gabapentin  Passed - 11/18/2023  9:18 AM      Passed - Cr in normal range and within 360 days    Creatinine, Ser  Date Value Ref Range Status  11/09/2023 0.79 0.44 - 1.00 mg/dL Final         Passed - Completed PHQ-2 or PHQ-9 in the last 360 days      Passed - Valid encounter within last 12 months    Recent Outpatient Visits           2 weeks ago Suspected UTI   [facility] [place], Jon HERO, MD   3 months ago Morbid obesity [facility])   [place] [facility] [place], [place], PA-C   4 months ago Type 2 diabetes mellitus with other specified complication, unspecified whether long term insulin  use [facility])    [facility] [place], J[place], PA-C

## 2023-11-18 NOTE — Telephone Encounter (Signed)
 hydrOXYzine  (VISTARIL ) 50 MG capsule270 capsule01/05/2024--Sig - Route: Take 1 capsule (50 mg total) by mouth 3 (three) times daily as needed. - OralSent to pharmacy as: hydrOXYzine  (VISTARIL ) 50 MG capsuleNotes to Pharmacy: NEED REFILLE-Prescribing Status: Receipt confirmed by pharmacy (11/18/2023  9:19 AM EST)   Requested Prescriptions  Pending Prescriptions Disp Refills   hydrOXYzine  (ATARAX ) 50 MG tablet [Pharmacy Med Name: HYDROXYZINE  HCL 50 MG TAB] 30 tablet     Sig: TAKE 1 TABLET BY MOUTH 3 TIMES DAILY AS NEEDED FOR ITCHING     Ear, Nose, and Throat:  Antihistamines 2 Passed - 11/18/2023  9:44 AM      Passed - Cr in normal range and within 360 days    Creatinine, Ser  Date Value Ref Range Status  11/09/2023 0.79 0.44 - 1.00 mg/dL Final         Passed - Valid encounter within last 12 months    Recent Outpatient Visits           2 weeks ago Suspected UTI   [facility] [place], Jon HERO, MD   3 months ago Morbid obesity [facility])   [facility] [place], [place], PA-C   4 months ago Type 2 diabetes mellitus with other specified complication, unspecified whether long term insulin  use [facility])    [facility] [place], Janna, PA-C

## 2023-11-27 ENCOUNTER — Emergency Department: Payer: Medicaid Other

## 2023-11-27 ENCOUNTER — Other Ambulatory Visit: Payer: Self-pay

## 2023-11-27 ENCOUNTER — Encounter: Payer: Self-pay | Admitting: Emergency Medicine

## 2023-11-27 DIAGNOSIS — G510 Bell's palsy: Secondary | ICD-10-CM | POA: Insufficient documentation

## 2023-11-27 DIAGNOSIS — N3000 Acute cystitis without hematuria: Secondary | ICD-10-CM | POA: Diagnosis not present

## 2023-11-27 DIAGNOSIS — R29810 Facial weakness: Secondary | ICD-10-CM | POA: Diagnosis present

## 2023-11-27 LAB — URINALYSIS, ROUTINE W REFLEX MICROSCOPIC
Bilirubin Urine: NEGATIVE
Glucose, UA: NEGATIVE mg/dL
Ketones, ur: NEGATIVE mg/dL
Nitrite: NEGATIVE
Protein, ur: 30 mg/dL — AB
Specific Gravity, Urine: 1.011 (ref 1.005–1.030)
WBC, UA: >50 WBC/hpf (ref 0–5)
pH: 6 (ref 5.0–8.0)

## 2023-11-27 NOTE — ED Triage Notes (Signed)
First Nurse Note;  Pt via EMS from home. Pt c/o headache for the past 2 days. Pt c/o L sided numbness on her mouth. Stroke screen neg per EMS. Pt is A&Ox4 and NAD 150/98 BP  104 HR  96% on RA 128 CBG  98.5 oral

## 2023-11-27 NOTE — ED Triage Notes (Signed)
Patient to ED via [facility] from home for headache x2 days. PT states she also have left side facial palsy- unable to close eye and move left side of face as well as right. States she first noticed it at 0600 while she was trying to eat. No weakness or numbness other than numbness on left side of face. Patient states LWK 10pm yesterday 11/27/23.  Patient also reports burning with urination and lower abd pain- states hx of recurrent UTI.

## 2023-11-27 NOTE — ED Provider Triage Note (Signed)
Emergency Medicine Provider Triage Evaluation Note  Tracy Woods , a 53 y.o. female  was evaluated in triage.  Pt complains of facial paralysis. She has had headaches for 2 days, facial paralysis started today or last night, patient is unsure. If she had to guess thinks last normal was 10 pm. She reports difficulty opening her mouth this morning which is when she noticed the facial paralysis. Also hard for her to close her left eye.   Also thinks her UTI is back, has had recurrent UTIs and culture.   Review of Systems  Positive: Dysuria, facial drop Negative:   Physical Exam  There were no vitals taken for this visit. Gen:   Awake, no distress   Resp:  Normal effort  MSK:   Moves extremities without difficulty  Other:  Left sided facial droop with involvement of the forehead and left eye, decreased sensation on the left side of the face, strength is equal in upper and lower extremities  Medical Decision Making  Medically screening exam initiated at 5:44 PM.  Appropriate orders placed.  Tracy Woods was informed that the remainder of the evaluation will be completed by another provider, this initial triage assessment does not replace that evaluation, and the importance of remaining in the ED until their evaluation is complete.     Tracy Ali, PA-C 11/27/23 1752

## 2023-11-28 ENCOUNTER — Ambulatory Visit: Payer: Medicaid Other | Admitting: Physician Assistant

## 2023-11-28 ENCOUNTER — Emergency Department
Admission: EM | Admit: 2023-11-28 | Discharge: 2023-11-28 | Disposition: A | Discharge status: Home / Self Care | Payer: Medicaid Other | Attending: Student in an Organized Health Care Education/Training Program | Admitting: Student in an Organized Health Care Education/Training Program

## 2023-11-28 DIAGNOSIS — N3000 Acute cystitis without hematuria: Secondary | ICD-10-CM

## 2023-11-28 DIAGNOSIS — G510 Bell's palsy: Secondary | ICD-10-CM

## 2023-11-28 MED ORDER — ERYTHROMYCIN 5 MG/GM OP OINT: 1.0000 | TOPICAL_OINTMENT | Freq: Every day | OPHTHALMIC | 0 refills | Status: DC

## 2023-11-28 MED ORDER — CIPROFLOXACIN HCL 250 MG PO TABS: 250.0000 mg | ORAL_TABLET | Freq: Two times a day (BID) | ORAL | 0 refills | Status: AC

## 2023-11-28 MED ORDER — VALACYCLOVIR HCL 1 G PO TABS: 1000.0000 mg | ORAL_TABLET | Freq: Three times a day (TID) | ORAL | 0 refills | Status: DC

## 2023-11-28 NOTE — ED Notes (Signed)
See triage notes. Patient c/o possibly Bell's Palsy and UTI.

## 2023-11-28 NOTE — ED Provider Notes (Signed)
[facility] Provider Note    Event Date/Time   First MD Initiated Contact with Patient 11/28/23 0719     (approximate)   History   Headache   HPI  Tracy Woods is a 53 y.o. female who presents to the ER for evaluation of left facial droop as well as some dysuria.  She said that facial droop started last night.  Said that she started noticing some tingling discomfort and left-sided face 2 days ago associated with headache.  No neck stiffness.  No trauma.  No fevers.  Denies any other associated numbness or tingling.  She does have a history of recurrent UTIs just recently completing a course of Omnicef.  Feels like symptoms got better for a little bit and then returned.     Physical Exam   Triage Vital Signs: ED Triage Vitals  Encounter Vitals Group     BP 11/27/23 1750 (!) 166/100     Systolic BP Percentile --      Diastolic BP Percentile --      Pulse Rate 11/27/23 1750 80     Resp 11/27/23 1750 18     Temp 11/27/23 1750 98.3 F (36.8 C)     Temp Source 11/27/23 1750 Oral     SpO2 11/27/23 1750 98 %     Weight 11/27/23 1753 (!) 365 lb (165.6 kg)     Height 11/27/23 1753 5\' 6"  (1.676 m)     Head Circumference --      Peak Flow --      Pain Score 11/27/23 1752 5     Pain Loc --      Pain Education --      Exclude from Growth Chart --     Most recent vital signs: Vitals:   11/28/23 0109 11/28/23 0544  BP: (!) 159/90 (!) 149/126  Pulse: 80 90  Resp: 18 19  Temp: 98.4 F (36.9 C) 97.8 F (36.6 C)  SpO2: 99% 96%     Constitutional: Alert  Eyes: Conjunctivae are normal.  Head: Atraumatic. Nose: No congestion/rhinnorhea. Mouth/Throat: Mucous membranes are moist.   Neck: Painless ROM.  Cardiovascular:   Good peripheral circulation. Respiratory: Normal respiratory effort.  No retractions.  Gastrointestinal: Soft and nontender.  Musculoskeletal:  no deformity Neurologic:  MAE spontaneously.  Left facial palsy involving the left  forehead.  Remainder of cranial nerves intact. Skin:  Skin is warm, dry and intact. No rash noted. Psychiatric: Mood and affect are normal. Speech and behavior are normal.    ED Results / Procedures / Treatments   Labs (all labs ordered are listed, but only abnormal results are displayed) Labs Reviewed  URINALYSIS, ROUTINE W REFLEX MICROSCOPIC - Abnormal; Notable for the following components:      Result Value   Color, Urine YELLOW (*)    APPearance CLOUDY (*)    Hgb urine dipstick LARGE (*)    Protein, ur 30 (*)    Leukocytes,Ua LARGE (*)    Bacteria, UA MANY (*)    All other components within normal limits     EKG     RADIOLOGY Please see ED Course for my review and interpretation.  I personally reviewed all radiographic images ordered to evaluate for the above acute complaints and reviewed radiology reports and findings.  These findings were personally discussed with the patient.  Please see medical record for radiology report.    PROCEDURES:  Critical Care performed:   Procedures   MEDICATIONS ORDERED IN  ED: Medications - No data to display   IMPRESSION / MDM / ASSESSMENT AND PLAN / ED COURSE  I reviewed the triage vital signs and the nursing notes.                              Differential diagnosis includes, but is not limited to, bells palsy, cva, sah, uti, pyelo  Patient presenting to the ER for evaluation of symptoms as described above.  Based on symptoms, risk factors and considered above differential, this presenting complaint could reflect a potentially life-threatening illness therefore the patient will be placed on continuous pulse oximetry and telemetry for monitoring.  Laboratory evaluation will be sent to evaluate for the above complaints.  Patient does have evidence of UTI.  Most recent culture grew Proteus that was resistant to nitrofurantoin.  She just completed a course of Omnicef will switch to Cipro.  She has a history of diabetes.  Her CT  head on my review and interpretation does not show any evidence of CVA or bleed.  Presentation is consistent with Bell's palsy.  Due to her history of diabetes and the patient states that she is noncompliant with monitoring her blood sugar do worried that prednisone likely to cause more harm than improvement.  Will place on antiviral.  Discussed eye care and outpatient follow-up.        FINAL CLINICAL IMPRESSION(S) / ED DIAGNOSES   Final diagnoses:  Bell's palsy  Acute cystitis without hematuria     Rx / DC Orders   ED Discharge Orders          Ordered    valACYclovir (VALTREX) 1000 MG tablet  3 times daily        11/28/23 0731    ciprofloxacin (CIPRO) 250 MG tablet  2 times daily        11/28/23 0731    erythromycin ophthalmic ointment  Daily at bedtime        11/28/23 0731             Note:  This document was prepared using Dragon voice recognition software and may include unintentional dictation errors.    Willy Eddy, MD 11/28/23 [phone]

## 2023-11-29 ENCOUNTER — Telehealth: Payer: Medicaid Other | Admitting: Nurse Practitioner

## 2023-11-29 DIAGNOSIS — G510 Bell's palsy: Secondary | ICD-10-CM

## 2023-11-29 MED ORDER — PREDNISONE 20 MG PO TABS: 60.0000 mg | ORAL_TABLET | Freq: Every day | ORAL | 0 refills | Status: DC

## 2023-11-29 NOTE — Progress Notes (Signed)
Virtual Visit Consent   Tracy Woods, you are scheduled for a virtual visit with a [facility] provider today. Just as with appointments in the office, your consent must be obtained to participate. Your consent will be active for this visit and any virtual visit you may have with one of our providers in the next 365 days. If you have a MyChart account, a copy of this consent can be sent to you electronically.  As this is a virtual visit, video technology does not allow for your provider to perform a traditional examination. This may limit your provider's ability to fully assess your condition. If your provider identifies any concerns that need to be evaluated in person or the need to arrange testing (such as labs, EKG, etc.), we will make arrangements to do so. Although advances in technology are sophisticated, we cannot ensure that it will always work on either your end or our end. If the connection with a video visit is poor, the visit may have to be switched to a telephone visit. With either a video or telephone visit, we are not always able to ensure that we have a secure connection.  By engaging in this virtual visit, you consent to the provision of healthcare and authorize for your insurance to be billed (if applicable) for the services provided during this visit. Depending on your insurance coverage, you may receive a charge related to this service.  I need to obtain your verbal consent now. Are you willing to proceed with your visit today? [name] has provided verbal consent on 11/29/2023 for a virtual visit (video or telephone). [name], NP  Date: 11/29/2023 9:24 AM  Virtual Visit via Video Note   I, [name], connected with  Tracy Woods  ([id number], Aug 30, 1971) on 11/29/23 at  9:15 AM EST by a video-enabled telemedicine application and verified that I am speaking with the correct person using two identifiers.  Location: Patient: Virtual Visit Location  Patient: Home Provider: Virtual Visit Location Provider: Home Office   I discussed the limitations of evaluation and management by telemedicine and the availability of in person appointments. The patient expressed understanding and agreed to proceed.    History of Present Illness: Tracy Woods is a 53 y.o. who identifies as a female who was assigned female at birth, and is being seen today for Bell's palsy.   She was treated in the ED on 11-28-2023 for left sided facial Bells Palsy with Cipro, valacylvir po, erythromycin eye drops. She feels her Bells Palsy is worsening and is requesting prednisone today. States her current symptoms are affecting her speech and eating.      Problems:  Patient Active Problem List   Diagnosis Date Noted   History of abdominal wall necrotizing fasciitis 09/2022 10/05/2023   Ventral hernia without obstruction or gangrene 10/05/2023   Cellulitis of abdominal wall 10/05/2023   Adenomatous polyp of colon 09/02/2023   LLQ abdominal pain 09/02/2023   Ingrown toenail of left foot 07/29/2023   Upslanting toenails 07/29/2023   Urinary frequency 07/29/2023   Elevated cholesterol 06/26/2023   Bilateral lower abdominal discomfort 06/26/2023   Urinary problem in female 06/26/2023   Candidiasis 06/26/2023   Other chronic pain 05/30/2023   Abdominal pain 05/29/2023   Morbid obesity with BMI of 50.0-59.9, adult (HCC) 05/10/2023   Type 2 diabetes mellitus with other specified complication (HCC) 05/10/2023   Dyspnea 05/10/2023   Anxiety and depression 05/10/2023   Neuropathy 05/10/2023   Clinical diagnosis of  COVID-19 11/25/2022   Aftercare following surgery of the skin or subcutaneous tissue 11/18/2022   Anxiety 11/18/2022   Candidal intertrigo 11/18/2022   Colon cancer screening 11/18/2022   History of urinary tract infection 11/18/2022   Pseudomonas (aeruginosa) (mallei) (pseudomallei) as the cause of diseases classified elsewhere 11/18/2022    Necrotizing fasciitis (HCC) 10/24/2022   Severe sepsis with lactic acidosis (HCC) 09/15/2022    Allergies:  Allergies  Allergen Reactions   Amoxicillin Rash    Tolerated Zosyn 09/16/22 with no apparent issue   Medications:  Current Outpatient Medications:    predniSONE (DELTASONE) 20 MG tablet, Take 3 tablets (60 mg total) by mouth daily with breakfast for 7 days., Disp: 21 tablet, Rfl: 0   ciprofloxacin (CIPRO) 250 MG tablet, Take 1 tablet (250 mg total) by mouth 2 (two) times daily for 7 days., Disp: 14 tablet, Rfl: 0   erythromycin ophthalmic ointment, Place 1 Application into the left eye at bedtime., Disp: 3.5 g, Rfl: 0   gabapentin (NEURONTIN) 300 MG capsule, Take 1 capsule (300 mg total) by mouth 3 (three) times daily., Disp: 90 capsule, Rfl: 3   HYDROcodone-acetaminophen (NORCO/VICODIN) 5-325 MG tablet, Take 1 tablet by mouth every 6 (six) hours as needed for moderate pain (pain score 4-6)., Disp: 20 tablet, Rfl: 0   hydrOXYzine (VISTARIL) 50 MG capsule, Take 1 capsule (50 mg total) by mouth 3 (three) times daily as needed., Disp: 270 capsule, Rfl: 0   mupirocin ointment (BACTROBAN) 2 %, Apply 1 Application topically 2 (two) times daily., Disp: 22 g, Rfl: 1   triamcinolone cream (KENALOG) 0.1 %, Apply topically 2 (two) times daily., Disp: 30 g, Rfl: 0   valACYclovir (VALTREX) 1000 MG tablet, Take 1 tablet (1,000 mg total) by mouth 3 (three) times daily for 7 days., Disp: 21 tablet, Rfl: 0  Observations/Objective: Patient is well-developed, well-nourished in no acute distress.  Resting comfortably at home.  Head is normocephalic, atraumatic.  No labored breathing.  Speech is clear and coherent with logical content.  Patient is alert and oriented at baseline.    Assessment and Plan: 1. Palsy, Bell's (Primary) - predniSONE (DELTASONE) 20 MG tablet; Take 3 tablets (60 mg total) by mouth daily with breakfast for 7 days.  Dispense: 21 tablet; Refill: 0    Follow Up  Instructions: I discussed the assessment and treatment plan with the patient. The patient was provided an opportunity to ask questions and all were answered. The patient agreed with the plan and demonstrated an understanding of the instructions.  A copy of instructions were sent to the patient via MyChart unless otherwise noted below.    The patient was advised to call back or seek an in-person evaluation if the symptoms worsen or if the condition fails to improve as anticipated.    [name], NP

## 2023-11-29 NOTE — Patient Instructions (Signed)
  Tracy Woods, thank you for joining Tracy Rigg, NP for today's virtual visit.  While this provider is not your primary care provider (PCP), if your PCP is located in our provider database this encounter information will be shared with them immediately following your visit.   A [facility] MyChart account gives you access to today's visit and all your visits, tests, and labs performed at [facility] " click here if you don't have a Tracy Woods MyChart account or go to [email or web address]  Consent: (Patient) Tracy Woods provided verbal consent for this virtual visit at the beginning of the encounter.  Current Medications:  Current Outpatient Medications:    predniSONE (DELTASONE) 20 MG tablet, Take 3 tablets (60 mg total) by mouth daily with breakfast for 7 days., Disp: 21 tablet, Rfl: 0   ciprofloxacin (CIPRO) 250 MG tablet, Take 1 tablet (250 mg total) by mouth 2 (two) times daily for 7 days., Disp: 14 tablet, Rfl: 0   erythromycin ophthalmic ointment, Place 1 Application into the left eye at bedtime., Disp: 3.5 g, Rfl: 0   gabapentin (NEURONTIN) 300 MG capsule, Take 1 capsule (300 mg total) by mouth 3 (three) times daily., Disp: 90 capsule, Rfl: 3   HYDROcodone-acetaminophen (NORCO/VICODIN) 5-325 MG tablet, Take 1 tablet by mouth every 6 (six) hours as needed for moderate pain (pain score 4-6)., Disp: 20 tablet, Rfl: 0   hydrOXYzine (VISTARIL) 50 MG capsule, Take 1 capsule (50 mg total) by mouth 3 (three) times daily as needed., Disp: 270 capsule, Rfl: 0   mupirocin ointment (BACTROBAN) 2 %, Apply 1 Application topically 2 (two) times daily., Disp: 22 g, Rfl: 1   triamcinolone cream (KENALOG) 0.1 %, Apply topically 2 (two) times daily., Disp: 30 g, Rfl: 0   valACYclovir (VALTREX) 1000 MG tablet, Take 1 tablet (1,000 mg total) by mouth 3 (three) times daily for 7 days., Disp: 21 tablet, Rfl: 0   Medications ordered in this encounter:  Meds ordered this  encounter  Medications   predniSONE (DELTASONE) 20 MG tablet    Sig: Take 3 tablets (60 mg total) by mouth daily with breakfast for 7 days.    Dispense:  21 tablet    Refill:  0    Supervising Provider:   Merrilee Woods [[id number]]     *If you need refills on other medications prior to your next appointment, please contact your pharmacy*  Follow-Up: Call back or seek an in-person evaluation if the symptoms worsen or if the condition fails to improve as anticipated.  [facility] [phone]  Other Instructions Follow up with PCP if no improvement may need to see neurology Needs to monitor blood sugars while on prednisone. Notifiy Md if blood sugars >300   If you have been instructed to have an in-person evaluation today at a local Urgent Care facility, please use the link below. It will take you to a list of all of our available [facility], including address, phone number and hours of operation. Please do not delay care.  [facility]  If you or a family member do not have a primary care provider, use the link below to schedule a visit and establish care. When you choose a [facility] primary care physician or advanced practice provider, you gain a long-term partner in health. Find a Primary Care Provider  Learn more about [facility]'s in-office and virtual care options: [facility] - Get Care Now

## 2023-11-30 ENCOUNTER — Telehealth: Payer: Medicaid Other | Admitting: Physician Assistant

## 2023-11-30 DIAGNOSIS — G510 Bell's palsy: Secondary | ICD-10-CM

## 2023-11-30 NOTE — Progress Notes (Signed)
Virtual Visit Consent   Tracy Woods, you are scheduled for a virtual visit with a [facility] provider today. Just as with appointments in the office, your consent must be obtained to participate. Your consent will be active for this visit and any virtual visit you may have with one of our providers in the next 365 days. If you have a MyChart account, a copy of this consent can be sent to you electronically.  As this is a virtual visit, video technology does not allow for your provider to perform a traditional examination. This may limit your provider's ability to fully assess your condition. If your provider identifies any concerns that need to be evaluated in person or the need to arrange testing (such as labs, EKG, etc.), we will make arrangements to do so. Although advances in technology are sophisticated, we cannot ensure that it will always work on either your end or our end. If the connection with a video visit is poor, the visit may have to be switched to a telephone visit. With either a video or telephone visit, we are not always able to ensure that we have a secure connection.  By engaging in this virtual visit, you consent to the provision of healthcare and authorize for your insurance to be billed (if applicable) for the services provided during this visit. Depending on your insurance coverage, you may receive a charge related to this service.  I need to obtain your verbal consent now. Are you willing to proceed with your visit today? Tracy Woods has provided verbal consent on 11/30/2023 for a virtual visit (video or telephone). Roney Jaffe, PA-C  Date: 11/30/2023 9:12 AM  Virtual Visit via Video Note   I, Tracy Woods, connected with  Tracy Woods  ([id number], 20-Sep-1971) on 11/30/23 at  8:45 AM EST by a video-enabled telemedicine application and verified that I am speaking with the correct person using two identifiers.  Location: Patient: Virtual Visit Location  Patient: Home Provider: Virtual Visit Location Provider: Home Office   I discussed the limitations of evaluation and management by telemedicine and the availability of in person appointments. The patient expressed understanding and agreed to proceed.    History of Present Illness: Tracy Woods is a 53 y.o. who identifies as a female who was assigned female at birth,  with a recent diagnosis of Bell's Palsy, presents with concerns about the side effects of her prescribed medication, Prednisone. She took the first dose last night at midnight and has since experienced elevated blood sugar levels, (223 the highest) which she attributes to the Prednisone.   In addition to her Bell's Palsy and high blood sugar, she also has difficulty keeping her eye closed due to the Bell's Palsy, leading her to cover it. She reports no other new symptoms or changes in her health since her last visit.  She has a history of taking Metformin for her blood sugar, but she stopped taking it due to concerns about side effects. She is considering restarting the Metformin to manage her current elevated blood sugar levels. She has not taken steroids before and is concerned about the potential side effects, particularly the impact on her blood sugar and blood pressure.  HPI: HPI  Problems:  Patient Active Problem List   Diagnosis Date Noted   History of abdominal wall necrotizing fasciitis 09/2022 10/05/2023   Ventral hernia without obstruction or gangrene 10/05/2023   Cellulitis of abdominal wall 10/05/2023   Adenomatous polyp of colon 09/02/2023  LLQ abdominal pain 09/02/2023   Ingrown toenail of left foot 07/29/2023   Upslanting toenails 07/29/2023   Urinary frequency 07/29/2023   Elevated cholesterol 06/26/2023   Bilateral lower abdominal discomfort 06/26/2023   Urinary problem in female 06/26/2023   Candidiasis 06/26/2023   Other chronic pain 05/30/2023   Abdominal pain 05/29/2023   Morbid obesity with BMI  of 50.0-59.9, adult (HCC) 05/10/2023   Type 2 diabetes mellitus with other specified complication (HCC) 05/10/2023   Dyspnea 05/10/2023   Anxiety and depression 05/10/2023   Neuropathy 05/10/2023   Clinical diagnosis of COVID-19 11/25/2022   Aftercare following surgery of the skin or subcutaneous tissue 11/18/2022   Anxiety 11/18/2022   Candidal intertrigo 11/18/2022   Colon cancer screening 11/18/2022   History of urinary tract infection 11/18/2022   Pseudomonas (aeruginosa) (mallei) (pseudomallei) as the cause of diseases classified elsewhere 11/18/2022   Necrotizing fasciitis (HCC) 10/24/2022   Severe sepsis with lactic acidosis (HCC) 09/15/2022    Allergies:  Allergies  Allergen Reactions   Amoxicillin Rash    Tolerated Zosyn 09/16/22 with no apparent issue   Medications:  Current Outpatient Medications:    ciprofloxacin (CIPRO) 250 MG tablet, Take 1 tablet (250 mg total) by mouth 2 (two) times daily for 7 days., Disp: 14 tablet, Rfl: 0   erythromycin ophthalmic ointment, Place 1 Application into the left eye at bedtime., Disp: 3.5 g, Rfl: 0   gabapentin (NEURONTIN) 300 MG capsule, Take 1 capsule (300 mg total) by mouth 3 (three) times daily., Disp: 90 capsule, Rfl: 3   HYDROcodone-acetaminophen (NORCO/VICODIN) 5-325 MG tablet, Take 1 tablet by mouth every 6 (six) hours as needed for moderate pain (pain score 4-6)., Disp: 20 tablet, Rfl: 0   hydrOXYzine (VISTARIL) 50 MG capsule, Take 1 capsule (50 mg total) by mouth 3 (three) times daily as needed., Disp: 270 capsule, Rfl: 0   mupirocin ointment (BACTROBAN) 2 %, Apply 1 Application topically 2 (two) times daily., Disp: 22 g, Rfl: 1   predniSONE (DELTASONE) 20 MG tablet, Take 3 tablets (60 mg total) by mouth daily with breakfast for 7 days., Disp: 21 tablet, Rfl: 0   triamcinolone cream (KENALOG) 0.1 %, Apply topically 2 (two) times daily., Disp: 30 g, Rfl: 0   valACYclovir (VALTREX) 1000 MG tablet, Take 1 tablet (1,000 mg total) by  mouth 3 (three) times daily for 7 days., Disp: 21 tablet, Rfl: 0  Observations/Objective: Patient is well-developed, well-nourished in no acute distress.  Resting comfortably  at home.  Head is normocephalic, atraumatic.  No labored breathing.  Speech is clear and coherent with logical content.  Patient is alert and oriented at baseline.    Assessment and Plan: 1. Palsy, Bell's (Primary)  Recently diagnosed and started on Prednisone and Valtrex. Patient experiencing anxiety related to side effects of Prednisone, including elevated blood sugar and chest discomfort. Discussed the benefits of Prednisone in promoting quicker resolution and better outcome of Bell's Palsy versus the risks of transiently elevated blood sugar and blood pressure. -Continue Prednisone as prescribed. -Advised to maintain a high protein, low carbohydrate diet and increase water intake during the course of Prednisone treatment. -Check fasting blood sugar in the morning only.  Patient has Metformin at home but has not been taking it regularly. -Advised to take Metformin if fasting blood sugar remains elevated. -Advised to maintain a high protein, low carbohydrate diet and increase water intake.    Follow Up Instructions: I discussed the assessment and treatment plan with the patient. The  patient was provided an opportunity to ask questions and all were answered. The patient agreed with the plan and demonstrated an understanding of the instructions.  A copy of instructions were sent to the patient via MyChart unless otherwise noted below.     The patient was advised to call back or seek an in-person evaluation if the symptoms worsen or if the condition fails to improve as anticipated.    Kasandra Knudsen Mayers, PA-C

## 2023-11-30 NOTE — Patient Instructions (Signed)
Tracy Woods, thank you for joining Tracy Jaffe, PA-C for today's virtual visit.  While this provider is not your primary care provider (PCP), if your PCP is located in our provider database this encounter information will be shared with them immediately following your visit.   A [facility] MyChart account gives you access to today's visit and all your visits, tests, and labs performed at [name] " click here if you don't have a [name] account or go to [email or web address]  Consent: (Patient) Tracy Woods provided verbal consent for this virtual visit at the beginning of the encounter.  Current Medications:  Current Outpatient Medications:    ciprofloxacin (CIPRO) 250 MG tablet, Take 1 tablet (250 mg total) by mouth 2 (two) times daily for 7 days., Disp: 14 tablet, Rfl: 0   erythromycin ophthalmic ointment, Place 1 Application into the left eye at bedtime., Disp: 3.5 g, Rfl: 0   gabapentin (NEURONTIN) 300 MG capsule, Take 1 capsule (300 mg total) by mouth 3 (three) times daily., Disp: 90 capsule, Rfl: 3   HYDROcodone-acetaminophen (NORCO/VICODIN) 5-325 MG tablet, Take 1 tablet by mouth every 6 (six) hours as needed for moderate pain (pain score 4-6)., Disp: 20 tablet, Rfl: 0   hydrOXYzine (VISTARIL) 50 MG capsule, Take 1 capsule (50 mg total) by mouth 3 (three) times daily as needed., Disp: 270 capsule, Rfl: 0   mupirocin ointment (BACTROBAN) 2 %, Apply 1 Application topically 2 (two) times daily., Disp: 22 g, Rfl: 1   predniSONE (DELTASONE) 20 MG tablet, Take 3 tablets (60 mg total) by mouth daily with breakfast for 7 days., Disp: 21 tablet, Rfl: 0   triamcinolone cream (KENALOG) 0.1 %, Apply topically 2 (two) times daily., Disp: 30 g, Rfl: 0   valACYclovir (VALTREX) 1000 MG tablet, Take 1 tablet (1,000 mg total) by mouth 3 (three) times daily for 7 days., Disp: 21 tablet, Rfl: 0   Medications ordered in this encounter:  No orders of the defined  types were placed in this encounter.    *If you need refills on other medications prior to your next appointment, please contact your pharmacy*  Follow-Up: Call back or seek an in-person evaluation if the symptoms worsen or if the condition fails to improve as anticipated.  [name] [phone]  Other Instructions Hyperglycemia Hyperglycemia is when the amount of sugar, or glucose, in your blood is too high. High blood sugar can happen if you have diabetes or if you don't have diabetes. It may be an emergency. What are the causes? If you have diabetes, high blood sugar may be caused by: Medicines that increase blood sugar. Not giving yourself enough insulin (if you take it). Being less active than normal. Eating more than planned. Illness, an injury, or an infection. Having surgery. Stress. If you don't have diabetes, high blood sugar may be caused by: Certain medicines, such as steroids or thiazide diuretics. Stress. A bad illness or infection. Having surgery. Diseases of the pancreas. What increases the risk? You're more likely to have high blood sugar if: Someone in your family has diabetes. You're overweight. You aren't active. You have or have had: Prediabetes. Diabetes when pregnant. Polycystic ovarian syndrome (PCOS). What are the signs or symptoms? High blood sugar may not cause symptoms. If you do have symptoms, they may include: Feeling more thirsty than normal. Needing to pee more often than normal. Hunger. Feeling very tired. Blurry eyesight. You may have other symptoms if your high blood sugar  isn't treated. These may include: Pain in your belly. A headache. Weakness. Weight loss that's not planned. A tingling or numb feeling in your hands or feet. Cuts or bruises that heal slowly. How is this diagnosed?  High blood sugar is diagnosed with a blood test. This test tells you how much sugar is in your blood. It's done while you're  having symptoms.  Your health care provider may also do a physical exam, look at your medical history, and do more blood tests. These tests may include: A fasting blood glucose (FBG) test. You can't eat for at least 8 hours before this test. An A1C test. A glucose tolerance test. How is this treated? Treatment may include: Taking medicine to control your blood sugar levels. Changing your medicine or how much you take if you take insulin or other diabetes medicines. Checking your blood sugar more often. Making changes to your daily life. These may include: Being more active. Eating healthier foods. Losing weight. Treating an illness or infection. Stopping or taking less steroids. If your high blood sugar stays high, you may need to be treated in the hospital. Follow these instructions at home: If you have diabetes:  Know the symptoms of high blood sugar. Follow your diabetes care plan. Make sure you: Take insulin and medicines as told. Check your blood sugar as often as told. Eat on time. Do not skip meals. Check your blood sugar before and after you exercise. If you exercise longer or harder than normal, check your blood sugar more often. Follow your sick day plan when you can't eat or drink like normal. Make this plan ahead of time with your provider. Share your diabetes care plan with: Your work or school. The people you live with. Wear an alert bracelet or carry a card that says you have diabetes. General instructions Take medicines only as told by your provider. Drink enough fluid to keep your pee (urine) pale yellow. Make sure you drink enough when you: Exercise. Get sick. Are in hot places. If you drink alcohol: Limit how much you have to: 0-1 drink a day if you're female. 0-2 drinks a day if you're female. Know how much alcohol is in your drink. In the [place] one drink is one 12 oz bottle of beer (355 mL), one 5 oz glass of wine (148 mL), or one 1 oz glass of hard liquor  (44 mL). Manage stress. If you need help with this, ask your provider. Exercise as told. Try to stay at a healthy weight. Keep all follow-up visits. Your provider will want to make sure your high blood sugar is treated. Where to find more information American Diabetes Association (ADA): diabetes.org Contact a health care provider if: You have diabetes and have trouble keeping your blood sugar in the right range. Your blood sugar is at or above 240 mg/dL (08.6 mmol/L) for 2 days in a row. You have high blood sugar often. You have signs of illness, such as: Nausea or vomiting. A headache. A fever. You can't stop vomiting. Get help right away if: Your blood sugar monitor reads "high" even when you're taking insulin. You have trouble breathing. These symptoms may be an emergency. Call 911 right away. Do not wait to see if the symptoms will go away. Do not drive yourself to the hospital. This information is not intended to replace advice given to you by your health care provider. Make sure you discuss any questions you have with your health care provider. Document Revised:  07/31/2023 Document Reviewed: 01/15/2023 Elsevier Patient Education  2024 Elsevier Inc.   If you have been instructed to have an in-person evaluation today at a local Urgent Care facility, please use the link below. It will take you to a list of all of our available [facility], including address, phone number and hours of operation. Please do not delay care.  [facility]  If you or a family member do not have a primary care provider, use the link below to schedule a visit and establish care. When you choose a [facility] primary care physician or advanced practice provider, you gain a long-term partner in health. Find a Primary Care Provider  Learn more about [facility] in-office and virtual care options: [facility] Get Care Now

## 2023-12-01 ENCOUNTER — Encounter: Payer: Self-pay | Admitting: Physician Assistant

## 2023-12-01 DIAGNOSIS — G510 Bell's palsy: Secondary | ICD-10-CM

## 2023-12-04 ENCOUNTER — Ambulatory Visit: Payer: Self-pay | Admitting: *Deleted

## 2023-12-04 NOTE — Telephone Encounter (Signed)
Reason for Disposition  Bell's palsy suspected (i.e., weakness on only one side of the face, developing over hours to days, no other symptoms)  Answer Assessment - Initial Assessment Questions 1. SYMPTOM: "What is the main symptom you are concerned about?" (e.g., weakness, numbness)     Jaw pain- left 2. ONSET: "When did this start?" (minutes, hours, days; while sleeping)     Patient was seen in ED- patient was diagnosed- patient states her pain is worse 3. LAST NORMAL: "When was the last time you (the patient) were normal (no symptoms)?"     11/28/23 4. PATTERN "Does this come and go, or has it been constant since it started?"  "Is it present now?"     Patient is presently treating- she is concerned about pain not better 5. CARDIAC SYMPTOMS: "Have you had any of the following symptoms: chest pain, difficulty breathing, palpitations?"     no 6. NEUROLOGIC SYMPTOMS: "Have you had any of the following symptoms: headache, dizziness, vision loss, double vision, changes in speech, unsteady on your feet?"     Headache vs facial pain, jaw sore- using heat, redness- rosacea flare 7. OTHER SYMPTOMS: "Do you have any other symptoms?"     Patient does not know what to take- she is on "so many pills"  Protocols used: Neurologic Deficit-A-AH

## 2023-12-04 NOTE — Telephone Encounter (Signed)
  Chief Complaint: recent diagnosis of Bells Palsy  Symptoms: patient is experiencing increased jaw pain, facial swelling Frequency: diagnosed on 11/28/23  Disposition: [] ED /[] Urgent Care (no appt availability in office) / [x] Appointment(In office/virtual)/ []  [facility]/ [] Home Care/ [] Refused Recommended Disposition /[] [facility]/ []  Follow-up with PCP Additional Notes: Patient has many concerns regarding her recent diagnosis and treatment plan. Patient states she thinks she is going to need longer prednisone treatment. She is also having a lot of jaw pain. Patient works from home and she has to talk on the phone. Patient will also need referral- PCP is not in office- VV has been scheduled with provider in office to discuss treatment and pain control.

## 2023-12-05 ENCOUNTER — Telehealth: Payer: Medicaid Other | Admitting: Family Medicine

## 2023-12-05 DIAGNOSIS — E119 Type 2 diabetes mellitus without complications: Secondary | ICD-10-CM

## 2023-12-05 DIAGNOSIS — G510 Bell's palsy: Secondary | ICD-10-CM

## 2023-12-05 DIAGNOSIS — Z7984 Long term (current) use of oral hypoglycemic drugs: Secondary | ICD-10-CM | POA: Diagnosis not present

## 2023-12-05 MED ORDER — ACETAMINOPHEN 500 MG PO TABS: 500.0000 mg | ORAL_TABLET | Freq: Three times a day (TID) | ORAL | 0 refills | Status: AC | PRN

## 2023-12-05 MED ORDER — PREDNISONE 10 MG PO TABS: ORAL_TABLET | ORAL | 0 refills | Status: AC

## 2023-12-05 MED ORDER — AMITRIPTYLINE HCL 25 MG PO TABS: 25.0000 mg | ORAL_TABLET | Freq: Every day | ORAL | 0 refills | Status: DC

## 2023-12-05 MED ORDER — VALACYCLOVIR HCL 1 G PO TABS: 1000.0000 mg | ORAL_TABLET | Freq: Three times a day (TID) | ORAL | 0 refills | Status: AC

## 2023-12-05 MED ORDER — CYANOCOBALAMIN 500 MCG PO TABS: 500.0000 ug | ORAL_TABLET | Freq: Every day | ORAL | 12 refills | Status: DC

## 2023-12-05 NOTE — Patient Instructions (Signed)
Marland Kitchen  Please review the attached list of medications and notify my office if there are any errors.   . Please bring all of your medications to every appointment so we can make sure that our medication list is the same as yours.

## 2023-12-05 NOTE — Progress Notes (Signed)
Established patient visit   Patient: Tracy Woods   DOB: 01/08/1971   53 y.o. Female  MRN: [id number] Visit Date: 12/05/2023  Today's healthcare provider: Mila Merry, MD   No chief complaint on file.  Subjective    Discussed the use of AI scribe software for clinical note transcription with the patient, who gave verbal consent to proceed.  History of Present Illness   The patient, with a history of diabetes, neuropathy, and recent necrotizing fasciitis, presents with new onset left-sided facial paralysis. She woke up with the symptom on 11/27/22 and sought immediate medical attention at the ER.  She was initially prescribed valacylovir, as well as cipro for concomitant UTI.   The patient's facial paralysis worsened from 50% to 100% within a day, accompanied by severe pain behind the left ear. She sought a virtual visit and was started on a seven-day course of prednisone, which she believes needs to be extended based on her research. The patient is also experiencing significant pain, which is affecting her ability to work as her job involves constant talking. She reports exhaustion and is seeking advice on pain management and possible interactions with her current medications.  The patient also has concerns about her blood sugar levels due to her diabetes and the use of prednisone. She has resumed taking metformin once daily and is monitoring her blood sugar levels. She is also interested in adding vitamins to her regimen and is inquiring about the possibility of a prescription for a multivitamin or B12 supplement.  The patient is currently on gabapentin for neuropathy, which was a result of nerve damage from a previous operation for necrotizing fasciitis. She also takes hydroxyzine for anxiety and to aid sleep.       Medications: Outpatient Medications Prior to Visit  Medication Sig   metFORMIN (GLUCOPHAGE) 500 MG tablet Take by mouth daily with breakfast.   ciprofloxacin  (CIPRO) 250 MG tablet Take 1 tablet (250 mg total) by mouth 2 (two) times daily for 7 days.   erythromycin ophthalmic ointment Place 1 Application into the left eye at bedtime.   gabapentin (NEURONTIN) 300 MG capsule Take 1 capsule (300 mg total) by mouth 3 (three) times daily.   HYDROcodone-acetaminophen (NORCO/VICODIN) 5-325 MG tablet Take 1 tablet by mouth every 6 (six) hours as needed for moderate pain (pain score 4-6).   hydrOXYzine (VISTARIL) 50 MG capsule Take 1 capsule (50 mg total) by mouth 3 (three) times daily as needed.   mupirocin ointment (BACTROBAN) 2 % Apply 1 Application topically 2 (two) times daily.   triamcinolone cream (KENALOG) 0.1 % Apply topically 2 (two) times daily.   [DISCONTINUED] predniSONE (DELTASONE) 20 MG tablet Take 3 tablets (60 mg total) by mouth daily with breakfast for 7 days.   [DISCONTINUED] valACYclovir (VALTREX) 1000 MG tablet Take 1 tablet (1,000 mg total) by mouth 3 (three) times daily for 7 days.   No facility-administered medications prior to visit.   Review of Systems     Objective    There were no vitals taken for this visit.  Physical Exam  Awake, alert, oriented x 3. In no apparent distress. Paralysis of entire left side face and forehead, normal movement of entire right side.        Assessment & Plan       Bell's Palsy Acute onset of left facial paralysis with associated pain. Currently on antiviral therapy (Valacyclovir) and short course of Prednisone. Expressed concern about the duration of Prednisone therapy  and ongoing pain. -Extend Prednisone therapy with a tapering dose over the next 10 days. -Add Amitriptyline for nerve pain, to be taken at night. -Continue Valacyclovir, refill prescription.   Diabetes Currently on Metformin, blood sugar levels potentially affected by Prednisone therapy. -Continue Metformin, monitor blood sugar levels.  Pain Management Experiencing significant pain due to Bell's Palsy, impacting sleep and  work. -Prescribe Tylenol for pain management.  Vitamin Supplementation Expressed interest in taking B12 and Vitamin C for potential nerve health benefits. -Prescribe multivitamin with B12.  Referral Request for referral to an Ear, Nose, and Throat specialist has already been placed by her PCP      Mila Merry, MD  [facility] [phone] (phone) [phone] (fax)  [facility]

## 2023-12-08 NOTE — Addendum Note (Signed)
Addended by: Debera Lat on: 12/08/2023 02:46 PM   Modules accepted: Orders

## 2023-12-12 ENCOUNTER — Ambulatory Visit: Payer: Medicaid Other | Admitting: Physician Assistant

## 2023-12-12 ENCOUNTER — Encounter: Payer: Self-pay | Admitting: Physician Assistant

## 2023-12-12 VITALS — BP 161/80 | HR 72 | Ht 66.0 in | Wt 370.0 lb

## 2023-12-12 DIAGNOSIS — E1169 Type 2 diabetes mellitus with other specified complication: Secondary | ICD-10-CM

## 2023-12-12 DIAGNOSIS — R300 Dysuria: Secondary | ICD-10-CM | POA: Diagnosis not present

## 2023-12-12 DIAGNOSIS — R030 Elevated blood-pressure reading, without diagnosis of hypertension: Secondary | ICD-10-CM

## 2023-12-12 DIAGNOSIS — Z7984 Long term (current) use of oral hypoglycemic drugs: Secondary | ICD-10-CM

## 2023-12-12 LAB — POCT URINALYSIS DIPSTICK
Bilirubin, UA: NEGATIVE
Blood, UA: NEGATIVE
Glucose, UA: NEGATIVE
Ketones, UA: NEGATIVE
Leukocytes, UA: NEGATIVE
Nitrite, UA: NEGATIVE
Protein, UA: NEGATIVE
Spec Grav, UA: 1.02 (ref 1.010–1.025)
Urobilinogen, UA: NEGATIVE U/dL — AB
pH, UA: 8 (ref 5.0–8.0)

## 2023-12-12 LAB — POCT GLYCOSYLATED HEMOGLOBIN (HGB A1C): Hemoglobin A1C: 6.5 % — AB (ref 4.0–5.6)

## 2023-12-12 NOTE — Progress Notes (Signed)
Established patient visit  Patient: Tracy Woods   DOB: June 26, 1971   53 y.o. Female  MRN: [id number] Visit Date: 12/12/2023  Today's healthcare provider: Debera Lat, PA-C   Chief Complaint  Patient presents with   Follow-up    F/u ER Bells Palsy--still fatigue, not sleeping well.   Subjective     Discussed the use of AI scribe software for clinical note transcription with the patient, who gave verbal consent to proceed.  History of Present Illness   The patient, with a history of Bell's Palsy, diabetes, and recurrent UTIs, presents with ongoing symptoms from Bell's Palsy, including facial paralysis and irritation in the eye. She reports that she has stopped taking prednisone due to concerns about its impact on her blood sugar levels. She has been referred to a neurologist and an ENT specialist,  The patient also reports a history of tinnitus, which has worsened after taking amitriptyline. She has stopped taking this medication as a result. The patient also reports a history of neuropathy, for which she was prescribed gabapentin. She continues to take this medication. Gabapentin helps with pain associated with bell's palsy. The patient also reports recurrent UTIs, which have not resolved despite multiple courses of antibiotics. She expresses concern that this may be due to an anatomical issue.           12/12/2023    1:08 PM 07/28/2023    2:12 PM 06/26/2023    5:02 PM  Depression screen PHQ 2/9  Decreased Interest 1 1 3   Down, Depressed, Hopeless 1 1 3   PHQ - 2 Score 2 2 6   Altered sleeping 2 1 3   Tired, decreased energy 2 1 3   Change in appetite 0 0 3  Feeling bad or failure about yourself  1 1 3   Trouble concentrating 1 1 2   Moving slowly or fidgety/restless 0 0 1  Suicidal thoughts 0 0 1  PHQ-9 Score 8 6 22   Difficult doing work/chores Somewhat difficult        12/12/2023    1:08 PM 07/28/2023    2:12 PM 06/26/2023    5:02 PM 05/29/2023    1:28 PM  GAD 7 :  Generalized Anxiety Score  Nervous, Anxious, on Edge 3 2 3 3   Control/stop worrying 3 2 3 3   Worry too much - different things 3 2 3 2   Trouble relaxing 3 1 3 3   Restless 0 1 0 2  Easily annoyed or irritable 2 1 3  0  Afraid - awful might happen 1 1 3 1   Total GAD 7 Score 15 10 18 14   Anxiety Difficulty Somewhat difficult   Very difficult    Medications: Outpatient Medications Prior to Visit  Medication Sig   acetaminophen (TYLENOL) 500 MG tablet Take 1 tablet (500 mg total) by mouth every 8 (eight) hours as needed.   cyanocobalamin (VITAMIN B12) 500 MCG tablet Take 1 tablet (500 mcg total) by mouth daily.   gabapentin (NEURONTIN) 300 MG capsule Take 1 capsule (300 mg total) by mouth 3 (three) times daily.   HYDROcodone-acetaminophen (NORCO/VICODIN) 5-325 MG tablet Take 1 tablet by mouth every 6 (six) hours as needed for moderate pain (pain score 4-6).   hydrOXYzine (VISTARIL) 50 MG capsule Take 1 capsule (50 mg total) by mouth 3 (three) times daily as needed.   metFORMIN (GLUCOPHAGE) 500 MG tablet Take by mouth daily with breakfast.   mupirocin ointment (BACTROBAN) 2 % Apply 1 Application topically 2 (two) times daily.   triamcinolone cream (  KENALOG) 0.1 % Apply topically 2 (two) times daily.   valACYclovir (VALTREX) 1000 MG tablet Take 1 tablet (1,000 mg total) by mouth 3 (three) times daily for 7 days.   amitriptyline (ELAVIL) 25 MG tablet Take 1 tablet (25 mg total) by mouth at bedtime for 7 days. (Patient not taking: Reported on 12/12/2023)   erythromycin ophthalmic ointment Place 1 Application into the left eye at bedtime. (Patient not taking: Reported on 12/12/2023)   predniSONE (DELTASONE) 10 MG tablet 5 tablets for 2 days, then 4 for 2 days, then 3 for 2 days, then 2 for 2 days, then 1 for 2 days. (Patient not taking: Reported on 12/12/2023)   No facility-administered medications prior to visit.    Review of Systems All negative Except see HPI       Objective    BP (!)  161/80 (BP Location: Right Arm, Patient Position: Sitting, Cuff Size: Large)   Pulse 72   Ht 5\' 6"  (1.676 m)   Wt (!) 370 lb (167.8 kg)   SpO2 98%   BMI 59.72 kg/m     Physical Exam Constitutional:      General: She is not in acute distress.    Appearance: Normal appearance.  HENT:     Head: Normocephalic.  Pulmonary:     Effort: Pulmonary effort is normal. No respiratory distress.  Musculoskeletal:     Comments: Facial droop noted  Neurological:     Mental Status: She is alert and oriented to person, place, and time. Mental status is at baseline.      No results found for any visits on 12/12/23.      Assessment and Plan    Bell's Palsy Persistent facial paralysis for two weeks. Patient has been using artificial tears and erythromycin at night. Prednisone was stopped prematurely after 7 days due to concerns about blood sugar levels. -Resume Prednisone and taper off as directed. -Continue artificial tears and erythromycin at night. -Refer to neurologist for further assessment and possible physical therapy. -Consider neuroimaging if symptoms persist after 4 months.  Diabetes chronic Patient's A1C is 6.5, but blood sugar levels have been elevated, possibly due to Prednisone use. Patient has been taking Metformin 500mg  with breakfast and has adjusted diet. -Continue Metformin and low-carb diet. Consider increasing a dose of Metformin to 1000mg  if she can tolerate it -Monitor blood sugar levels closely while on Prednisone.  Recurrent UTI Patient has had recurrent UTIs over the past year, with symptoms returning shortly after completing antibiotics. Patient has completed another round of antibiotics recently and symptoms are not as severe, but still present. -Order urinalysis to assess current status. -Consider referral to a urologist if symptoms persist.  Elevated bp BP was 161/80 Advised to measure bp at home and bring the BP record to the follow-up Advised low salt  diet, proper hydration, exercise/ Follow-up in one month or sooner if BP stays higher than 140/90 or pt will experience SOB, CP, palpitations, leg swelling, weakness, paresthesias, dizziness, headache.      Orders Placed This Encounter  Procedures   POCT HgB A1C   POCT urinalysis dipstick    No follow-ups on file.   The patient was advised to call back or seek an in-person evaluation if the symptoms worsen or if the condition fails to improve as anticipated.  I discussed the assessment and treatment plan with the patient. The patient was provided an opportunity to ask questions and all were answered. The patient agreed with the plan and  demonstrated an understanding of the instructions.  I, Debera Lat, PA-C have reviewed all documentation for this visit. The documentation on 12/12/2023  for the exam, diagnosis, procedures, and orders are all accurate and complete.  Debera Lat, [facility], MMS [facility] [phone] (phone) [phone] (fax)  [facility]

## 2023-12-14 LAB — URINE CULTURE

## 2023-12-15 ENCOUNTER — Encounter: Payer: Self-pay | Admitting: Physician Assistant

## 2023-12-30 ENCOUNTER — Ambulatory Visit: Payer: Medicaid Other | Admitting: Physician Assistant

## 2024-01-20 ENCOUNTER — Ambulatory Visit (INDEPENDENT_AMBULATORY_CARE_PROVIDER_SITE_OTHER): Payer: Medicaid Other | Admitting: Physician Assistant

## 2024-01-20 VITALS — BP 179/91 | HR 97 | Temp 98.1°F | Resp 16 | Ht 66.0 in | Wt 385.6 lb

## 2024-01-20 DIAGNOSIS — Z7984 Long term (current) use of oral hypoglycemic drugs: Secondary | ICD-10-CM

## 2024-01-20 DIAGNOSIS — I160 Hypertensive urgency: Secondary | ICD-10-CM

## 2024-01-20 DIAGNOSIS — I10 Essential (primary) hypertension: Secondary | ICD-10-CM

## 2024-01-20 DIAGNOSIS — R030 Elevated blood-pressure reading, without diagnosis of hypertension: Secondary | ICD-10-CM

## 2024-01-20 DIAGNOSIS — E1169 Type 2 diabetes mellitus with other specified complication: Secondary | ICD-10-CM

## 2024-01-20 DIAGNOSIS — Z6841 Body Mass Index (BMI) 40.0 and over, adult: Secondary | ICD-10-CM

## 2024-01-20 DIAGNOSIS — G510 Bell's palsy: Secondary | ICD-10-CM

## 2024-01-20 MED ORDER — BLOOD PRESSURE KIT DEVI: 0 refills | Status: DC

## 2024-01-20 MED ORDER — VALSARTAN-HYDROCHLOROTHIAZIDE 80-12.5 MG PO TABS: 1.0000 | ORAL_TABLET | Freq: Every day | ORAL | 3 refills | Status: DC

## 2024-01-20 NOTE — Progress Notes (Incomplete)
 Established patient visit  Patient: Tracy Woods   DOB: 10/13/1971   53 y.o. Female  MRN: [id number] Visit Date: 01/20/2024  Today's healthcare provider: Debera Lat, PA-C   Chief Complaint  Patient presents with   with concerns about high blood pressure and weight gain       Subjective      Discussed the use of AI scribe software for clinical note transcription with the patient, who gave verbal consent to proceed.  History of Present Illness   The patient, with a history of nerve pain and skin problems, presents with concerns about high blood pressure and weight gain. She reports a significant increase in blood pressure, which the doctor identifies as hypertensive urgency. The patient acknowledges recent weight gain, attributing it to a period of depression and reduced physical activity due to pain. She expresses a desire to manage her weight and blood pressure without additional medication, preferring lifestyle changes and the use of previously prescribed Ozempic. The patient also mentions concerns about her liver and kidneys, prompted by changes in her nails and previous abnormal urinalysis results. She has been managing her skin problems with the help of a dermatologist, and reports significant improvement.           01/20/2024    2:41 PM 12/12/2023    1:08 PM 07/28/2023    2:12 PM  Depression screen PHQ 2/9  Decreased Interest 2 1 1   Down, Depressed, Hopeless 1 1 1   PHQ - 2 Score 3 2 2   Altered sleeping 2 2 1   Tired, decreased energy 2 2 1   Change in appetite 1 0 0  Feeling bad or failure about yourself  1 1 1   Trouble concentrating 0 1 1  Moving slowly or fidgety/restless 0 0 0  Suicidal thoughts 0 0 0  PHQ-9 Score 9 8 6   Difficult doing work/chores Somewhat difficult Somewhat difficult       01/20/2024    2:41 PM 12/12/2023    1:08 PM 07/28/2023    2:12 PM 06/26/2023    5:02 PM  GAD 7 : Generalized Anxiety Score  Nervous, Anxious, on Edge 2 3 2 3    Control/stop worrying 2 3 2 3   Worry too much - different things 2 3 2 3   Trouble relaxing 2 3 1 3   Restless 0 0 1 0  Easily annoyed or irritable 2 2 1 3   Afraid - awful might happen 2 1 1 3   Total GAD 7 Score 12 15 10 18   Anxiety Difficulty Somewhat difficult Somewhat difficult      Medications: Outpatient Medications Prior to Visit  Medication Sig  . acetaminophen (TYLENOL) 500 MG tablet Take 1 tablet (500 mg total) by mouth every 8 (eight) hours as needed.  . cyanocobalamin (VITAMIN B12) 500 MCG tablet Take 1 tablet (500 mcg total) by mouth daily.  Marland Kitchen erythromycin ophthalmic ointment Place 1 Application into the left eye at bedtime.  . gabapentin (NEURONTIN) 300 MG capsule Take 1 capsule (300 mg total) by mouth 3 (three) times daily.  Marland Kitchen HYDROcodone-acetaminophen (NORCO/VICODIN) 5-325 MG tablet Take 1 tablet by mouth every 6 (six) hours as needed for moderate pain (pain score 4-6).  . hydrOXYzine (VISTARIL) 50 MG capsule Take 1 capsule (50 mg total) by mouth 3 (three) times daily as needed.  . metFORMIN (GLUCOPHAGE) 500 MG tablet Take by mouth daily with breakfast.  . mupirocin ointment (BACTROBAN) 2 % Apply 1 Application topically 2 (two) times daily.  Marland Kitchen triamcinolone cream (KENALOG)  0.1 % Apply topically 2 (two) times daily.  Marland Kitchen amitriptyline (ELAVIL) 25 MG tablet Take 1 tablet (25 mg total) by mouth at bedtime for 7 days. (Patient not taking: Reported on 12/12/2023)   No facility-administered medications prior to visit.    Review of Systems All negative Except see HPI   {Insert previous labs (optional):23779} {See past labs  Heme  Chem  Endocrine  Serology  Results Review (optional):1}   Objective    BP (!) 179/91 (BP Location: Right Arm, Patient Position: Sitting)   Pulse 97   Temp 98.1 F (36.7 C) (Oral)   Resp 16   Ht 5\' 6"  (1.676 m)   Wt (!) 385 lb 9.6 oz (174.9 kg)   SpO2 97%   BMI 62.24 kg/m  {Insert last BP/Wt (optional):23777}{See vitals history  (optional):1}   Physical Exam Vitals reviewed.  Constitutional:      General: She is not in acute distress.    Appearance: Normal appearance. She is well-developed. She is obese. She is not diaphoretic.  HENT:     Head: Normocephalic and atraumatic.  Eyes:     General: No scleral icterus.    Conjunctiva/sclera: Conjunctivae normal.  Neck:     Thyroid: No thyromegaly.  Cardiovascular:     Rate and Rhythm: Normal rate and regular rhythm.     Pulses: Normal pulses.     Heart sounds: Normal heart sounds. No murmur heard. Pulmonary:     Effort: Pulmonary effort is normal. No respiratory distress.     Breath sounds: Normal breath sounds. No wheezing, rhonchi or rales.  Musculoskeletal:     Cervical back: Neck supple.     Right lower leg: No edema.     Left lower leg: No edema.  Lymphadenopathy:     Cervical: No cervical adenopathy.  Skin:    General: Skin is warm and dry.     Findings: No rash.  Neurological:     Mental Status: She is alert and oriented to person, place, and time. Mental status is at baseline.  Psychiatric:        Mood and Affect: Mood normal.        Behavior: Behavior normal.      No results found for any visits on 01/20/24.      Assessment and Plan    Hypertensive Urgency Blood pressure 179/91. Discussed risks of untreated hypertension and emphasized medication necessity. - Prescribe valsartan with diuretic, start half tablet, transition to full if tolerated. - Order home blood pressure cuff, instruct daily measurements. - Schedule follow-up in two weeks to reassess. -advised low sodium intake and drink plenty of water  Morbid Obesity Interested in Ozempic for weight management. Discussed lab work necessity and prior authorization. - Order liver and kidney function labs before Ozempic. - Re-prescribe Ozempic pending labs and authorization. - Advise visiting American Diabetic Association website for dietary guidance.  Psoriasis Managed by  dermatologist with antibiotics and steroids. Plan to obtain detailed treatment notes. - Request dermatology notes for treatment plan and medication list.  General Health Maintenance Discussed importance of monitoring liver and kidney function, especially with new medications. - Order comprehensive blood work including liver and kidney function tests. - Check TSH levels, consider thyroid ultrasound if indicated.  Follow-up Requires close monitoring of blood pressure and medication response. Coordination with pharmacy and insurance necessary. - Schedule follow-up in two to three weeks. - Ensure access to blood pressure monitoring equipment. - Coordinate with pharmacy for medication and equipment coverage.  Orders Placed This Encounter  Procedures  . TSH  . Lipid panel    Has the patient fasted?:   Yes  . Hemoglobin A1c  . CBC with Differential/Platelet  . Comprehensive metabolic panel    Has the patient fasted?:   Yes    Return in about 2 weeks (around 02/03/2024) for BP f/u.   The patient was advised to call back or seek an in-person evaluation if the symptoms worsen or if the condition fails to improve as anticipated.  I discussed the assessment and treatment plan with the patient. The patient was provided an opportunity to ask questions and all were answered. The patient agreed with the plan and demonstrated an understanding of the instructions.  I, Debera Lat, PA-C have reviewed all documentation for this visit. The documentation on 01/20/2024  for the exam, diagnosis, procedures, and orders are all accurate and complete.  Debera Lat, [facility], MMS [facility] [phone] (phone) [phone] (fax)  [facility]

## 2024-01-20 NOTE — Progress Notes (Unsigned)
 Established patient visit  Patient: Tracy Woods   DOB: 1971/10/09   53 y.o. Female  MRN: [id number] Visit Date: 01/20/2024  Today's healthcare provider: Debera Lat, PA-C   Chief Complaint  Patient presents with   with concerns about high blood pressure and weight gain       Subjective      Discussed the use of AI scribe software for clinical note transcription with the patient, who gave verbal consent to proceed.  History of Present Illness   The patient, with a history of nerve pain and skin problems, presents with concerns about high blood pressure and weight gain. She reports a significant increase in blood pressure, which the doctor identifies as hypertensive urgency. The patient acknowledges recent weight gain, attributing it to a period of depression and reduced physical activity due to pain. She expresses a desire to manage her weight and blood pressure without additional medication, preferring lifestyle changes and the use of previously prescribed Ozempic. The patient also mentions concerns about her liver and kidneys, prompted by changes in her nails and previous abnormal urinalysis results. She has been managing her skin problems with the help of a dermatologist, and reports significant improvement.           01/20/2024    2:41 PM 12/12/2023    1:08 PM 07/28/2023    2:12 PM  Depression screen PHQ 2/9  Decreased Interest 2 1 1   Down, Depressed, Hopeless 1 1 1   PHQ - 2 Score 3 2 2   Altered sleeping 2 2 1   Tired, decreased energy 2 2 1   Change in appetite 1 0 0  Feeling bad or failure about yourself  1 1 1   Trouble concentrating 0 1 1  Moving slowly or fidgety/restless 0 0 0  Suicidal thoughts 0 0 0  PHQ-9 Score 9 8 6   Difficult doing work/chores Somewhat difficult Somewhat difficult       01/20/2024    2:41 PM 12/12/2023    1:08 PM 07/28/2023    2:12 PM 06/26/2023    5:02 PM  GAD 7 : Generalized Anxiety Score  Nervous, Anxious, on Edge 2 3 2 3    Control/stop worrying 2 3 2 3   Worry too much - different things 2 3 2 3   Trouble relaxing 2 3 1 3   Restless 0 0 1 0  Easily annoyed or irritable 2 2 1 3   Afraid - awful might happen 2 1 1 3   Total GAD 7 Score 12 15 10 18   Anxiety Difficulty Somewhat difficult Somewhat difficult      Medications: Outpatient Medications Prior to Visit  Medication Sig   acetaminophen (TYLENOL) 500 MG tablet Take 1 tablet (500 mg total) by mouth every 8 (eight) hours as needed.   cyanocobalamin (VITAMIN B12) 500 MCG tablet Take 1 tablet (500 mcg total) by mouth daily.   erythromycin ophthalmic ointment Place 1 Application into the left eye at bedtime.   gabapentin (NEURONTIN) 300 MG capsule Take 1 capsule (300 mg total) by mouth 3 (three) times daily.   HYDROcodone-acetaminophen (NORCO/VICODIN) 5-325 MG tablet Take 1 tablet by mouth every 6 (six) hours as needed for moderate pain (pain score 4-6).   hydrOXYzine (VISTARIL) 50 MG capsule Take 1 capsule (50 mg total) by mouth 3 (three) times daily as needed.   metFORMIN (GLUCOPHAGE) 500 MG tablet Take by mouth daily with breakfast.   mupirocin ointment (BACTROBAN) 2 % Apply 1 Application topically 2 (two) times daily.   triamcinolone cream (KENALOG)  0.1 % Apply topically 2 (two) times daily.   amitriptyline (ELAVIL) 25 MG tablet Take 1 tablet (25 mg total) by mouth at bedtime for 7 days. (Patient not taking: Reported on 12/12/2023)   No facility-administered medications prior to visit.    Review of Systems All negative Except see HPI       Objective    BP (!) 179/91 (BP Location: Right Arm, Patient Position: Sitting)   Pulse 97   Temp 98.1 F (36.7 C) (Oral)   Resp 16   Ht 5\' 6"  (1.676 m)   Wt (!) 385 lb 9.6 oz (174.9 kg)   SpO2 97%   BMI 62.24 kg/m     Physical Exam Vitals reviewed.  Constitutional:      General: She is not in acute distress.    Appearance: Normal appearance. She is well-developed. She is obese. She is not diaphoretic.   HENT:     Head: Normocephalic and atraumatic.  Eyes:     General: No scleral icterus.    Conjunctiva/sclera: Conjunctivae normal.  Neck:     Thyroid: No thyromegaly.  Cardiovascular:     Rate and Rhythm: Normal rate and regular rhythm.     Pulses: Normal pulses.     Heart sounds: Normal heart sounds. No murmur heard. Pulmonary:     Effort: Pulmonary effort is normal. No respiratory distress.     Breath sounds: Normal breath sounds. No wheezing, rhonchi or rales.  Musculoskeletal:     Cervical back: Neck supple.     Right lower leg: No edema.     Left lower leg: No edema.  Lymphadenopathy:     Cervical: No cervical adenopathy.  Skin:    General: Skin is warm and dry.     Findings: No rash.  Neurological:     Mental Status: She is alert and oriented to person, place, and time. Mental status is at baseline.  Psychiatric:        Mood and Affect: Mood normal.        Behavior: Behavior normal.      No results found for any visits on 01/20/24.      Assessment and Plan    Bell's palsy Slowly improving Pt requested a referral for PT for facial exercises and neuromuscular retraining Managed by Neurology Advised to monitor blood glucose levels closely in diabetic patients Advised to use ophthalmic lubricants or artificial tears to prevent corneal complications if there is incomplete eye closure. Pt preferred not to take amitriptyline due to possible side effects.  Type 2 diabetes mellitus with other specified complication, unspecified whether long term insulin use (HCC) Chronic A1C 6.5, in the past was rx-ed metformin 500mg  with breakfast? Last Rx in 11/2023. Strongly advised to continue Advised low carb diet and regular exercise Rx ozempic 0.25mg  weekly Advised that semaglutide should be counseled on the potential risk for MTC and informed about symptoms of thyroid tumors, such as a mass in the neck, dysphagia, dyspnea, or persistent hoarseness.  Advised that semaglutide  is not recommended for use in patients with a known history of MTC or those who have MEN 2 due to the increased risk of thyroid C-cell tumors Will follow-up  Morbid obesity (HCC)  - TSH - Lipid panel - Hemoglobin A1c - CBC with Differential/Platelet - Comprehensive metabolic panel  Primary hypertension  - Blood Pressure Monitoring (BLOOD PRESSURE KIT) DEVI; Measure BP at home daily  Dispense: 1 each; Refill: 0 - valsartan-hydrochlorothiazide (DIOVAN-HCT) 80-12.5 MG tablet; Take 1 tablet  by mouth daily.  Dispense: 90 tablet; Refill: 3  Hypertensive urgency (Primary) Blood pressure 179/91. Discussed risks of untreated hypertension and emphasized medication necessity. - Prescribe valsartan with diuretic, start half tablet, transition to full if tolerated. - Order home blood pressure cuff, instruct daily measurements. - Schedule follow-up in two weeks to reassess. -advised low sodium intake and drink plenty of water  Morbid Obesity Interested in Ozempic for weight management. Discussed lab work necessity and prior authorization. - Order liver and kidney function labs before Ozempic. - Re-prescribe Ozempic pending labs and authorization. - Advise visiting American Diabetic Association website for dietary guidance.  Candida intertrigo/Psoriasis Managed by dermatologist with antibiotics and steroids. Plan to obtain detailed treatment notes. - Request dermatology notes for treatment plan and medication list.  General Health Maintenance Discussed importance of monitoring liver and kidney function, especially with new medications. - Order comprehensive blood work including liver and kidney function tests. - Check TSH levels, consider thyroid ultrasound if indicated.  Follow-up Requires close monitoring of blood pressure and medication response. Coordination with pharmacy and insurance necessary. - Schedule follow-up in two to three weeks. - Ensure access to blood pressure monitoring  equipment. - Coordinate with pharmacy for medication and equipment coverage.      Orders Placed This Encounter  Procedures   TSH   Lipid panel    Has the patient fasted?:   Yes   Hemoglobin A1c   CBC with Differential/Platelet   Comprehensive metabolic panel    Has the patient fasted?:   Yes    Return in about 2 weeks (around 02/03/2024) for BP f/u.   The patient was advised to call back or seek an in-person evaluation if the symptoms worsen or if the condition fails to improve as anticipated.  I discussed the assessment and treatment plan with the patient. The patient was provided an opportunity to ask questions and all were answered. The patient agreed with the plan and demonstrated an understanding of the instructions.  I, Debera Lat, PA-C have reviewed all documentation for this visit. The documentation on 01/20/2024  for the exam, diagnosis, procedures, and orders are all accurate and complete.  Debera Lat, [facility], MMS [facility] [phone] (phone) [phone] (fax)  [facility]

## 2024-01-21 ENCOUNTER — Encounter: Payer: Self-pay | Admitting: Physician Assistant

## 2024-01-21 LAB — COMPREHENSIVE METABOLIC PANEL
ALT: 24 IU/L (ref 0–32)
AST: 25 IU/L (ref 0–40)
Albumin: 4.2 g/dL (ref 3.8–4.9)
Alkaline Phosphatase: 130 IU/L — ABNORMAL HIGH (ref 44–121)
BUN/Creatinine Ratio: 24 — ABNORMAL HIGH (ref 9–23)
BUN: 19 mg/dL (ref 6–24)
Bilirubin Total: 0.4 mg/dL (ref 0.0–1.2)
CO2: 20 mmol/L (ref 20–29)
Calcium: 9.9 mg/dL (ref 8.7–10.2)
Chloride: 101 mmol/L (ref 96–106)
Creatinine, Ser: 0.8 mg/dL (ref 0.57–1.00)
Globulin, Total: 3.1 g/dL (ref 1.5–4.5)
Glucose: 167 mg/dL — ABNORMAL HIGH (ref 70–99)
Potassium: 4.5 mmol/L (ref 3.5–5.2)
Sodium: 139 mmol/L (ref 134–144)
Total Protein: 7.3 g/dL (ref 6.0–8.5)
eGFR: 88 mL/min/{1.73_m2} (ref >59)

## 2024-01-21 LAB — CBC WITH DIFFERENTIAL/PLATELET
Basophils Absolute: 0 10*3/uL (ref 0.0–0.2)
Basos: 1 %
EOS (ABSOLUTE): 0.1 10*3/uL (ref 0.0–0.4)
Eos: 2 %
Hematocrit: 45.5 % (ref 34.0–46.6)
Hemoglobin: 15.1 g/dL (ref 11.1–15.9)
Immature Grans (Abs): 0 10*3/uL (ref 0.0–0.1)
Immature Granulocytes: 0 %
Lymphocytes Absolute: 2.1 10*3/uL (ref 0.7–3.1)
Lymphs: 34 %
MCH: 28.4 pg (ref 26.6–33.0)
MCHC: 33.2 g/dL (ref 31.5–35.7)
MCV: 86 fL (ref 79–97)
Monocytes Absolute: 0.4 10*3/uL (ref 0.1–0.9)
Monocytes: 7 %
Neutrophils Absolute: 3.6 10*3/uL (ref 1.4–7.0)
Neutrophils: 56 %
Platelets: 257 10*3/uL (ref 150–450)
RBC: 5.31 x10E6/uL — ABNORMAL HIGH (ref 3.77–5.28)
RDW: 13 % (ref 11.7–15.4)
WBC: 6.2 10*3/uL (ref 3.4–10.8)

## 2024-01-21 LAB — HEMOGLOBIN A1C
Est. average glucose Bld gHb Est-mCnc: 157 mg/dL
Hgb A1c MFr Bld: 7.1 % — ABNORMAL HIGH (ref 4.8–5.6)

## 2024-01-21 LAB — LIPID PANEL
Chol/HDL Ratio: 4.9 ratio — ABNORMAL HIGH (ref 0.0–4.4)
Cholesterol, Total: 276 mg/dL — ABNORMAL HIGH (ref 100–199)
HDL: 56 mg/dL (ref >39)
LDL Chol Calc (NIH): 185 mg/dL — ABNORMAL HIGH (ref 0–99)
Triglycerides: 190 mg/dL — ABNORMAL HIGH (ref 0–149)
VLDL Cholesterol Cal: 35 mg/dL (ref 5–40)

## 2024-01-21 LAB — TSH: TSH: 4.29 u[IU]/mL (ref 0.450–4.500)

## 2024-01-21 MED ORDER — OZEMPIC (0.25 OR 0.5 MG/DOSE) 2 MG/3ML ~~LOC~~ SOPN: PEN_INJECTOR | SUBCUTANEOUS | 1 refills | Status: DC

## 2024-01-22 ENCOUNTER — Encounter: Payer: Self-pay | Admitting: Physician Assistant

## 2024-01-22 ENCOUNTER — Other Ambulatory Visit: Payer: Self-pay | Admitting: Physician Assistant

## 2024-01-22 DIAGNOSIS — E1169 Type 2 diabetes mellitus with other specified complication: Secondary | ICD-10-CM

## 2024-01-22 MED ORDER — ROSUVASTATIN CALCIUM 5 MG PO TABS: 5.0000 mg | ORAL_TABLET | Freq: Every day | ORAL | 3 refills | Status: DC

## 2024-01-22 NOTE — Telephone Encounter (Signed)
**Note De-identified  [place] Obfuscation** Please advise 

## 2024-01-29 ENCOUNTER — Ambulatory Visit: Attending: Neurology | Admitting: Physical Therapy

## 2024-01-29 ENCOUNTER — Other Ambulatory Visit: Payer: Self-pay

## 2024-01-29 DIAGNOSIS — M542 Cervicalgia: Secondary | ICD-10-CM | POA: Diagnosis present

## 2024-01-29 DIAGNOSIS — M2652 Limited mandibular range of motion: Secondary | ICD-10-CM | POA: Diagnosis present

## 2024-01-29 DIAGNOSIS — R29810 Facial weakness: Secondary | ICD-10-CM | POA: Diagnosis present

## 2024-01-29 NOTE — Therapy (Unsigned)
 OUTPATIENT PHYSICAL THERAPY CERVICAL EVALUATION   Patient Name: Tracy Woods MRN: [id number] DOB:[birth year], 53 y.o., female Today's Date: 01/29/2024  END OF SESSION:  PT End of Session - 01/29/24 1536     Visit Number 1    Number of Visits 16    PT Start Time 1538    PT Stop Time 1615    PT Time Calculation (min) 37 min    Behavior During Therapy [facility] for tasks assessed/performed             Past Medical History:  Diagnosis Date   Anxiety and depression 05/10/2023   Candidiasis 06/26/2023   Diabetes mellitus without complication (HCC)    Elevated cholesterol 06/26/2023   History of urinary tract infection 11/18/2022   Hypertension    Morbid obesity (HCC) 05/10/2023   Necrotizing fasciitis (HCC) 10/24/2022   Neuropathy 05/10/2023   Other chronic pain 05/30/2023   Past Surgical History:  Procedure Laterality Date   COLONOSCOPY WITH PROPOFOL N/A 09/02/2023   Procedure: COLONOSCOPY WITH PROPOFOL;  Surgeon: Wyline Mood, MD;  Location: [facility] ENDOSCOPY;  Service: Gastroenterology;  Laterality: N/A;   ESOPHAGOGASTRODUODENOSCOPY (EGD) WITH PROPOFOL N/A 09/02/2023   Procedure: ESOPHAGOGASTRODUODENOSCOPY (EGD) WITH PROPOFOL;  Surgeon: Wyline Mood, MD;  Location: [facility] ENDOSCOPY;  Service: Gastroenterology;  Laterality: N/A;   INCISION AND DRAINAGE OF WOUND Bilateral 09/15/2022   Procedure: IRRIGATION AND DEBRIDEMENT WOUND;  Surgeon: Carolan Shiver, MD;  Location: [facility] ORS;  Service: General;  Laterality: Bilateral;   POLYPECTOMY  09/02/2023   Procedure: POLYPECTOMY;  Surgeon: Wyline Mood, MD;  Location: [facility] ENDOSCOPY;  Service: Gastroenterology;;   TONSILLECTOMY     Patient Active Problem List   Diagnosis Date Noted   History of abdominal wall necrotizing fasciitis 09/2022 10/05/2023   Ventral hernia without obstruction or gangrene 10/05/2023   Cellulitis of abdominal wall 10/05/2023   Adenomatous polyp of colon 09/02/2023   LLQ abdominal pain 09/02/2023    Ingrown toenail of left foot 07/29/2023   Upslanting toenails 07/29/2023   Urinary frequency 07/29/2023   Elevated cholesterol 06/26/2023   Bilateral lower abdominal discomfort 06/26/2023   Urinary problem in female 06/26/2023   Candidiasis 06/26/2023   Other chronic pain 05/30/2023   Abdominal pain 05/29/2023   Morbid obesity with BMI of 50.0-59.9, adult (HCC) 05/10/2023   Type 2 diabetes mellitus with other specified complication (HCC) 05/10/2023   Dyspnea 05/10/2023   Anxiety and depression 05/10/2023   Neuropathy 05/10/2023   Clinical diagnosis of COVID-19 11/25/2022   Aftercare following surgery of the skin or subcutaneous tissue 11/18/2022   Anxiety 11/18/2022   Candidal intertrigo 11/18/2022   Colon cancer screening 11/18/2022   History of urinary tract infection 11/18/2022   Pseudomonas (aeruginosa) (mallei) (pseudomallei) as the cause of diseases classified elsewhere 11/18/2022   Necrotizing fasciitis (HCC) 10/24/2022   Severe sepsis with lactic acidosis (HCC) 09/15/2022    PCP: ***  REFERRING PROVIDER: ***  REFERRING DIAG: ***  THERAPY DIAG:  No diagnosis found.  Rationale for Evaluation and Treatment: {HABREHAB:27488}  ONSET DATE: ***  SUBJECTIVE:  SUBJECTIVE STATEMENT:  Pt reports that she has Bells Palsy. Went to ED for facial weakness and was diagnosed with Bell's palsy. States that weakness increased for roughly 1 month and then has slowy improved. Reports that she Pain was present and severe initially, but has resolved. Has been performing massage and taken predisone to manage swelling in   Hand dominance: {MISC; OT HAND [phone]}  PERTINENT HISTORY:  From recent MD visit:  "patient with recent ER visit on 11/28/2023 for Bell's Palsy and UTI.  Patient presented to the ER with left-sided facial droop, facial tingling, and a headache for two days. Did a steroid pack. Was started on Amitriptyline 25 mg for nerve pain, but discontinued due to worsening tinnitus. Continues to have Bells's Palsy. Unchanged left sided facial pain and tingling since diagnosis. Notes difficulty with speaking and eating. Symptoms are affecting her ability to work as her job involves constant talking. Has difficulty keeping her left eye closed, uses eye drops and gel to manage dry eye. Endorses difficulty with hearing in the left ear. Describes tension and tightness in the left shoulder. Shooting pain and tingling in left leg has resolved at this time. Muscle spasms in bilateral legs has resolved at this time. Abdominal pain has resolved at this time. Sleep has improved, gets around 7-8 hours of sleep. Mood is ok. Difficulty walking, ambulating with a walker in the clinic today."   PAIN:  Are you having pain? No  PRECAUTIONS: {Therapy precautions:24002}  RED FLAGS: {PT Red Flags:29287}     WEIGHT BEARING RESTRICTIONS: No  FALLS:  Has patient fallen in last 6 months? No  LIVING ENVIRONMENT: Lives with: lives alone Lives in: House/apartment Stairs: Yes: External: 3 steps; none Has following equipment at home: Dan Humphreys - 2 wheeled and [employer] - 4 wheeled RW in [employer] in the home.   OCCUPATION: work from home.   PLOF: Independent with household mobility with device and Independent with community mobility with device  PATIENT GOALS: ***  NEXT MD VISIT: ***  OBJECTIVE:  Note: Objective measures were completed at Evaluation unless otherwise noted.  DIAGNOSTIC FINDINGS:  IMPRESSION: Negative non contrasted CT appearance of the brain.    PATIENT SURVEYS:  {rehab surveys:24030}  COGNITION: Overall cognitive status: {cognition:24006}  SENSATION: WFL  POSTURE: {posture:25561}  PALPATION: ***   CERVICAL ROM:   Active ROM A/PROM  (deg) eval  Flexion 34  Extension 42  Right lateral flexion 25  Left lateral flexion 25  Right rotation 45  Left rotation 45   (Blank rows = not tested)  UPPER EXTREMITY ROM:  {AROM/PROM:27142} ROM Right eval Left eval  Shoulder flexion    Shoulder extension    Shoulder abduction    Shoulder adduction    Shoulder extension    Shoulder internal rotation    Shoulder external rotation    Elbow flexion    Elbow extension    Wrist flexion    Wrist extension    Wrist ulnar deviation    Wrist radial deviation    Wrist pronation    Wrist supination     (Blank rows = not tested)  UPPER EXTREMITY MMT:  MMT Right eval Left eval  Shoulder flexion 4 4  Shoulder extension 4 4  Shoulder abduction 4+ 4  Shoulder adduction    Shoulder extension    Shoulder internal rotation 4 4-  Shoulder external rotation 4 4-  Middle trapezius    Lower trapezius    Elbow flexion    Elbow extension  Wrist flexion    Wrist extension    Wrist ulnar deviation    Wrist radial deviation    Wrist pronation    Wrist supination    Grip strength     (Blank rows = not tested)  CERVICAL SPECIAL TESTS:  {Cervical special tests:25246}  FUNCTIONAL TESTS:  {Functional tests:24029}   Eye brows: 25 % on the L Lid closure: 75 %  Smile: unable to perform on the L Puff: slight hold, than lost.  Frown 25 %  Opening: LIPs deviated R.  Tongue mild Veer to the L, but full ROM in all directions.  Pucker. Mild veer to the R    TREATMENT DATE: ***                                                                                                                                 PATIENT EDUCATION:  Education details: *** Person educated: {Person educated:25204} Education method: {Education Method:25205} Education comprehension: {Education Comprehension:25206}  HOME EXERCISE PROGRAM: Access Code: [email or web address] URL: [email or web address] Date: 01/29/2024 Prepared by: Grier Rocher  Exercises - Pucker Lips  - 1 x daily - 7 x weekly - 3 sets - 10 reps - Grinning  - 1 x daily - 7 x weekly - 3 sets - 10 reps - Eyebrow Raise  - 1 x daily - 7 x weekly - 3 sets - 10 reps - Lower Cheek Self Massage  - 1 x daily - 7 x weekly - 3 sets - 3 reps - 20 hold - Chin Self Massage  - 1 x daily - 7 x weekly - 3 sets - 10 reps - 10 hold - Sucking In Cheeks  - 1 x daily - 7 x weekly - 3 sets - 10 reps  ASSESSMENT:  CLINICAL IMPRESSION: Patient is a *** y.o. *** who was seen today for physical therapy evaluation and treatment for ***.   OBJECTIVE IMPAIRMENTS: {opptimpairments:25111}.   ACTIVITY LIMITATIONS: {activitylimitations:27494}  PARTICIPATION LIMITATIONS: {participationrestrictions:25113}  PERSONAL FACTORS: {Personal factors:25162} are also affecting patient's functional outcome.   REHAB POTENTIAL: {rehabpotential:25112}  CLINICAL DECISION MAKING: {clinical decision making:25114}  EVALUATION COMPLEXITY: {Evaluation complexity:25115}   GOALS: Goals reviewed with patient? {yes/no:20286}  SHORT TERM GOALS: Target date: ***  *** Baseline:  Goal status: INITIAL  2.  *** Baseline:  Goal status: INITIAL  3.  *** Baseline:  Goal status: INITIAL  4.  *** Baseline:  Goal status: INITIAL  5.  *** Baseline:  Goal status: INITIAL  6.  *** Baseline:  Goal status: INITIAL  LONG TERM GOALS: Target date: ***  *** Baseline:  Goal status: INITIAL  2.  *** Baseline:  Goal status: INITIAL  3.  *** Baseline:  Goal status: INITIAL  4.  *** Baseline:  Goal status: INITIAL  5.  *** Baseline:  Goal status: INITIAL  6.  *** Baseline:  Goal status: INITIAL   PLAN:  PT FREQUENCY: {rehab frequency:25116}  PT  DURATION: {rehab duration:25117}  PLANNED INTERVENTIONS: {rehab planned interventions:25118::"97110-Therapeutic exercises","97530- Therapeutic [phone]- Neuromuscular re-education","97535- Self JXBJ","47829- Manual  therapy"}  PLAN FOR NEXT SESSION: ***   Golden Pop, PT 01/29/2024, 3:40 PM

## 2024-02-05 ENCOUNTER — Ambulatory Visit: Admitting: Physical Therapy

## 2024-02-12 ENCOUNTER — Ambulatory Visit

## 2024-02-19 ENCOUNTER — Ambulatory Visit: Admitting: Physical Therapy

## 2024-02-26 ENCOUNTER — Ambulatory Visit: Admitting: Physical Therapy

## 2024-03-04 ENCOUNTER — Ambulatory Visit: Admitting: Physical Therapy

## 2024-03-11 ENCOUNTER — Ambulatory Visit: Admitting: Physical Therapy

## 2024-03-18 ENCOUNTER — Ambulatory Visit: Admitting: Physical Therapy

## 2024-03-25 ENCOUNTER — Ambulatory Visit: Admitting: Physical Therapy

## 2024-04-01 ENCOUNTER — Ambulatory Visit: Admitting: Physical Therapy

## 2024-04-08 ENCOUNTER — Ambulatory Visit: Admitting: Physical Therapy

## 2024-04-15 ENCOUNTER — Ambulatory Visit: Admitting: Physical Therapy

## 2024-04-22 ENCOUNTER — Ambulatory Visit: Admitting: Physical Therapy

## 2024-04-29 ENCOUNTER — Ambulatory Visit: Admitting: Physical Therapy

## 2024-05-06 ENCOUNTER — Ambulatory Visit: Admitting: Physical Therapy

## 2024-05-13 ENCOUNTER — Ambulatory Visit: Admitting: Physical Therapy

## 2024-05-20 ENCOUNTER — Ambulatory Visit: Admitting: Physical Therapy

## 2024-05-27 ENCOUNTER — Ambulatory Visit: Admitting: Physical Therapy

## 2024-06-03 ENCOUNTER — Ambulatory Visit: Admitting: Physical Therapy

## 2024-06-10 ENCOUNTER — Ambulatory Visit: Admitting: Physical Therapy

## 2024-06-17 ENCOUNTER — Ambulatory Visit: Admitting: Physical Therapy

## 2024-06-22 ENCOUNTER — Telehealth: Payer: Self-pay

## 2024-06-22 ENCOUNTER — Other Ambulatory Visit (HOSPITAL_COMMUNITY): Payer: Self-pay

## 2024-06-22 NOTE — Telephone Encounter (Signed)
 Pharmacy Patient Advocate Encounter   Received notification from CoverMyMeds that prior authorization for Ozempic  0.25mg  or 0.5mg  is required/requested.   Insurance verification completed.   The patient is insured through [facility] .   Per test claim: The current 28 day co-pay is, $4.00.  No PA needed at this time. This test claim was processed through [facility]- copay amounts may vary at other pharmacies due to pharmacy/plan contracts, or as the patient moves through the different stages of their insurance plan.

## 2024-06-24 ENCOUNTER — Ambulatory Visit: Admitting: Physical Therapy

## 2024-06-25 ENCOUNTER — Other Ambulatory Visit (HOSPITAL_COMMUNITY): Payer: Self-pay

## 2024-07-28 ENCOUNTER — Other Ambulatory Visit: Payer: Self-pay | Admitting: Physician Assistant

## 2024-07-28 DIAGNOSIS — B379 Candidiasis, unspecified: Secondary | ICD-10-CM

## 2024-07-30 ENCOUNTER — Telehealth: Payer: Self-pay

## 2024-07-30 NOTE — Telephone Encounter (Signed)
 Copied from CRM #[id number]. Topic: Clinical - Prescription Issue >> Jul 30, 2024 12:45 PM [name] wrote: Reason for CRM: Patient states that the pharmacy still has not received: gabapentin  (NEURONTIN ) 300 MG capsule hydrOXYzine  (VISTARIL ) 50 MG capsule  She recently called and was told it the refill was on a pending status but now its going to be more than 3 days without any word. She states she cannot go a day without the medication.

## 2024-08-03 ENCOUNTER — Other Ambulatory Visit: Payer: Self-pay

## 2024-08-03 DIAGNOSIS — B379 Candidiasis, unspecified: Secondary | ICD-10-CM

## 2024-08-03 MED ORDER — GABAPENTIN 300 MG PO CAPS: 300.0000 mg | ORAL_CAPSULE | Freq: Every day | ORAL | 3 refills | Status: AC

## 2024-08-03 MED ORDER — HYDROXYZINE PAMOATE 50 MG PO CAPS
50.0000 mg | ORAL_CAPSULE | Freq: Three times a day (TID) | ORAL | 0 refills | Status: AC | PRN
Start: 2024-08-03 — End: ?

## 2024-08-13 ENCOUNTER — Emergency Department: Admission: EM | Admit: 2024-08-13 | Discharge: 2024-08-13 | Disposition: A | Discharge status: Home / Self Care

## 2024-08-13 ENCOUNTER — Other Ambulatory Visit: Payer: Self-pay

## 2024-08-13 ENCOUNTER — Emergency Department

## 2024-08-13 DIAGNOSIS — R7989 Other specified abnormal findings of blood chemistry: Secondary | ICD-10-CM | POA: Diagnosis not present

## 2024-08-13 DIAGNOSIS — E119 Type 2 diabetes mellitus without complications: Secondary | ICD-10-CM | POA: Insufficient documentation

## 2024-08-13 DIAGNOSIS — N390 Urinary tract infection, site not specified: Secondary | ICD-10-CM | POA: Diagnosis not present

## 2024-08-13 DIAGNOSIS — R1011 Right upper quadrant pain: Secondary | ICD-10-CM | POA: Diagnosis present

## 2024-08-13 DIAGNOSIS — R110 Nausea: Secondary | ICD-10-CM | POA: Diagnosis not present

## 2024-08-13 DIAGNOSIS — L03311 Cellulitis of abdominal wall: Secondary | ICD-10-CM | POA: Diagnosis not present

## 2024-08-13 DIAGNOSIS — R109 Unspecified abdominal pain: Secondary | ICD-10-CM

## 2024-08-13 LAB — URINALYSIS, COMPLETE (UACMP) WITH MICROSCOPIC
Bilirubin Urine: NEGATIVE
Glucose, UA: NEGATIVE mg/dL
Hgb urine dipstick: NEGATIVE
Ketones, ur: NEGATIVE mg/dL
Leukocytes,Ua: NEGATIVE
Nitrite: POSITIVE — AB
Protein, ur: 30 mg/dL — AB
Specific Gravity, Urine: 1.04 — ABNORMAL HIGH (ref 1.005–1.030)
pH: 6 (ref 5.0–8.0)

## 2024-08-13 LAB — COMPREHENSIVE METABOLIC PANEL WITH GFR
ALT: 20 U/L (ref 0–44)
AST: 34 U/L (ref 15–41)
Albumin: 2.9 g/dL — ABNORMAL LOW (ref 3.5–5.0)
Alkaline Phosphatase: 85 U/L (ref 38–126)
Anion gap: 12 (ref 5–15)
BUN: 17 mg/dL (ref 6–20)
CO2: 25 mmol/L (ref 22–32)
Calcium: 9.1 mg/dL (ref 8.9–10.3)
Chloride: 101 mmol/L (ref 98–111)
Creatinine, Ser: 0.7 mg/dL (ref 0.44–1.00)
GFR, Estimated: >60 mL/min (ref >60)
Glucose, Bld: 227 mg/dL — ABNORMAL HIGH (ref 70–99)
Potassium: 4.7 mmol/L (ref 3.5–5.1)
Sodium: 138 mmol/L (ref 135–145)
Total Bilirubin: 0.5 mg/dL (ref 0.0–1.2)
Total Protein: 7.5 g/dL (ref 6.5–8.1)

## 2024-08-13 LAB — LIPASE, BLOOD: Lipase: 31 U/L (ref 11–51)

## 2024-08-13 LAB — LACTIC ACID, PLASMA
Lactic Acid, Venous: 2.8 mmol/L (ref 0.5–1.9)
Lactic Acid, Venous: 1.8 mmol/L (ref 0.5–1.9)

## 2024-08-13 LAB — PROTIME-INR
INR: 1 (ref 0.8–1.2)
Prothrombin Time: 13.9 s (ref 11.4–15.2)

## 2024-08-13 LAB — CBC WITH DIFFERENTIAL/PLATELET
Abs Immature Granulocytes: 0.02 K/uL (ref 0.00–0.07)
Basophils Absolute: 0 K/uL (ref 0.0–0.1)
Basophils Relative: 0 %
Eosinophils Absolute: 0.2 K/uL (ref 0.0–0.5)
Eosinophils Relative: 2 %
HCT: 44.4 % (ref 36.0–46.0)
Hemoglobin: 14.5 g/dL (ref 12.0–15.0)
Immature Granulocytes: 0 %
Lymphocytes Relative: 25 %
Lymphs Abs: 2.4 K/uL (ref 0.7–4.0)
MCH: 27.7 pg (ref 26.0–34.0)
MCHC: 32.7 g/dL (ref 30.0–36.0)
MCV: 84.9 fL (ref 80.0–100.0)
Monocytes Absolute: 0.6 K/uL (ref 0.1–1.0)
Monocytes Relative: 6 %
Neutro Abs: 6.5 K/uL (ref 1.7–7.7)
Neutrophils Relative %: 67 %
Platelets: 276 K/uL (ref 150–400)
RBC: 5.23 MIL/uL — ABNORMAL HIGH (ref 3.87–5.11)
RDW: 12 % (ref 11.5–15.5)
WBC: 9.7 K/uL (ref 4.0–10.5)
nRBC: 0 % (ref 0.0–0.2)

## 2024-08-13 LAB — TROPONIN I (HIGH SENSITIVITY)
Troponin I (High Sensitivity): 3 ng/L (ref <18)
Troponin I (High Sensitivity): 4 ng/L (ref <18)

## 2024-08-13 MED ORDER — MORPHINE SULFATE (PF) 4 MG/ML IV SOLN
4.0000 mg | Freq: Once | INTRAVENOUS | Status: AC
  Administered 2024-08-13: 4 mg via INTRAVENOUS
  Filled 2024-08-13: qty 1

## 2024-08-13 MED ORDER — DOXYCYCLINE HYCLATE 100 MG PO TABS: 100.0000 mg | ORAL_TABLET | Freq: Two times a day (BID) | ORAL | 0 refills | Status: AC

## 2024-08-13 MED ORDER — SODIUM CHLORIDE 0.9 % IV SOLN
2.0000 g | Freq: Once | INTRAVENOUS | Status: AC
  Administered 2024-08-13: 2 g via INTRAVENOUS
  Filled 2024-08-13: qty 20

## 2024-08-13 MED ORDER — CEFPODOXIME PROXETIL 200 MG PO TABS: 400.0000 mg | ORAL_TABLET | Freq: Two times a day (BID) | ORAL | 0 refills | Status: AC

## 2024-08-13 MED ORDER — DOXYCYCLINE HYCLATE 100 MG PO TABS
100.0000 mg | ORAL_TABLET | Freq: Once | ORAL | Status: AC
  Administered 2024-08-13: 100 mg via ORAL
  Filled 2024-08-13: qty 1

## 2024-08-13 MED ORDER — ONDANSETRON HCL 4 MG/2ML IJ SOLN
4.0000 mg | Freq: Once | INTRAMUSCULAR | Status: AC
  Administered 2024-08-13: 4 mg via INTRAVENOUS
  Filled 2024-08-13: qty 2

## 2024-08-13 MED ORDER — IOHEXOL 300 MG/ML  SOLN
125.0000 mL | Freq: Once | INTRAMUSCULAR | Status: AC | PRN
  Administered 2024-08-13: 100 mL via INTRAVENOUS

## 2024-08-13 MED ORDER — SODIUM CHLORIDE 0.9 % IV BOLUS
1000.0000 mL | Freq: Once | INTRAVENOUS | Status: AC
  Administered 2024-08-13: 1000 mL via INTRAVENOUS

## 2024-08-13 NOTE — ED Triage Notes (Addendum)
 Patient arrives to ED via [facility] with complaints of RUQ abdominal pain for the last two days. Patient stated it went away for awhile but came back and got worse this morning. Patient has been taking Tylenol  at home for pain management with no relief, and has been nauseated with no vomiting. Patient states she has had normal bowel movements at home, last BM yesterday afternoon.

## 2024-08-13 NOTE — ED Notes (Signed)
 IV US  at the bedside.

## 2024-08-13 NOTE — ED Notes (Signed)
CT called and informed of IV access

## 2024-08-13 NOTE — ED Provider Notes (Signed)
 [facility] Provider Note    Event Date/Time   First MD Initiated Contact with Patient 08/13/24 [phone]     (approximate)   History   Abdominal Pain  Patient arrives to ED via [facility] with complaints of RUQ abdominal pain for the last two days. Patient stated it went away for awhile but came back and got worse this morning. Patient has been taking Tylenol  at home for pain management with no relief, and has been nauseated with no vomiting. Patient states she has had normal bowel movements at home, last BM yesterday afternoon.    HPI Tracy Woods is a 53 y.o. female PMH T2DM, BMI greater than 68, anxiety/depression, history of necrotizing fasciitis (abdominal wall, 09/2022), known ventral hernia presents for evaluation of abdominal pain -Patient states she has noticed some vague upper abdominal discomfort over the past couple days though since this morning has been having severe right upper quadrant/epigastric pain. -Pain is 9/10, sharp.  Appears to localize to right upper quadrant.  Some nausea but no vomiting.  Last bowel movement yesterday, unremarkable, no black or bloody stool, no diarrhea.  No fevers. -Denies any urinary symptoms   Per chart review, last admitted 10/04/2023-10/08/2023 for cellulitis of abdominal wall.     Physical Exam   Triage Vital Signs: BP (!) 141/75   Pulse 96   Temp 98.1 F (36.7 C) (Oral)   Resp (!) 31   Ht 5' 6 (1.676 m)   Wt (!) 181.1 kg   SpO2 97%   BMI 64.45 kg/m     Most recent vital signs: Vitals:   08/13/24 1256 08/13/24 1300  BP:  (!) 141/75  Pulse:  96  Resp:    Temp: 98.1 F (36.7 C)   SpO2:  97%     General: Awake, no distress.  Morbidly obese.  Appears very uncomfortable. CV:  Good peripheral perfusion. RRR, RP 2+ Resp:  Normal effort. CTAB Abd:  No distention. + Focal tenderness palpation in right upper quadrant with voluntary guarding.  No clear tenderness elsewhere throughout abdomen.   Ventral hernia present with no overlying skin changes, reducible.    ED Results / Procedures / Treatments   Labs (all labs ordered are listed, but only abnormal results are displayed) Labs Reviewed  COMPREHENSIVE METABOLIC PANEL WITH GFR - Abnormal; Notable for the following components:      Result Value   Glucose, Bld 227 (*)    Albumin 2.9 (*)    All other components within normal limits  LACTIC ACID, PLASMA - Abnormal; Notable for the following components:   Lactic Acid, Venous 2.8 (*)    All other components within normal limits  CBC WITH DIFFERENTIAL/PLATELET - Abnormal; Notable for the following components:   RBC 5.23 (*)    All other components within normal limits  URINALYSIS, COMPLETE (UACMP) WITH MICROSCOPIC - Abnormal; Notable for the following components:   Color, Urine YELLOW (*)    APPearance CLEAR (*)    Specific Gravity, Urine 1.040 (*)    Protein, ur 30 (*)    Nitrite POSITIVE (*)    Bacteria, UA MANY (*)    All other components within normal limits  LIPASE, BLOOD  LACTIC ACID, PLASMA  PROTIME-INR  TROPONIN I (HIGH SENSITIVITY)  TROPONIN I (HIGH SENSITIVITY)     EKG  Ecg = sinus rhythm, rate 90, no gross ST elevation or depression, no significant repolarization abnormality, normal axis, normal intervals.  No clear evidence of ischemia no arrhythmia on  my interpretation.  Similar to prior from 09/2022.   RADIOLOGY Radiology interpreted by myself and radiology report reviewed.  No acute pathology identified other than some edematous tissue in the lower abdominal wall.    PROCEDURES:  Critical Care performed: No  Procedures   MEDICATIONS ORDERED IN ED: Medications  morphine  (PF) 4 MG/ML injection 4 mg (4 mg Intravenous Given 08/13/24 0820)  ondansetron  (ZOFRAN ) injection 4 mg (4 mg Intravenous Given 08/13/24 0820)  sodium chloride  0.9 % bolus 1,000 mL (0 mLs Intravenous Stopped 08/13/24 1247)  iohexol  (OMNIPAQUE ) 300 MG/ML solution 125 mL (100 mLs  Intravenous Contrast Given 08/13/24 1046)  cefTRIAXone  (ROCEPHIN ) 2 g in sodium chloride  0.9 % 100 mL IVPB (0 g Intravenous Stopped 08/13/24 1408)  doxycycline  (VIBRA -TABS) tablet 100 mg (100 mg Oral Given 08/13/24 1252)     IMPRESSION / MDM / ASSESSMENT AND PLAN / ED COURSE  I reviewed the triage vital signs and the nursing notes.                              DDX/MDM/AP: Differential diagnosis includes, but is not limited to, cholecystitis/choledocholithiasis, pancreatitis, gastritis/dyspepsia, doubt recurrent necrotizing fasciitis based on exam, considered but doubt hernia related pathology including incarceration/strangulation.  Consider early SBO.  Consider pyelonephritis.  Considered but doubt ACS/anginal equivalent.  Plan: -Labs - Pain control - N.p.o. - EKG - CT abdomen pelvis - Reassess  Patient's presentation is most consistent with acute presentation with potential threat to life or bodily function.  The patient is on the cardiac monitor to evaluate for evidence of arrhythmia and/or significant heart rate changes.  ED course below.  Workup overall reassuring.  CT with edematous tissue of lower abdominal pannus, does have some mild erythema and warmth on my exam though no underlying abscess on CT, no free air, no concerning findings on exam.  Will treat empirically for early abdominal wall cellulitis given patient does have a history of this, can treated with ceftriaxone  and doxycycline  in the emergency department, plan for discharge on high-dose cefpodoxime and doxycycline .  With regard to her prior abdominal pain, has not recurred here in emergency department and does not localize to source of cellulitis.  Patient does note some recent urinary symptoms, urinalysis equivocal, consider possible pyelonephritis as etiology of pain--continue with cefpodoxime as above.  Plan for PMD follow-up.  ED return precautions in place.  Patient agrees with plan.  Clinical Course as of 08/13/24  1418  Fri Aug 13, 2024  9161 CBC with no leukocytosis, no anemia [MM]  0859 CMP reviewed, overall unremarkable  Trop wnl [MM]  0900 Lactate elevated at 2.8, no clear source of infection at this time [MM]  0902 Patient reevaluated, pain notably improved with some persistent  Pain was somewhat out of proportion on initial exam and does have an elevated lactate here --Will plan for CTA abdomen pelvis.  IV is currently only in hand, will require more proximal placement prior to CT. [MM]  1056 Rpt lactate normalized  Rpt trop wnl [MM]  1127 CTA AP: IMPRESSION: 1. No acute findings in the abdomen/pelvis. No evidence of mesenteric ischemia. 2. Moderate size umbilical hernia containing multiple loops of small bowel without evidence of obstruction. 3. Significant subcutaneous edema over the lower abdomen/pelvic anterior abdominal wall worse compared to the prior exams. Findings may represent noninfectious edema, although could be seen with cellulitis. No definite focal fluid collection to suggest abdominal wall abscess. No air in  the visualized soft tissues. 4. Aortic atherosclerosis.   [MM]  1246 Patient reevaluated.  Discussed overall reassuring workup.  Does have some erythema and warmth over her lower abdominal pannus.  No crepitus or necrotic tissue.  Patient states it is frequently somewhat red but in shared decision making given CT findings and history of recurrent cellulitis in this region she is amenable to empiric treatment for presumed cellulitis.  Will treat with ceftriaxone  and doxycycline  and plan for discharge with cefpodoxime.  Fortunately has not had any recurrence of her severe right-sided abdominal pain, unclear etiology at this time but no evidence of acute pathology.  Will provide urine sample now to ensure no underlying UTI.  Tentative plan for discharge home. [MM]  1323 UA contaminated though nitrate positive, concern for possible underlying infection  Already treating  with ceftriaxone   Given symptoms, will treat empirically for possible pyelonephritis and plan for 10-day course of cefpodoxime in addition to 7-day course of doxycycline  which has been used in addition for cellulitis. [MM]    Clinical Course User Index [MM] Clarine Ozell LABOR, MD     FINAL CLINICAL IMPRESSION(S) / ED DIAGNOSES   Final diagnoses:  Abdominal pain, unspecified abdominal location  Urinary tract infection without hematuria, site unspecified  Abdominal wall cellulitis     Rx / DC Orders   ED Discharge Orders          Ordered    cefpodoxime (VANTIN) 200 MG tablet  2 times daily        08/13/24 1416    doxycycline  (VIBRA -TABS) 100 MG tablet  2 times daily        08/13/24 1416             Note:  This document was prepared using Dragon voice recognition software and may include unintentional dictation errors.   Clarine Ozell LABOR, MD 08/13/24 [phone]

## 2024-08-13 NOTE — Discharge Instructions (Signed)
 Your evaluation in the emergency department was overall reassuring.  I am unsure as to the exact cause of your pain, but it is possible he may have a kidney infection as your urine did show some evidence of infection.  I have started you on a course of antibiotics to treat both this and the skin infection of your lower abdominal wall.  Please do follow-up closely with your primary care provider for reevaluation, and return to the emergency department with any new or worsening symptoms.

## 2024-08-13 NOTE — ED Notes (Signed)
 Patient transported to CT

## 2024-08-13 NOTE — ED Notes (Signed)
Patient ambulated to toilet with minimal assistance.

## 2024-08-31 ENCOUNTER — Telehealth: Payer: Self-pay

## 2024-08-31 NOTE — Telephone Encounter (Signed)
 Copied from CRM [id number]. Topic: Clinical - Medical Advice >> Aug 31, 2024  3:25 PM Myrick T wrote: Reason for CRM: [facility] [phone] from called to let provider know patients needs bariatric hospital bed, patient is also non compliant with her medications (diabetic, blood pressure and cholesterol). Ginger says patient will need an appt to discuss her meds and the side effects.

## 2024-08-31 NOTE — Telephone Encounter (Signed)
**Note De-identified  [place] Obfuscation** Please advise 

## 2024-09-01 NOTE — Telephone Encounter (Signed)
 Scheduled appt for 09/03/24 at 8 am. Pt gets winded easily has trouble with mobility and states that's one of her reasons she has not scheduled an appt. I have scheduled pt for a virtual appt and will not be operating vehicle during call.

## 2024-09-03 ENCOUNTER — Telehealth: Admitting: Physician Assistant

## 2024-09-03 DIAGNOSIS — M7989 Other specified soft tissue disorders: Secondary | ICD-10-CM

## 2024-09-03 DIAGNOSIS — G8929 Other chronic pain: Secondary | ICD-10-CM | POA: Diagnosis not present

## 2024-09-03 DIAGNOSIS — G479 Sleep disorder, unspecified: Secondary | ICD-10-CM

## 2024-09-03 NOTE — Progress Notes (Unsigned)
 MyChart Video Visit  Virtual Visit via Video Note   This format is felt to be most appropriate for this patient at this time. Physical exam was limited by quality of the video and audio technology used for the visit.   Provider location: Virtual Visit Location Provider: Office/Clinic Patient Location: Home  I discussed the limitations of evaluation and management by telemedicine and the availability of in person appointments. The patient expressed understanding and agreed to proceed.  Patient: Tracy Woods   DOB: [birth year]   53 y.o. Female  MRN: [id number] Visit Date: 09/03/2024  Today's healthcare provider: Jolynn Spencer, PA-C   No chief complaint on file.  Subjective     Discussed the use of AI scribe software for clinical note transcription with the patient, who gave verbal consent to proceed.  History of Present Illness Tracy Woods is a 53 year old female who presents with difficulty sleeping due to lack of a suitable bed and recent excruciating pain.  Two weeks ago, she experienced excruciating pain that caused difficulty breathing. During an emergency care visit, a urinary tract infection was confirmed. She also had a skin infection in the lower abdomen.  She currently faces significant sleep issues due to the lack of a proper bed. She sleeps in an old recliner, which is uncomfortable and unsupportive, leading to poor sleep quality. Her weight is around 399 pounds, and she suspects it may have increased. She requires a bariatric or hospital bed to accommodate her weight and provide adjustable support to help with poor circulation and leg swelling.  She is concerned about her mobility and transportation issues, which limit her ability to address these needs independently.     Medications: Outpatient Medications Prior to Visit  Medication Sig   acetaminophen  (TYLENOL ) 500 MG tablet Take 1 tablet (500 mg total) by mouth every 8 (eight) hours as needed.   Blood  Pressure Monitoring (BLOOD PRESSURE KIT) DEVI Measure BP at home daily   cyanocobalamin  (VITAMIN B12) 500 MCG tablet Take 1 tablet (500 mcg total) by mouth daily.   erythromycin  ophthalmic ointment Place 1 Application into the left eye at bedtime.   gabapentin  (NEURONTIN ) 300 MG capsule Take 1 capsule (300 mg total) by mouth at bedtime.   HYDROcodone -acetaminophen  (NORCO/VICODIN) 5-325 MG tablet Take 1 tablet by mouth every 6 (six) hours as needed for moderate pain (pain score 4-6).   hydrOXYzine  (VISTARIL ) 50 MG capsule Take 1 capsule (50 mg total) by mouth 3 (three) times daily as needed.   metFORMIN  (GLUCOPHAGE ) 500 MG tablet Take by mouth daily with breakfast.   mupirocin  ointment (BACTROBAN ) 2 % Apply 1 Application topically 2 (two) times daily.   rosuvastatin  (CRESTOR ) 5 MG tablet Take 1 tablet (5 mg total) by mouth daily.   Semaglutide ,0.25 or 0.5MG /DOS, (OZEMPIC , 0.25 OR 0.5 MG/DOSE,) 2 MG/3ML SOPN Initial, 0.25 mg subQ once weekly for 4 weeks, then increase to 0.5 mg once weekly for 4 weeks, then increase to maintenance dose of 1 mg once weekly   triamcinolone  cream (KENALOG ) 0.1 % Apply topically 2 (two) times daily.   valsartan -hydrochlorothiazide  (DIOVAN -HCT) 80-12.5 MG tablet Take 1 tablet by mouth daily.   No facility-administered medications prior to visit.    Review of Systems All negative  Except see HPI   {Insert previous labs (optional):23779} {See past labs  Heme  Chem  Endocrine  Serology  Results Review (optional):1}   Objective    There were no vitals taken for this visit.  {Insert last BP/Wt (optional):23777}{See  vitals history (optional):1}     Physical Exam  Constitutional:      General: She is not in acute distress.    Appearance: Normal appearance.  HENT:     Head: Normocephalic.  Pulmonary:     Effort: Pulmonary effort is normal. No respiratory distress.  Neurological:     Mental Status: She is alert and oriented to person, place, and time.  Mental status is at baseline.    Assessment & Plan Morbid obesity with impaired mobility and lower extremity edema Morbid obesity causing impaired mobility and lower extremity edema. Sleeping in a recliner worsens her condition. A hospital or bariatric bed is needed for comfort, mobility, circulation, and breathing. - Check insurance coverage for a hospital or bariatric bed, typically requiring weight over 500 pounds. - Refer for a hospital or bariatric bed if covered by insurance. - Provide written instructions on checking insurance coverage. - Ensure bed can adjust to raise head and legs.  Chronic pain Chronic pain worsened by sleeping in an uncomfortable recliner, affecting sleep and comfort. - Facilitate acquisition of a suitable bed to improve sleep and comfort.  Sleeping difficulties Needs a proper bed supporting her bed Ordered a hospital bed Will follow-up  No follow-ups on file.     I discussed the assessment and treatment plan with the patient. The patient was provided an opportunity to ask questions and all were answered. The patient agreed with the plan and demonstrated an understanding of the instructions.   The patient was advised to call back or seek an in-person evaluation if the symptoms worsen or if the condition fails to improve as anticipated.  I, Michaelle Bottomley, PA-C have reviewed all documentation for this visit. The documentation on 09/03/2024  for the exam, diagnosis, procedures, and orders are all accurate and complete.  Jolynn Spencer, [facility], MMS [facility] [phone] (phone) [phone] (fax)  [facility]

## 2024-09-06 DIAGNOSIS — G479 Sleep disorder, unspecified: Secondary | ICD-10-CM | POA: Insufficient documentation

## 2024-09-08 ENCOUNTER — Telehealth: Payer: Self-pay

## 2024-09-08 NOTE — Telephone Encounter (Signed)
 Hello Janna,  This is from [place] [phone] qualifies for bari bed. I updated your cart to the correct bed. A new signature is needed.   They are just needing you to sign on screen to proceed.

## 2024-09-15 NOTE — Telephone Encounter (Signed)
 Bed has been delivered to pt.

## 2024-10-16 ENCOUNTER — Telehealth

## 2024-10-16 DIAGNOSIS — R3989 Other symptoms and signs involving the genitourinary system: Secondary | ICD-10-CM

## 2024-10-17 ENCOUNTER — Encounter: Payer: Self-pay | Admitting: Physician Assistant

## 2024-10-17 MED ORDER — NITROFURANTOIN MONOHYD MACRO 100 MG PO CAPS: 100.0000 mg | ORAL_CAPSULE | Freq: Two times a day (BID) | ORAL | 0 refills | Status: AC

## 2024-10-17 NOTE — Progress Notes (Signed)
 Good morning Tracy Woods,  We are sorry that you are not feeling well.  Here is how we plan to help!  Based on what you shared with me it looks like you most likely have a simple urinary tract infection.  A UTI (Urinary Tract Infection) is a bacterial infection of the bladder.  Most cases of urinary tract infections are simple to treat but a key part of your care is to encourage you to drink plenty of fluids and watch your symptoms carefully.  I have prescribed MacroBid  100 mg twice a day for 5 days.  Your symptoms should gradually improve. Call us  if the burning in your urine worsens, you develop worsening fever, back pain or pelvic pain or if your symptoms do not resolve after completing the antibiotic.  Urinary tract infections can be prevented by drinking plenty of water to keep your body hydrated.  Also be sure when you wipe, wipe from front to back and don't hold it in!  If possible, empty your bladder every 4 hours.  Your e-visit answers were reviewed by a board certified advanced clinical practitioner to complete your personal care plan.  Depending on the condition, your plan could have included both over the counter or prescription medications.  If there is a problem please reply  once you have received a response from your provider.  Your safety is important to us .  If you have drug allergies check your prescription carefully.    You can use MyChart to ask questions about today's visit, request a non-urgent call back, or ask for a work or school excuse for 24 hours related to this e-Visit. If it has been greater than 24 hours you will need to follow up with your provider, or enter a new e-Visit to address those concerns.   You will get an e-mail in the next two days asking about your experience.  I hope that your e-visit has been valuable and will speed your recovery. Thank you for using e-visits.  I have spent 10 minutes in review of e-visit questionnaire, review and updating  patient chart, medical decision making and response to patient.   Tracy RAMAN Mayers, PA-C

## 2024-10-19 ENCOUNTER — Other Ambulatory Visit: Payer: Self-pay

## 2024-10-19 DIAGNOSIS — Z1231 Encounter for screening mammogram for malignant neoplasm of breast: Secondary | ICD-10-CM

## 2024-10-22 ENCOUNTER — Ambulatory Visit: Payer: Self-pay

## 2024-10-22 NOTE — Telephone Encounter (Signed)
 FYI Only or Action Required?: FYI only for provider: appointment scheduled on VV on Saturday - pt unable to go to UC d/t transportation issues.  Patient was last seen in primary care on 09/03/2024 by Ostwalt, Janna, PA-C.  Called Nurse Triage reporting Urinary discomfort.  Symptoms began a week ago.  Interventions attempted: Other: Had VV and was given antibiotics - s/s remain the same.  Symptoms are: unchanged.  Triage Disposition: See HCP Within 4 Hours (Or PCP Triage)  Patient/caregiver understands and will follow disposition?: Pt will take VV tomorrow morning.                Copied from CRM #[id number]. Topic: Clinical - Medical Advice >> Oct 22, 2024  4:49 PM Travis F wrote: Reason for CRM: Patient is calling in because she was prescribed Macrobid  and it is not working. Patient is on her last pill and her UTI is not clearing up. Patient wants to know if she should get started on another antibiotic or what she should do. Please advise. Reason for Disposition  Side (flank) or lower back pain present  Answer Assessment - Initial Assessment Questions 1. SYMPTOM: What's the main symptom you're concerned about? (e.g., frequency, incontinence)     Back pain - uncomfortable with urination, cramps - lower left abdomen 2. ONSET: When did the  discomfort  start?     Before 12/5  3. PAIN: Is there any pain? If Yes, ask: How bad is it? (Scale: 1-10; mild, moderate, severe)     Mild/ moderate more discomfort 4. CAUSE: What do you think is causing the symptoms?     UTI 5. OTHER SYMPTOMS: Do you have any other symptoms? (e.g., blood in urine, fever, flank pain, pain with urination)     Back pain  Protocols used: Urinary Symptoms-A-AH

## 2024-10-23 ENCOUNTER — Telehealth

## 2024-10-23 DIAGNOSIS — R399 Unspecified symptoms and signs involving the genitourinary system: Secondary | ICD-10-CM | POA: Diagnosis not present

## 2024-10-23 MED ORDER — SULFAMETHOXAZOLE-TRIMETHOPRIM 800-160 MG PO TABS: 1.0000 | ORAL_TABLET | Freq: Two times a day (BID) | ORAL | 0 refills | Status: AC

## 2024-10-23 NOTE — Patient Instructions (Signed)
 Tracy Woods, thank you for joining [name], PA-C for today's virtual visit.  While this provider is not your primary care provider (PCP), if your PCP is located in our provider database this encounter information will be shared with them immediately following your visit.   A [facility] account gives you access to today's visit and all your visits, tests, and labs performed at [facility]  click here if you don't have a [facility] account or go to [email or web address]  Consent: (Patient) Tracy Woods provided verbal consent for this virtual visit at the beginning of the encounter.  Current Medications:  Current Outpatient Medications:    sulfamethoxazole -trimethoprim  (BACTRIM  DS) 800-160 MG tablet, Take 1 tablet by mouth 2 (two) times daily for 7 days., Disp: 14 tablet, Rfl: 0   acetaminophen  (TYLENOL ) 500 MG tablet, Take 1 tablet (500 mg total) by mouth every 8 (eight) hours as needed., Disp: 30 tablet, Rfl: 0   Blood Pressure Monitoring (BLOOD PRESSURE KIT) DEVI, Measure BP at home daily, Disp: 1 each, Rfl: 0   cyanocobalamin  (VITAMIN B12) 500 MCG tablet, Take 1 tablet (500 mcg total) by mouth daily., Disp: 30 tablet, Rfl: 12   erythromycin  ophthalmic ointment, Place 1 Application into the left eye at bedtime., Disp: 3.5 g, Rfl: 0   gabapentin  (NEURONTIN ) 300 MG capsule, Take 1 capsule (300 mg total) by mouth at bedtime., Disp: 90 capsule, Rfl: 3   HYDROcodone -acetaminophen  (NORCO/VICODIN) 5-325 MG tablet, Take 1 tablet by mouth every 6 (six) hours as needed for moderate pain (pain score 4-6)., Disp: 20 tablet, Rfl: 0   hydrOXYzine  (VISTARIL ) 50 MG capsule, Take 1 capsule (50 mg total) by mouth 3 (three) times daily as needed., Disp: 270 capsule, Rfl: 0   metFORMIN  (GLUCOPHAGE ) 500 MG tablet, Take by mouth daily with breakfast., Disp: , Rfl:    mupirocin  ointment (BACTROBAN ) 2 %, Apply 1 Application topically 2 (two) times daily., Disp: 22 g,  Rfl: 1   rosuvastatin  (CRESTOR ) 5 MG tablet, Take 1 tablet (5 mg total) by mouth daily., Disp: 90 tablet, Rfl: 3   Semaglutide ,0.25 or 0.5MG /DOS, (OZEMPIC , 0.25 OR 0.5 MG/DOSE,) 2 MG/3ML SOPN, Initial, 0.25 mg subQ once weekly for 4 weeks, then increase to 0.5 mg once weekly for 4 weeks, then increase to maintenance dose of 1 mg once weekly, Disp: 3 mL, Rfl: 1   triamcinolone  cream (KENALOG ) 0.1 %, Apply topically 2 (two) times daily., Disp: 30 g, Rfl: 0   valsartan -hydrochlorothiazide  (DIOVAN -HCT) 80-12.5 MG tablet, Take 1 tablet by mouth daily., Disp: 90 tablet, Rfl: 3   Medications ordered in this encounter:  Meds ordered this encounter  Medications   sulfamethoxazole -trimethoprim  (BACTRIM  DS) 800-160 MG tablet    Sig: Take 1 tablet by mouth 2 (two) times daily for 7 days.    Dispense:  14 tablet    Refill:  0     *If you need refills on other medications prior to your next appointment, please contact your pharmacy*  Follow-Up: Call back or seek an in-person evaluation if the symptoms worsen or if the condition fails to improve as anticipated.  [facility] [phone]  Other Instructions Urinary Tract Infection, Female A urinary tract infection (UTI) is an infection in your urinary tract. The urinary tract is made up of organs that make, store, and get rid of pee (urine) in your body. These organs include: The kidneys. The ureters. The bladder. The urethra. What are the causes? Most UTIs are caused by  germs called bacteria. They may be in or near your genitals. These germs grow and cause swelling in your urinary tract. What increases the risk? You're more likely to get a UTI if: You're a female. The urethra is shorter in females than in males. You have a soft tube called a catheter that drains your pee. You can't control when you pee or poop. You have trouble peeing because of: A kidney stone. A urinary blockage. A nerve condition that affects your  bladder. Not getting enough to drink. You're sexually active. You use a birth control inside your vagina, like spermicide. You're pregnant. You have low levels of the hormone estrogen in your body. You're an older adult. You're also more likely to get a UTI if you have other health problems. These may include: Diabetes. A weak immune system. Your immune system is your body's defense system. Sickle cell disease. Injury of the spine. What are the signs or symptoms? Symptoms may include: Needing to pee right away. Peeing small amounts often. Pain or burning when you pee. Blood in your pee. Pee that smells bad or odd. Pain in your belly or lower back. You may also: Feel confused. This may be the first symptom in older adults. Vomit. Not feel hungry. Feel tired or easily annoyed. Have a fever or chills. How is this diagnosed? A UTI is diagnosed based on your medical history and an exam. You may also have other tests. These may include: Pee tests. Blood tests. Tests for sexually transmitted infections (STIs). If you've had more than one UTI, you may need to have imaging studies done to find out why you keep getting them. How is this treated? A UTI can be treated by: Taking antibiotics or other medicines. Drinking enough fluid to keep your pee pale yellow. In rare cases, a UTI can cause a very bad condition called sepsis. Sepsis may be treated in the hospital. Follow these instructions at home: Medicines Take your medicines only as told by your health care provider. If you were given antibiotics, take them as told by your provider. Do not stop taking them even if you start to feel better. General instructions Make sure you: Pee often and fully. Do not hold your pee for a long time. Wipe from front to back after you pee or poop. Use each tissue only once when you wipe. Pee after you have sex. Do not douche or use sprays or powders in your genital area. Contact a health care  provider if: Your symptoms don't get better after 1-2 days of taking antibiotics. Your symptoms go away and then come back. You have a fever or chills. You vomit or feel like you may vomit. Get help right away if: You have very bad pain in your back or lower belly. You faint. This information is not intended to replace advice given to you by your health care provider. Make sure you discuss any questions you have with your health care provider. Document Revised: 10/08/2023 Document Reviewed: 01/31/2023 Elsevier Patient Education  [employer].   If you have been instructed to have an in-person evaluation today at a local Urgent Care facility, please use the link below. It will take you to a list of all of our available [facility], including address, phone number and hours of operation. Please do not delay care.  [facility]  If you or a family member do not have a primary care provider, use the link below to schedule  a visit and establish care. When you choose a [facility] primary care physician or advanced practice provider, you gain a long-term partner in health. Find a Primary Care Provider  Learn more about [facility] in-office and virtual care options: [facility] - Get Care Now

## 2024-10-23 NOTE — Progress Notes (Signed)
 Virtual Visit Consent   Tracy Woods, you are scheduled for a virtual visit with a [facility] provider today. Just as with appointments in the office, your consent must be obtained to participate. Your consent will be active for this visit and any virtual visit you may have with one of our providers in the next 365 days. If you have a MyChart account, a copy of this consent can be sent to you electronically.  As this is a virtual visit, video technology does not allow for your provider to perform a traditional examination. This may limit your provider's ability to fully assess your condition. If your provider identifies any concerns that need to be evaluated in person or the need to arrange testing (such as labs, EKG, etc.), we will make arrangements to do so. Although advances in technology are sophisticated, we cannot ensure that it will always work on either your end or our end. If the connection with a video visit is poor, the visit may have to be switched to a telephone visit. With either a video or telephone visit, we are not always able to ensure that we have a secure connection.  By engaging in this virtual visit, you consent to the provision of healthcare and authorize for your insurance to be billed (if applicable) for the services provided during this visit. Depending on your insurance coverage, you may receive a charge related to this service.  I need to obtain your verbal consent now. Are you willing to proceed with your visit today? Tracy Woods has provided verbal consent on 10/23/2024 for a virtual visit (video or telephone). Tracy Woods, [place]  Date: 10/23/2024 10:38 AM   Virtual Visit via Video Note   I, Tracy Woods, connected with  Tracy Woods  ([id number], Feb 17, 1971) on 10/23/2024 at 10:30 AM EST by a video-enabled telemedicine application and verified that I am speaking with the correct person using two identifiers.  Location: Patient: Virtual Visit Location  Patient: Home Provider: Virtual Visit Location Provider: Home Office   I discussed the limitations of evaluation and management by telemedicine and the availability of in person appointments. The patient expressed understanding and agreed to proceed.    History of Present Illness: Tracy Woods is a 53 y.o. who identifies as a female who was assigned female at birth, and is being seen today for c/o frequent UTIs for the past two years. Pt states she has a UTI recently.  Pt states she did an e visit and was prescribed Macrobid  but was not helpful.  Pt states she completed the antibiotics and is still experiencing symptoms. Pt requesting a different antibiotic.   HPI: HPI  Problems:  Patient Active Problem List   Diagnosis Date Noted   Morbid obesity (HCC) 09/06/2024   Sleep difficulties 09/06/2024   History of abdominal wall necrotizing fasciitis 09/2022 10/05/2023   Ventral hernia without obstruction or gangrene 10/05/2023   Cellulitis of abdominal wall 10/05/2023   Adenomatous polyp of colon 09/02/2023   LLQ abdominal pain 09/02/2023   Ingrown toenail of left foot 07/29/2023   Upslanting toenails 07/29/2023   Urinary frequency 07/29/2023   Elevated cholesterol 06/26/2023   Bilateral lower abdominal discomfort 06/26/2023   Urinary problem in female 06/26/2023   Candidiasis 06/26/2023   Other chronic pain 05/30/2023   Abdominal pain 05/29/2023   Morbid obesity with BMI of 50.0-59.9, adult (HCC) 05/10/2023   Type 2 diabetes mellitus with other specified complication (HCC) 05/10/2023   Dyspnea 05/10/2023   Anxiety and depression  05/10/2023   Neuropathy 05/10/2023   Clinical diagnosis of COVID-19 11/25/2022   Aftercare following surgery of the skin or subcutaneous tissue 11/18/2022   Anxiety 11/18/2022   Candidal intertrigo 11/18/2022   Colon cancer screening 11/18/2022   History of urinary tract infection 11/18/2022   Pseudomonas (aeruginosa) (mallei) (pseudomallei) as the  cause of diseases classified elsewhere 11/18/2022   Necrotizing fasciitis (HCC) 10/24/2022   Severe sepsis with lactic acidosis (HCC) 09/15/2022    Allergies: Allergies[1] Medications: Current Medications[2]  Observations/Objective: Patient is well-developed, well-nourished in no acute distress.  Resting comfortably at home.  Head is normocephalic, atraumatic.  No labored breathing.  Speech is clear and coherent with logical content.  Patient is alert and oriented at baseline.    Assessment and Plan: 1. UTI symptoms (Primary) - sulfamethoxazole -trimethoprim  (BACTRIM  DS) 800-160 MG tablet; Take 1 tablet by mouth 2 (two) times daily for 7 days.  Dispense: 14 tablet; Refill: 0  -Pt advised if symptoms persist to be seen in person by PCP or urgent care for further evaluation  Follow Up Instructions: I discussed the assessment and treatment plan with the patient. The patient was provided an opportunity to ask questions and all were answered. The patient agreed with the plan and demonstrated an understanding of the instructions.  A copy of instructions were sent to the patient via MyChart unless otherwise noted below.    The patient was advised to call back or seek an in-person evaluation if the symptoms worsen or if the condition fails to improve as anticipated.    Tracy Mater, PA-C    [1]  Allergies Allergen Reactions   Amoxicillin Rash    Tolerated Zosyn  09/16/22 with no apparent issue  [2]  Current Outpatient Medications:    sulfamethoxazole -trimethoprim  (BACTRIM  DS) 800-160 MG tablet, Take 1 tablet by mouth 2 (two) times daily for 7 days., Disp: 14 tablet, Rfl: 0   acetaminophen  (TYLENOL ) 500 MG tablet, Take 1 tablet (500 mg total) by mouth every 8 (eight) hours as needed., Disp: 30 tablet, Rfl: 0   Blood Pressure Monitoring (BLOOD PRESSURE KIT) DEVI, Measure BP at home daily, Disp: 1 each, Rfl: 0   cyanocobalamin  (VITAMIN B12) 500 MCG tablet, Take 1 tablet (500 mcg total) by  mouth daily., Disp: 30 tablet, Rfl: 12   erythromycin  ophthalmic ointment, Place 1 Application into the left eye at bedtime., Disp: 3.5 g, Rfl: 0   gabapentin  (NEURONTIN ) 300 MG capsule, Take 1 capsule (300 mg total) by mouth at bedtime., Disp: 90 capsule, Rfl: 3   HYDROcodone -acetaminophen  (NORCO/VICODIN) 5-325 MG tablet, Take 1 tablet by mouth every 6 (six) hours as needed for moderate pain (pain score 4-6)., Disp: 20 tablet, Rfl: 0   hydrOXYzine  (VISTARIL ) 50 MG capsule, Take 1 capsule (50 mg total) by mouth 3 (three) times daily as needed., Disp: 270 capsule, Rfl: 0   metFORMIN  (GLUCOPHAGE ) 500 MG tablet, Take by mouth daily with breakfast., Disp: , Rfl:    mupirocin  ointment (BACTROBAN ) 2 %, Apply 1 Application topically 2 (two) times daily., Disp: 22 g, Rfl: 1   rosuvastatin  (CRESTOR ) 5 MG tablet, Take 1 tablet (5 mg total) by mouth daily., Disp: 90 tablet, Rfl: 3   Semaglutide ,0.25 or 0.5MG /DOS, (OZEMPIC , 0.25 OR 0.5 MG/DOSE,) 2 MG/3ML SOPN, Initial, 0.25 mg subQ once weekly for 4 weeks, then increase to 0.5 mg once weekly for 4 weeks, then increase to maintenance dose of 1 mg once weekly, Disp: 3 mL, Rfl: 1   triamcinolone  cream (KENALOG ) 0.1 %,  Apply topically 2 (two) times daily., Disp: 30 g, Rfl: 0   valsartan -hydrochlorothiazide  (DIOVAN -HCT) 80-12.5 MG tablet, Take 1 tablet by mouth daily., Disp: 90 tablet, Rfl: 3

## 2024-11-03 ENCOUNTER — Ambulatory Visit: Payer: Self-pay

## 2024-11-03 DIAGNOSIS — Z5982 Transportation insecurity: Secondary | ICD-10-CM

## 2024-11-03 NOTE — Telephone Encounter (Signed)
 FYI Only or Action Required?: Action required by provider: Pt requesting urine culture due to repeated UTIs, she does not wish to have additional antibiotics at this time and declines appt. Pt reports if a culture cannot be done she is open to antibiotics but has mobility limitations at this time and is unable to come in for OV.  Patient was last seen in primary care on 10/23/2024 by Kingston Robes, PA-C.  Called Nurse Triage reporting Dysuria.  Symptoms began Ongoing, improved but not resolved.  Interventions attempted: Prescription medications: Antibiotics as previously prescribed and Rest, hydration, or home remedies.  Symptoms are: unchanged.  Triage Disposition: See HCP Within 4 Hours (Or PCP Triage)  Patient/caregiver understands and will follow disposition?: No, wishes to speak with PCP         Summary: UTI, unable to come in to the office   Reason for Triage: Pt called to report that she is not mobile, however she is still suffering from UTI symptoms. She has completed two rounds of antibiotics however no relief. She wants to have a urinary lab kit sent to her via mail.  Best contact: [phone]         Reason for Disposition  Side (flank) or lower back pain present  Answer Assessment - Initial Assessment Questions 1. SYMPTOM: What's the main symptom you're concerned about? (e.g., frequency, incontinence)     Dysuria 2. ONSET: When did the  dysuria  start?     Ongoing, pt recently treated with antibiotics 3. PAIN: Is there any pain? If Yes, ask: How bad is it? (Scale: 1-10; mild, moderate, severe)     Discomfort 4. CAUSE: What do you think is causing the symptoms?     Pt believes ongoing UTI 5. OTHER SYMPTOMS: Do you have any other symptoms? (e.g., blood in urine, fever, flank pain, pain with urination)     Intermittent LBP, feeling have to push to fully empty bladder  Protocols used: Urinary Symptoms-A-AH

## 2024-11-03 NOTE — Telephone Encounter (Signed)
 Yes, she needs u/a and urine culture. Had televisit on the 13th.

## 2024-11-05 NOTE — Telephone Encounter (Signed)
 Detailed VM left per DPR.   If call is returned okay to advise a urine specimen is needed in order to properly treat due to continued symptoms, We are not able to mail these to the patient as initially requested but if she is able to have someone bring in a specimen we can further send to the lab for further testing for treatment. If specimen can not be brought to the office immediately it should be refrigerated within a hour of collecting.

## 2024-11-08 NOTE — Telephone Encounter (Addendum)
 Patient is aware and states she does not have anyone or anyway to come to get a Urine cup/have someone in a specimen.She also states to get an Gisele to bring her son who is autistic and not able to drive to get a sterile cup and to bring it back is going to cost her money. She also said the last two(2) antibiotics that were prescribed didn't helped but the one she received at the hospital back in October did.

## 2024-11-17 NOTE — Addendum Note (Signed)
 Addended by: GASPER NANCYANN BRAVO on: 11/17/2024 05:05 PM   Modules accepted: Orders

## 2024-11-17 NOTE — Telephone Encounter (Signed)
 Tracy Woods

## 2024-11-18 ENCOUNTER — Telehealth: Payer: Self-pay

## 2024-11-18 NOTE — Progress Notes (Signed)
 Complex Care Management Note  Care Guide Note 11/18/2024 Name: Tracy Woods MRN: [id number] DOB: [birth year]  Tracy Woods is a 54 y.o. year old female who sees [place], Janna, PA-C for primary care. I reached out to [name] by phone today to offer complex care management services.  [name] was given information about Complex Care Management services today including:   The Complex Care Management services include support from the care team which includes your Nurse Care Manager, Clinical Social Worker, or Pharmacist.  The Complex Care Management team is here to help remove barriers to the health concerns and goals most important to you. Complex Care Management services are voluntary, and the patient may decline or stop services at any time by request to their care team member.   Complex Care Management Consent Status: Patient agreed to services and verbal consent obtained.   Follow up plan:  Telephone appointment with complex care management team member scheduled for:  12/02/24 @ 9 AM  Encounter Outcome:  Patient Scheduled  Leotis Rase [facility], [facility] Guide  Direct Dial: [phone]  Fax [phone]

## 2024-11-19 ENCOUNTER — Ambulatory Visit: Payer: Self-pay | Admitting: Physician Assistant

## 2024-11-19 ENCOUNTER — Telehealth: Payer: Self-pay

## 2024-11-19 NOTE — Telephone Encounter (Signed)
 Pt advised of virtual urgent care via [email or web address] if she prefers to be treated virtually but can not guarantee treatment as most times you do need to provide a specimen she verbalized understanding

## 2024-11-19 NOTE — Progress Notes (Signed)
 Complex Care Management Note Care Guide Note  11/19/2024 Name: Tracy Woods MRN: [id number] DOB: Jul 07, 1971  Tracy Woods is a 54 y.o. year old female who is a primary care patient of [place], Janna, PA-C . The community resource team was consulted for assistance with Transportation Needs  and in-home assistance.  SDOH screenings and interventions completed:  Yes  Social Drivers of Health From This Encounter   Food Insecurity: Patient Declined (11/19/2024)   Epic    Worried About Programme Researcher, Broadcasting/film/video in the Last Year: Patient declined    Barista in the Last Year: Patient declined  Housing: Unknown (11/19/2024)   Epic    Unable to Pay for Housing in the Last Year: Patient declined    Number of Times Moved in the Last Year: 0    Homeless in the Last Year: Patient declined  Financial Resource Strain: Patient Declined (11/19/2024)   Overall Financial Resource Strain (CARDIA)    Difficulty of Paying Living Expenses: Patient declined  Transportation Needs: Unmet Transportation Needs (11/19/2024)   Epic    Lack of Transportation (Medical): Yes    Lack of Transportation (Non-Medical): No  Utilities: Patient Declined (11/19/2024)   Epic    Threatened with loss of utilities: Patient declined    SDOH Interventions Today    Flowsheet Row Most Recent Value  SDOH Interventions   Food Insecurity Interventions Patient Declined  Housing Interventions Patient Declined  Transportation Interventions Other (Comment), Payor Benefit  [Emailed information about [employer] coverage for non-emergency ambulance transport and Care Management service.]  Utilities Interventions Patient Declined  Financial Strain Interventions Patient Declined     Care guide performed the following interventions: Patient provided with information about care guide support team and interviewed to confirm resource needs.  Follow Up Plan:  Sent in-basket message to Leotis Rase for patient to be scheduled with a  Child Psychotherapist. Patient stated she needs a Personal Care Assistant because she needs assistance with daily activities.  Encounter Outcome:  Patient Visit Completed  Katrell Milhorn [facility]  [facility] Guide Direct Dial: [phone]  Fax: [phone] Website: [email or web address]

## 2024-11-19 NOTE — Telephone Encounter (Signed)
 FYI Only or Action Required?: Action required by provider: request for appointment, medication refill request, clinical question for provider, and video visit.  Patient was last seen in primary care on 10/23/2024 by Kingston Robes, PA-C.  Called Nurse Triage reporting Urinary Tract Infection.  Symptoms began about a month ago.  Interventions attempted: Nothing.  Symptoms are: unchanged.  Triage Disposition: See Physician Within 24 Hours  Patient/caregiver understands and will follow disposition?: No, wishes to speak with PCP  Copied from CRM #[id number]. Topic: Clinical - Red Word Triage >> Nov 19, 2024  1:46 PM Donna BRAVO wrote: Red Word that prompted transfer to Nurse Triage:  UTI for over a month -2 rounds of different antibiotics -burning when urinating at the end -cramping in back and stomach getting worse -frequency Reason for Disposition  More than 2 UTI's in last year  Answer Assessment - Initial Assessment Questions Patient requesting another antibiotic and or video visit. Advised need to be seen in person for UA/culture. Patient declines in person visit.  Advised UC today and ED/911 if symptoms worsens: unable void >4hr, fever, chills, n/v, back pain/flank pain.  1. SEVERITY: How bad is the pain?  (e.g., Scale 1-10; mild, moderate, or severe)     2/10 2. FREQUENCY: How many times have you had painful urination today?      Each time 3. PATTERN: Is pain present every time you urinate or just sometimes?      Each time 4. ONSET: When did the painful urination start?      Month ago; took 2 different abt and not effective 5. FEVER: Do you have a fever? If Yes, ask: What is your temperature, how was it measured, and when did it start?     Denies fever chills n/v 6. PAST UTI: Have you had a urine infection before? If Yes, ask: When was the last time? and What happened that time?     December 7. CAUSE: What do you think is causing the painful urination?   (e.g., UTI, scratch, Herpes sore)     uti 8. OTHER SYMPTOMS: Do you have any other symptoms? (e.g., blood in urine, flank pain, genital sores, urgency, vaginal discharge)     denies  Protocols used: Urination Pain - Female-A-AH

## 2024-11-19 NOTE — Telephone Encounter (Signed)
 Called [facility], spoke Reena, informed patient cannot come in for in person visit and requesting video visit or antibiotic. Reports have been only doing video visits.  Reena reports need in person visit.

## 2024-11-20 ENCOUNTER — Telehealth

## 2024-11-20 ENCOUNTER — Telehealth: Admitting: Nurse Practitioner

## 2024-11-20 DIAGNOSIS — R309 Painful micturition, unspecified: Secondary | ICD-10-CM

## 2024-11-20 NOTE — Patient Instructions (Signed)
 " [name], thank you for joining Haze LELON Servant, NP for today's virtual visit.  While this provider is not your primary care provider (PCP), if your PCP is located in our provider database this encounter information will be shared with them immediately following your visit.   A [facility] MyChart account gives you access to today's visit and all your visits, tests, and labs performed at [facility]  click here if you don't have a [facility] MyChart account or go to [email or web address]  Consent: (Patient) Tracy Woods provided verbal consent for this virtual visit at the beginning of the encounter.  Current Medications:  Current Outpatient Medications:    acetaminophen  (TYLENOL ) 500 MG tablet, Take 1 tablet (500 mg total) by mouth every 8 (eight) hours as needed., Disp: 30 tablet, Rfl: 0   Blood Pressure Monitoring (BLOOD PRESSURE KIT) DEVI, Measure BP at home daily, Disp: 1 each, Rfl: 0   cyanocobalamin  (VITAMIN B12) 500 MCG tablet, Take 1 tablet (500 mcg total) by mouth daily., Disp: 30 tablet, Rfl: 12   erythromycin  ophthalmic ointment, Place 1 Application into the left eye at bedtime., Disp: 3.5 g, Rfl: 0   gabapentin  (NEURONTIN ) 300 MG capsule, Take 1 capsule (300 mg total) by mouth at bedtime., Disp: 90 capsule, Rfl: 3   HYDROcodone -acetaminophen  (NORCO/VICODIN) 5-325 MG tablet, Take 1 tablet by mouth every 6 (six) hours as needed for moderate pain (pain score 4-6)., Disp: 20 tablet, Rfl: 0   hydrOXYzine  (VISTARIL ) 50 MG capsule, Take 1 capsule (50 mg total) by mouth 3 (three) times daily as needed., Disp: 270 capsule, Rfl: 0   metFORMIN  (GLUCOPHAGE ) 500 MG tablet, Take by mouth daily with breakfast., Disp: , Rfl:    mupirocin  ointment (BACTROBAN ) 2 %, Apply 1 Application topically 2 (two) times daily., Disp: 22 g, Rfl: 1   rosuvastatin  (CRESTOR ) 5 MG tablet, Take 1 tablet (5 mg total) by mouth daily., Disp: 90 tablet, Rfl: 3   Semaglutide ,0.25 or  0.5MG /DOS, (OZEMPIC , 0.25 OR 0.5 MG/DOSE,) 2 MG/3ML SOPN, Initial, 0.25 mg subQ once weekly for 4 weeks, then increase to 0.5 mg once weekly for 4 weeks, then increase to maintenance dose of 1 mg once weekly, Disp: 3 mL, Rfl: 1   triamcinolone  cream (KENALOG ) 0.1 %, Apply topically 2 (two) times daily., Disp: 30 g, Rfl: 0   valsartan -hydrochlorothiazide  (DIOVAN -HCT) 80-12.5 MG tablet, Take 1 tablet by mouth daily., Disp: 90 tablet, Rfl: 3   Medications ordered in this encounter:  No orders of the defined types were placed in this encounter.    *If you need refills on other medications prior to your next appointment, please contact your pharmacy*  Follow-Up: Call back or seek an in-person evaluation if the symptoms worsen or if the condition fails to improve as anticipated.  [facility] [phone]  Other Instructions Needs to be seen in person for urinalysis complete/urine culture   If you have been instructed to have an in-person evaluation today at a local Urgent Care facility, please use the link below. It will take you to a list of all of our available [facility], including address, phone number and hours of operation. Please do not delay care.  [facility]  If you or a family member do not have a primary care provider, use the link below to schedule a visit and establish care. When you choose a [facility] primary care physician or advanced practice provider, you gain a long-term partner in  health. Find a Primary Care Provider  Learn more about [facility]'s in-office and virtual care options: [facility] - Get Care Now  "

## 2024-11-20 NOTE — Progress Notes (Signed)
 " Virtual Visit Consent   Tracy Woods, you are scheduled for a virtual visit with a [facility] provider today. Just as with appointments in the office, your consent must be obtained to participate. Your consent will be active for this visit and any virtual visit you may have with one of our providers in the next 365 days. If you have a MyChart account, a copy of this consent can be sent to you electronically.  As this is a virtual visit, video technology does not allow for your provider to perform a traditional examination. This may limit your provider's ability to fully assess your condition. If your provider identifies any concerns that need to be evaluated in person or the need to arrange testing (such as labs, EKG, etc.), we will make arrangements to do so. Although advances in technology are sophisticated, we cannot ensure that it will always work on either your end or our end. If the connection with a video visit is poor, the visit may have to be switched to a telephone visit. With either a video or telephone visit, we are not always able to ensure that we have a secure connection.  By engaging in this virtual visit, you consent to the provision of healthcare and authorize for your insurance to be billed (if applicable) for the services provided during this visit. Depending on your insurance coverage, you may receive a charge related to this service.  I need to obtain your verbal consent now. Are you willing to proceed with your visit today? Tracy Woods has provided verbal consent on 11/20/2024 for a virtual visit (video or telephone). Haze LELON Servant, NP  Date: 11/20/2024 5:18 PM   Virtual Visit via Video Note   I, Haze LELON Servant, connected with  [name]  ([id number], Jul 22, 1971) on 11/20/2024 at  5:00 PM EST by a video-enabled telemedicine application and verified that I am speaking with the correct person using two identifiers.  Location: Patient: Virtual Visit Location  Patient: Home Provider: Virtual Visit Location Provider: Home Office   I discussed the limitations of evaluation and management by telemedicine and the availability of in person appointments. The patient expressed understanding and agreed to proceed.    History of Present Illness: Tracy Woods is a 54 y.o. who identifies as a female who was assigned female at birth, and is being seen today for recurrent UTI symptoms.  Ms Tindall has been experiencing symptoms of recurrent UTI that include dysuria and change in appearance and smell of urine.  This is her fourth occurrence of UTI symptoms in the past 4 months and she has been treated with doxycycline , Macrobid  and Bactrim  stating symptoms have never resolved since initially being treated.  I have instructed her at this time that we cannot send any additional antibiotics until she has a urine culture and she was very upset stating she is unable to leave her home due to mobility issues.  There is also a note in my chart from her PCP stating she needs to come into the office for a urine sample.  Problems:  Patient Active Problem List   Diagnosis Date Noted   Morbid obesity (HCC) 09/06/2024   Sleep difficulties 09/06/2024   History of abdominal wall necrotizing fasciitis 09/2022 10/05/2023   Ventral hernia without obstruction or gangrene 10/05/2023   Cellulitis of abdominal wall 10/05/2023   Adenomatous polyp of colon 09/02/2023   LLQ abdominal pain 09/02/2023   Ingrown toenail of left foot 07/29/2023   Upslanting toenails 07/29/2023  Urinary frequency 07/29/2023   Elevated cholesterol 06/26/2023   Bilateral lower abdominal discomfort 06/26/2023   Urinary problem in female 06/26/2023   Candidiasis 06/26/2023   Other chronic pain 05/30/2023   Abdominal pain 05/29/2023   Morbid obesity with BMI of 50.0-59.9, adult (HCC) 05/10/2023   Type 2 diabetes mellitus with other specified complication (HCC) 05/10/2023   Dyspnea 05/10/2023    Anxiety and depression 05/10/2023   Neuropathy 05/10/2023   Clinical diagnosis of COVID-19 11/25/2022   Aftercare following surgery of the skin or subcutaneous tissue 11/18/2022   Anxiety 11/18/2022   Candidal intertrigo 11/18/2022   Colon cancer screening 11/18/2022   History of urinary tract infection 11/18/2022   Pseudomonas (aeruginosa) (mallei) (pseudomallei) as the cause of diseases classified elsewhere 11/18/2022   Necrotizing fasciitis (HCC) 10/24/2022   Severe sepsis with lactic acidosis (HCC) 09/15/2022    Allergies: Allergies[1] Medications: Current Medications[2]  Observations/Objective: Patient is well-developed, well-nourished in no acute distress.  Resting comfortably at home.  Head is normocephalic, atraumatic.  No labored breathing.  Speech is clear and coherent with logical content.  Patient is alert and oriented at baseline.    Assessment and Plan: 1. Painful urination (Primary) Needs to be seen in person for urine culture and urinalysis complete  Follow Up Instructions: I discussed the assessment and treatment plan with the patient. The patient was provided an opportunity to ask questions and all were answered. The patient agreed with the plan and demonstrated an understanding of the instructions.  A copy of instructions were sent to the patient via MyChart unless otherwise noted below.     The patient was advised to call back or seek an in-person evaluation if the symptoms worsen or if the condition fails to improve as anticipated.    Haze LELON Servant, NP     [1]  Allergies Allergen Reactions   Amoxicillin Rash    Tolerated Zosyn  09/16/22 with no apparent issue  [2]  Current Outpatient Medications:    acetaminophen  (TYLENOL ) 500 MG tablet, Take 1 tablet (500 mg total) by mouth every 8 (eight) hours as needed., Disp: 30 tablet, Rfl: 0   Blood Pressure Monitoring (BLOOD PRESSURE KIT) DEVI, Measure BP at home daily, Disp: 1 each, Rfl: 0   cyanocobalamin   (VITAMIN B12) 500 MCG tablet, Take 1 tablet (500 mcg total) by mouth daily., Disp: 30 tablet, Rfl: 12   erythromycin  ophthalmic ointment, Place 1 Application into the left eye at bedtime., Disp: 3.5 g, Rfl: 0   gabapentin  (NEURONTIN ) 300 MG capsule, Take 1 capsule (300 mg total) by mouth at bedtime., Disp: 90 capsule, Rfl: 3   HYDROcodone -acetaminophen  (NORCO/VICODIN) 5-325 MG tablet, Take 1 tablet by mouth every 6 (six) hours as needed for moderate pain (pain score 4-6)., Disp: 20 tablet, Rfl: 0   hydrOXYzine  (VISTARIL ) 50 MG capsule, Take 1 capsule (50 mg total) by mouth 3 (three) times daily as needed., Disp: 270 capsule, Rfl: 0   metFORMIN  (GLUCOPHAGE ) 500 MG tablet, Take by mouth daily with breakfast., Disp: , Rfl:    mupirocin  ointment (BACTROBAN ) 2 %, Apply 1 Application topically 2 (two) times daily., Disp: 22 g, Rfl: 1   rosuvastatin  (CRESTOR ) 5 MG tablet, Take 1 tablet (5 mg total) by mouth daily., Disp: 90 tablet, Rfl: 3   Semaglutide ,0.25 or 0.5MG /DOS, (OZEMPIC , 0.25 OR 0.5 MG/DOSE,) 2 MG/3ML SOPN, Initial, 0.25 mg subQ once weekly for 4 weeks, then increase to 0.5 mg once weekly for 4 weeks, then increase to maintenance dose of 1  mg once weekly, Disp: 3 mL, Rfl: 1   triamcinolone  cream (KENALOG ) 0.1 %, Apply topically 2 (two) times daily., Disp: 30 g, Rfl: 0   valsartan -hydrochlorothiazide  (DIOVAN -HCT) 80-12.5 MG tablet, Take 1 tablet by mouth daily., Disp: 90 tablet, Rfl: 3  "

## 2024-12-02 ENCOUNTER — Telehealth: Payer: Self-pay

## 2024-12-06 ENCOUNTER — Ambulatory Visit: Admitting: Physician Assistant

## 2024-12-07 ENCOUNTER — Ambulatory Visit: Admitting: Physician Assistant

## 2024-12-13 ENCOUNTER — Ambulatory Visit: Admitting: Physician Assistant

## 2024-12-13 DIAGNOSIS — G479 Sleep disorder, unspecified: Secondary | ICD-10-CM

## 2024-12-13 DIAGNOSIS — Z1231 Encounter for screening mammogram for malignant neoplasm of breast: Secondary | ICD-10-CM

## 2024-12-13 DIAGNOSIS — E7800 Pure hypercholesterolemia, unspecified: Secondary | ICD-10-CM

## 2024-12-13 DIAGNOSIS — Z1159 Encounter for screening for other viral diseases: Secondary | ICD-10-CM

## 2024-12-13 DIAGNOSIS — G629 Polyneuropathy, unspecified: Secondary | ICD-10-CM

## 2024-12-13 DIAGNOSIS — Z23 Encounter for immunization: Secondary | ICD-10-CM

## 2024-12-13 DIAGNOSIS — E1169 Type 2 diabetes mellitus with other specified complication: Secondary | ICD-10-CM

## 2024-12-16 ENCOUNTER — Ambulatory Visit: Admitting: Physician Assistant

## 2024-12-16 ENCOUNTER — Other Ambulatory Visit: Payer: Self-pay | Admitting: Physician Assistant

## 2024-12-16 ENCOUNTER — Encounter: Payer: Self-pay | Admitting: Physician Assistant

## 2024-12-16 VITALS — BP 173/63 | HR 102 | Resp 12 | Wt >= 6400 oz

## 2024-12-16 DIAGNOSIS — R399 Unspecified symptoms and signs involving the genitourinary system: Secondary | ICD-10-CM

## 2024-12-16 DIAGNOSIS — E7800 Pure hypercholesterolemia, unspecified: Secondary | ICD-10-CM

## 2024-12-16 DIAGNOSIS — G629 Polyneuropathy, unspecified: Secondary | ICD-10-CM

## 2024-12-16 DIAGNOSIS — Z1231 Encounter for screening mammogram for malignant neoplasm of breast: Secondary | ICD-10-CM

## 2024-12-16 DIAGNOSIS — E1169 Type 2 diabetes mellitus with other specified complication: Secondary | ICD-10-CM

## 2024-12-16 DIAGNOSIS — E1159 Type 2 diabetes mellitus with other circulatory complications: Secondary | ICD-10-CM

## 2024-12-16 DIAGNOSIS — G479 Sleep disorder, unspecified: Secondary | ICD-10-CM

## 2024-12-16 DIAGNOSIS — Z23 Encounter for immunization: Secondary | ICD-10-CM

## 2024-12-16 DIAGNOSIS — Z789 Other specified health status: Secondary | ICD-10-CM

## 2024-12-16 DIAGNOSIS — R5381 Other malaise: Secondary | ICD-10-CM

## 2024-12-16 DIAGNOSIS — Z124 Encounter for screening for malignant neoplasm of cervix: Secondary | ICD-10-CM

## 2024-12-16 DIAGNOSIS — F419 Anxiety disorder, unspecified: Secondary | ICD-10-CM

## 2024-12-16 LAB — POCT URINE DIPSTICK
Bilirubin, UA: NEGATIVE
Blood, UA: NEGATIVE
Glucose, UA: NEGATIVE mg/dL
Ketones, POC UA: NEGATIVE mg/dL
Nitrite, UA: NEGATIVE
POC PROTEIN,UA: 30 — AB
Spec Grav, UA: 1.025
Urobilinogen, UA: 0.2 U/dL
pH, UA: 6

## 2024-12-16 NOTE — Progress Notes (Signed)
 " Established patient visit  Patient: Tracy Woods   DOB: Mar 12, 1971   54 y.o. Female  MRN: [id number] Visit Date: 12/16/2024  Today's healthcare provider: Jolynn Spencer, PA-C   Chief Complaint  Patient presents with   Medical Management of Chronic Issues    Follow-up Morbid Obesity, T2DM  PAtient declined all vaccines   Dysuria   Subjective     HPI     Medical Management of Chronic Issues    Additional comments: Follow-up Morbid Obesity, T2DM  PAtient declined all vaccines      Last edited by Rosas, Joseline E, CMA on 12/16/2024  1:20 PM.       Discussed the use of AI scribe software for clinical note transcription with the patient, who gave verbal consent to proceed.  History of Present Illness Tracy Woods is a 54 year old female with recurrent urinary tract infections who presents with persistent symptoms despite antibiotic treatment.  She has had UTI symptoms since before Christmas that have not resolved after two antibiotic courses prescribed via video visit without urine culture. She had a UTI in October that resolved with ER treatment after a culture. She has not obtained in-person care for the current infection due to significant mobility limitations.  She has severe difficulty ambulating and is afraid to leave the house because of mobility. She has regained weight lost during a hospitalization two years ago and is now at a similar weight, which worsens mobility and makes positioning in bed difficult.  She has type 2 diabetes and was prescribed Ozempic  previously but did not start it due to fear of the medication. She is now interested in starting a GLP-1 medication again, and notes her insurance has covered Ozempic  for diabetes management.  She had Bell's palsy over a year ago with partial recovery. She can now open her mouth and close her eyes, but her facial function has plateaued without recent change.  She denies heat or cold intolerance and reports  normal bowel movements with occasional mild constipation. She notes rapid heart beating and shortness of breath with movement. She denies new vision changes. She reports difficulty using a bed because of her weight and a large abdominal apron, which she feels affects circulation. She denies thyroid  disease.       12/16/2024    1:14 PM 01/20/2024    2:41 PM 12/12/2023    1:08 PM  Depression screen PHQ 2/9  Decreased Interest 3 2 1   Down, Depressed, Hopeless 1 1 1   PHQ - 2 Score 4 3 2   Altered sleeping 2 2 2   Tired, decreased energy 3 2 2   Change in appetite 1 1 0  Feeling bad or failure about yourself  1 1 1   Trouble concentrating 0 0 1  Moving slowly or fidgety/restless 0 0 0  Suicidal thoughts 0 0 0  PHQ-9 Score 11 9  8    Difficult doing work/chores Somewhat difficult Somewhat difficult Somewhat difficult     Data saved with a previous flowsheet row definition      12/16/2024    1:14 PM 01/20/2024    2:41 PM 12/12/2023    1:08 PM 07/28/2023    2:12 PM  GAD 7 : Generalized Anxiety Score  Nervous, Anxious, on Edge 2 2  3  2    Control/stop worrying 1 2  3  2    Worry too much - different things 1 2  3  2    Trouble relaxing 2 2  3   1  Restless 0 0  0  1   Easily annoyed or irritable 1 2  2  1    Afraid - awful might happen 1 2  1  1    Total GAD 7 Score 8 12 15 10   Anxiety Difficulty Somewhat difficult Somewhat difficult Somewhat difficult      Data saved with a previous flowsheet row definition    Medications: Show/hide medication list[1]  Review of Systems All negative Except see HPI       Objective    BP (!) 173/63 (BP Location: Left Wrist, Patient Position: Sitting, Cuff Size: Large)   Pulse (!) 102   Resp 12   Wt (!) 432 lb 11.2 oz (196.3 kg)   SpO2 96%   BMI 69.84 kg/m     Physical Exam Vitals reviewed.  Constitutional:      General: She is not in acute distress.    Appearance: Normal appearance. She is well-developed. She is obese. She is not  diaphoretic.  HENT:     Head: Normocephalic and atraumatic.  Eyes:     General: No scleral icterus.    Conjunctiva/sclera: Conjunctivae normal.  Neck:     Thyroid : No thyromegaly.  Cardiovascular:     Rate and Rhythm: Normal rate and regular rhythm.     Pulses: Normal pulses.     Heart sounds: Normal heart sounds. No murmur heard. Pulmonary:     Effort: Pulmonary effort is normal. No respiratory distress.     Breath sounds: Normal breath sounds. No wheezing, rhonchi or rales.  Musculoskeletal:     Cervical back: Neck supple.     Right lower leg: No edema.     Left lower leg: No edema.  Lymphadenopathy:     Cervical: No cervical adenopathy.  Skin:    General: Skin is warm and dry.     Findings: No rash.  Neurological:     Mental Status: She is alert and oriented to person, place, and time. Mental status is at baseline.  Psychiatric:        Mood and Affect: Mood normal.        Behavior: Behavior normal.      Results for orders placed or performed in visit on 12/16/24  POCT URINE DIPSTICK  Result Value Ref Range   Color, UA yellow yellow   Clarity, UA clear clear   Glucose, UA negative negative mg/dL   Bilirubin, UA negative negative   Ketones, POC UA negative negative mg/dL   Spec Grav, UA 8.974 8.989 - 1.025   Blood, UA negative negative   pH, UA 6.0 5.0 - 8.0   POC PROTEIN,UA =30 (A) negative, trace   Urobilinogen, UA 0.2 0.2 or 1.0 E.U./dL   Nitrite, UA Negative Negative   Leukocytes, UA Trace (A) Negative        Assessment and Plan Assessment & Plan Recurrent urinary tract infection Persistent symptoms despite antibiotics. Concern for underlying issues. - Ordered urine culture and microscopy. - Consider urology referral if recurrent UTIs persist.  Type 2 diabetes mellitus with hypertension Chronic and unstable Last A1c 7.1 on 01/20/24 Diabetes managed with metformin  and semaglutide . Hypertension noted. Insurance coverage for GLP-1 agonists is a  concern. - Ordered blood work including A1c. - Discuss GLP-1 therapy options after reviewing lab results. Will reassess after  receiving lab results Continue lifestyle modifications Will follow-up  Morbid obesity with impaired mobility Deconditioning Morbid obesity affects mobility. Interest in GLP-1 therapy for weight management, but insurance coverage is a barrier. -  Discussed GLP-1 therapy options for weight management after reviewing lab results. - Encouraged weight loss to improve mobility and overall health. Continue lifestyle modifications Will follow-up   Type 2 diabetes mellitus with other specified complication, without long-term current use of insulin  (HCC) (Primary)  - Urine Albumin/Creatinine with ratio (send out) [LAB689] - Hemoglobin A1c   Neuropathy Chronic and stable Most likely due to DMII? Continue gabapentin  300 at bedtime Consider a referral to neurology Will follow-up   Hyperlipidemia Chronic and unstable  Last ldl 185  on 01/20/24 Gained weight for the past months Pt needs to restart taking crestor  5mg  - Lipid Panel With LDL/HDL Ratio Will reassess after  receiving lab results Will FU  Anxiety and depression Phq9 and gad7 scoring 11, moderate Continue taking gabapentin  Could be helpful in anxiety and for sleep problems Will follow-up at the follow-up  Encounter for screening mammogram for malignant neoplasm of breast Advised to contact to [facility] center and schedule mammogram Last order was placed in 2025, still active  UTI symptoms Recurrent Workup ordered - POCT URINE DIPSTICK - Urinalysis, microscopic only - Urine Culture Will reassess after  receiving lab results Will FU  Hypertension associated with diabetes (HCC) Chronic and unstable Bp 173/63 Needs to restart taking medication: diovan  80-12.5 Consider increase frequency to bid Workup ordered - CBC w/Diff/Platelet - Urine Albumin/Creatinine with ratio (send out) [LAB689] -  Comprehensive metabolic panel with GFR Will reassess after  receiving lab results Will FU  Morbid obesity (HCC) Chronic and unstable Associated with DMII Workup ordered - TSH - T4, free - Hemoglobin A1c - Lipid Panel With LDL/HDL Ratio - Comprehensive metabolic panel with GFR Weight loss of 5% of pt's current weight via healthy diet and daily exercise encouraged.  Will reassess after  receiving lab results Will FU  Hepatitis B vaccination status unknown - Hepatitis B Surface AntiBODY  Previous lab work pending since 12/09/24 Orders Placed This Encounter  Procedures   Urine Culture   CBC w/Diff/Platelet   Urinalysis, microscopic only   TSH   T4, free   Urine Albumin/Creatinine with ratio (send out) [LAB689]   Hemoglobin A1c   Hepatitis B Surface AntiBODY   Lipid Panel With LDL/HDL Ratio   Comprehensive metabolic panel with GFR   POCT URINE DIPSTICK   HM Diabetes Foot Exam    No follow-ups on file.   The patient was advised to call back or seek an in-person evaluation if the symptoms worsen or if the condition fails to improve as anticipated.  I discussed the assessment and treatment plan with the patient. The patient was provided an opportunity to ask questions and all were answered. The patient agreed with the plan and demonstrated an understanding of the instructions.  I, Sirius Woodford, PA-C have reviewed all documentation for this visit. The documentation on 12/16/2024  for the exam, diagnosis, procedures, and orders are all accurate and complete.  Jolynn Spencer, [facility], MMS [facility] [phone] (phone) [phone] (fax)  [facility]     [1]  Outpatient Medications Prior to Visit  Medication Sig   acetaminophen  (TYLENOL ) 500 MG tablet Take 1 tablet (500 mg total) by mouth every 8 (eight) hours as needed.   gabapentin  (NEURONTIN ) 300 MG capsule Take 1 capsule (300 mg total) by mouth at bedtime.   hydrOXYzine  (VISTARIL ) 50 MG  capsule Take 1 capsule (50 mg total) by mouth 3 (three) times daily as needed.   Blood Pressure Monitoring (BLOOD PRESSURE KIT) DEVI Measure BP at  home daily   cyanocobalamin  (VITAMIN B12) 500 MCG tablet Take 1 tablet (500 mcg total) by mouth daily.   erythromycin  ophthalmic ointment Place 1 Application into the left eye at bedtime.   HYDROcodone -acetaminophen  (NORCO/VICODIN) 5-325 MG tablet Take 1 tablet by mouth every 6 (six) hours as needed for moderate pain (pain score 4-6).   metFORMIN  (GLUCOPHAGE ) 500 MG tablet Take by mouth daily with breakfast.   mupirocin  ointment (BACTROBAN ) 2 % Apply 1 Application topically 2 (two) times daily.   rosuvastatin  (CRESTOR ) 5 MG tablet Take 1 tablet (5 mg total) by mouth daily.   Semaglutide ,0.25 or 0.5MG /DOS, (OZEMPIC , 0.25 OR 0.5 MG/DOSE,) 2 MG/3ML SOPN Initial, 0.25 mg subQ once weekly for 4 weeks, then increase to 0.5 mg once weekly for 4 weeks, then increase to maintenance dose of 1 mg once weekly   triamcinolone  cream (KENALOG ) 0.1 % Apply topically 2 (two) times daily.   valsartan -hydrochlorothiazide  (DIOVAN -HCT) 80-12.5 MG tablet Take 1 tablet by mouth daily.   No facility-administered medications prior to visit.   "

## 2024-12-17 LAB — CBC WITH DIFFERENTIAL/PLATELET
Basophils Absolute: 0.1 10*3/uL (ref 0.0–0.2)
Basos: 1 %
EOS (ABSOLUTE): 0.1 10*3/uL (ref 0.0–0.4)
Eos: 2 %
Hematocrit: 44.6 % (ref 34.0–46.6)
Hemoglobin: 14.9 g/dL (ref 11.1–15.9)
Immature Grans (Abs): 0 10*3/uL (ref 0.0–0.1)
Immature Granulocytes: 0 %
Lymphocytes Absolute: 2.2 10*3/uL (ref 0.7–3.1)
Lymphs: 29 %
MCH: 28.5 pg (ref 26.6–33.0)
MCHC: 33.4 g/dL (ref 31.5–35.7)
MCV: 85 fL (ref 79–97)
Monocytes Absolute: 0.5 10*3/uL (ref 0.1–0.9)
Monocytes: 7 %
Neutrophils Absolute: 4.7 10*3/uL (ref 1.4–7.0)
Neutrophils: 60 %
Platelets: 271 10*3/uL (ref 150–450)
RBC: 5.23 x10E6/uL (ref 3.77–5.28)
RDW: 12.3 % (ref 11.7–15.4)
WBC: 7.6 10*3/uL (ref 3.4–10.8)

## 2024-12-17 LAB — COMPREHENSIVE METABOLIC PANEL WITH GFR
ALT: 34 [IU]/L — ABNORMAL HIGH (ref 0–32)
AST: 46 [IU]/L — ABNORMAL HIGH (ref 0–40)
Albumin: 4.1 g/dL (ref 3.8–4.9)
Alkaline Phosphatase: 104 [IU]/L (ref 49–135)
BUN/Creatinine Ratio: 26 — ABNORMAL HIGH (ref 9–23)
BUN: 19 mg/dL (ref 6–24)
Bilirubin Total: 0.7 mg/dL (ref 0.0–1.2)
CO2: 17 mmol/L — ABNORMAL LOW (ref 20–29)
Calcium: 9.9 mg/dL (ref 8.7–10.2)
Chloride: 101 mmol/L (ref 96–106)
Creatinine, Ser: 0.74 mg/dL (ref 0.57–1.00)
Globulin, Total: 3.4 g/dL (ref 1.5–4.5)
Glucose: 126 mg/dL — ABNORMAL HIGH (ref 70–99)
Potassium: 4.6 mmol/L (ref 3.5–5.2)
Sodium: 138 mmol/L (ref 134–144)
Total Protein: 7.5 g/dL (ref 6.0–8.5)
eGFR: 96 mL/min/{1.73_m2}

## 2024-12-17 LAB — URINE CULTURE

## 2024-12-17 LAB — LIPID PANEL WITH LDL/HDL RATIO
Cholesterol, Total: 276 mg/dL — ABNORMAL HIGH (ref 100–199)
HDL: 65 mg/dL
LDL Chol Calc (NIH): 177 mg/dL — ABNORMAL HIGH (ref 0–99)
LDL/HDL Ratio: 2.7 ratio (ref 0.0–3.2)
Triglycerides: 186 mg/dL — ABNORMAL HIGH (ref 0–149)
VLDL Cholesterol Cal: 34 mg/dL (ref 5–40)

## 2024-12-17 LAB — MICROALBUMIN / CREATININE URINE RATIO
Creatinine, Urine: 152.3 mg/dL
Microalb/Creat Ratio: 121 mg/g{creat} — ABNORMAL HIGH (ref 0–29)
Microalbumin, Urine: 184.2 ug/mL

## 2024-12-17 LAB — URINALYSIS, MICROSCOPIC ONLY
Casts: NONE SEEN /LPF
Epithelial Cells (non renal): >10 /HPF — AB (ref 0–10)
RBC, Urine: NONE SEEN /HPF (ref 0–2)

## 2024-12-17 LAB — HEPATITIS B SURFACE ANTIBODY,QUALITATIVE: Hep B Surface Ab, Qual: NONREACTIVE

## 2024-12-17 LAB — HEMOGLOBIN A1C
Est. average glucose Bld gHb Est-mCnc: 194 mg/dL
Hgb A1c MFr Bld: 8.4 % — ABNORMAL HIGH (ref 4.8–5.6)

## 2024-12-17 LAB — TSH: TSH: 3.45 u[IU]/mL (ref 0.450–4.500)

## 2024-12-17 LAB — T4, FREE: Free T4: 1.23 ng/dL (ref 0.82–1.77)

## 2024-12-17 NOTE — Telephone Encounter (Signed)
 Copied from CRM [id number]. Topic: Clinical - Request for Lab/Test Order >> Dec 17, 2024 10:02 AM Alfonso HERO wrote: Reason for CRM: patient calling because she is wanting a culture on her urine and asking for a call back.

## 2024-12-17 NOTE — Telephone Encounter (Signed)
 Noted.

## 2024-12-23 ENCOUNTER — Ambulatory Visit: Admitting: Physician Assistant

## 2024-12-24 ENCOUNTER — Telehealth

## 2025-01-18 ENCOUNTER — Ambulatory Visit: Admitting: Physician Assistant

## 2025-02-02 ENCOUNTER — Ambulatory Visit: Admitting: Physician Assistant
